# Patient Record
Sex: Female | Born: 1940 | Race: White | Hispanic: No | Marital: Married | Smoking: Never smoker
Health system: Southern US, Community
[De-identification: ages and names within clinical notes are randomized; demographics above are authoritative.]

## PROBLEM LIST (undated history)

## (undated) DIAGNOSIS — N189 Chronic kidney disease, unspecified: Secondary | ICD-10-CM

## (undated) DIAGNOSIS — Z8781 Personal history of (healed) traumatic fracture: Secondary | ICD-10-CM

## (undated) DIAGNOSIS — I509 Heart failure, unspecified: Secondary | ICD-10-CM

## (undated) DIAGNOSIS — I671 Cerebral aneurysm, nonruptured: Secondary | ICD-10-CM

## (undated) HISTORY — PX: TUBAL LIGATION: SHX77

---

## 2004-10-17 ENCOUNTER — Emergency Department: Payer: Self-pay | Admitting: Emergency Medicine

## 2004-10-17 ENCOUNTER — Other Ambulatory Visit: Payer: Self-pay

## 2012-09-27 DIAGNOSIS — S61409A Unspecified open wound of unspecified hand, initial encounter: Secondary | ICD-10-CM | POA: Diagnosis not present

## 2012-09-27 DIAGNOSIS — Z23 Encounter for immunization: Secondary | ICD-10-CM | POA: Diagnosis not present

## 2012-12-27 DIAGNOSIS — J069 Acute upper respiratory infection, unspecified: Secondary | ICD-10-CM | POA: Diagnosis not present

## 2012-12-27 DIAGNOSIS — Z23 Encounter for immunization: Secondary | ICD-10-CM | POA: Diagnosis not present

## 2012-12-27 DIAGNOSIS — B079 Viral wart, unspecified: Secondary | ICD-10-CM | POA: Diagnosis not present

## 2013-10-27 DIAGNOSIS — S335XXA Sprain of ligaments of lumbar spine, initial encounter: Secondary | ICD-10-CM | POA: Diagnosis not present

## 2013-10-27 DIAGNOSIS — Z23 Encounter for immunization: Secondary | ICD-10-CM | POA: Diagnosis not present

## 2013-10-27 DIAGNOSIS — L988 Other specified disorders of the skin and subcutaneous tissue: Secondary | ICD-10-CM | POA: Diagnosis not present

## 2013-11-01 DIAGNOSIS — M999 Biomechanical lesion, unspecified: Secondary | ICD-10-CM | POA: Diagnosis not present

## 2013-11-01 DIAGNOSIS — M5430 Sciatica, unspecified side: Secondary | ICD-10-CM | POA: Diagnosis not present

## 2013-11-02 DIAGNOSIS — M999 Biomechanical lesion, unspecified: Secondary | ICD-10-CM | POA: Diagnosis not present

## 2013-11-02 DIAGNOSIS — M5430 Sciatica, unspecified side: Secondary | ICD-10-CM | POA: Diagnosis not present

## 2013-11-03 DIAGNOSIS — M5430 Sciatica, unspecified side: Secondary | ICD-10-CM | POA: Diagnosis not present

## 2013-11-03 DIAGNOSIS — M999 Biomechanical lesion, unspecified: Secondary | ICD-10-CM | POA: Diagnosis not present

## 2013-11-04 DIAGNOSIS — M999 Biomechanical lesion, unspecified: Secondary | ICD-10-CM | POA: Diagnosis not present

## 2013-11-04 DIAGNOSIS — M5430 Sciatica, unspecified side: Secondary | ICD-10-CM | POA: Diagnosis not present

## 2013-11-07 DIAGNOSIS — M5430 Sciatica, unspecified side: Secondary | ICD-10-CM | POA: Diagnosis not present

## 2013-11-07 DIAGNOSIS — M999 Biomechanical lesion, unspecified: Secondary | ICD-10-CM | POA: Diagnosis not present

## 2013-11-09 DIAGNOSIS — M999 Biomechanical lesion, unspecified: Secondary | ICD-10-CM | POA: Diagnosis not present

## 2013-11-09 DIAGNOSIS — M5430 Sciatica, unspecified side: Secondary | ICD-10-CM | POA: Diagnosis not present

## 2013-11-14 DIAGNOSIS — M999 Biomechanical lesion, unspecified: Secondary | ICD-10-CM | POA: Diagnosis not present

## 2013-11-14 DIAGNOSIS — M5430 Sciatica, unspecified side: Secondary | ICD-10-CM | POA: Diagnosis not present

## 2013-11-21 DIAGNOSIS — M999 Biomechanical lesion, unspecified: Secondary | ICD-10-CM | POA: Diagnosis not present

## 2013-11-21 DIAGNOSIS — M5430 Sciatica, unspecified side: Secondary | ICD-10-CM | POA: Diagnosis not present

## 2013-12-09 DIAGNOSIS — M5430 Sciatica, unspecified side: Secondary | ICD-10-CM | POA: Diagnosis not present

## 2013-12-09 DIAGNOSIS — M999 Biomechanical lesion, unspecified: Secondary | ICD-10-CM | POA: Diagnosis not present

## 2015-09-24 ENCOUNTER — Encounter: Payer: Self-pay | Admitting: Emergency Medicine

## 2015-09-24 ENCOUNTER — Emergency Department: Payer: No Typology Code available for payment source

## 2015-09-24 ENCOUNTER — Emergency Department
Admission: EM | Admit: 2015-09-24 | Discharge: 2015-09-24 | Payer: No Typology Code available for payment source | Attending: Emergency Medicine | Admitting: Emergency Medicine

## 2015-09-24 DIAGNOSIS — S0990XA Unspecified injury of head, initial encounter: Secondary | ICD-10-CM | POA: Diagnosis not present

## 2015-09-24 DIAGNOSIS — S51812A Laceration without foreign body of left forearm, initial encounter: Secondary | ICD-10-CM | POA: Diagnosis not present

## 2015-09-24 DIAGNOSIS — S12100A Unspecified displaced fracture of second cervical vertebra, initial encounter for closed fracture: Secondary | ICD-10-CM | POA: Insufficient documentation

## 2015-09-24 DIAGNOSIS — S299XXA Unspecified injury of thorax, initial encounter: Secondary | ICD-10-CM | POA: Diagnosis not present

## 2015-09-24 DIAGNOSIS — Y92410 Unspecified street and highway as the place of occurrence of the external cause: Secondary | ICD-10-CM | POA: Diagnosis not present

## 2015-09-24 DIAGNOSIS — Y33XXXA Other specified events, undetermined intent, initial encounter: Secondary | ICD-10-CM | POA: Diagnosis not present

## 2015-09-24 DIAGNOSIS — Z7401 Bed confinement status: Secondary | ICD-10-CM | POA: Diagnosis not present

## 2015-09-24 DIAGNOSIS — Z881 Allergy status to other antibiotic agents status: Secondary | ICD-10-CM | POA: Diagnosis not present

## 2015-09-24 DIAGNOSIS — Y939 Activity, unspecified: Secondary | ICD-10-CM | POA: Insufficient documentation

## 2015-09-24 DIAGNOSIS — M79632 Pain in left forearm: Secondary | ICD-10-CM | POA: Diagnosis not present

## 2015-09-24 DIAGNOSIS — S62502A Fracture of unspecified phalanx of left thumb, initial encounter for closed fracture: Secondary | ICD-10-CM | POA: Diagnosis not present

## 2015-09-24 DIAGNOSIS — Z885 Allergy status to narcotic agent status: Secondary | ICD-10-CM | POA: Diagnosis not present

## 2015-09-24 DIAGNOSIS — M79631 Pain in right forearm: Secondary | ICD-10-CM | POA: Diagnosis not present

## 2015-09-24 DIAGNOSIS — T1490 Injury, unspecified: Secondary | ICD-10-CM | POA: Diagnosis not present

## 2015-09-24 DIAGNOSIS — S129XXA Fracture of neck, unspecified, initial encounter: Secondary | ICD-10-CM | POA: Diagnosis not present

## 2015-09-24 DIAGNOSIS — M79644 Pain in right finger(s): Secondary | ICD-10-CM | POA: Diagnosis present

## 2015-09-24 DIAGNOSIS — Y999 Unspecified external cause status: Secondary | ICD-10-CM | POA: Insufficient documentation

## 2015-09-24 DIAGNOSIS — S12200A Unspecified displaced fracture of third cervical vertebra, initial encounter for closed fracture: Secondary | ICD-10-CM | POA: Diagnosis not present

## 2015-09-24 DIAGNOSIS — S12120A Other displaced dens fracture, initial encounter for closed fracture: Secondary | ICD-10-CM

## 2015-09-24 DIAGNOSIS — S59912A Unspecified injury of left forearm, initial encounter: Secondary | ICD-10-CM | POA: Diagnosis not present

## 2015-09-24 DIAGNOSIS — S62619A Displaced fracture of proximal phalanx of unspecified finger, initial encounter for closed fracture: Secondary | ICD-10-CM

## 2015-09-24 DIAGNOSIS — S6992XA Unspecified injury of left wrist, hand and finger(s), initial encounter: Secondary | ICD-10-CM | POA: Diagnosis not present

## 2015-09-24 DIAGNOSIS — R0902 Hypoxemia: Secondary | ICD-10-CM | POA: Diagnosis not present

## 2015-09-24 DIAGNOSIS — M542 Cervicalgia: Secondary | ICD-10-CM | POA: Diagnosis not present

## 2015-09-24 DIAGNOSIS — Z9181 History of falling: Secondary | ICD-10-CM | POA: Diagnosis not present

## 2015-09-24 DIAGNOSIS — R51 Headache: Secondary | ICD-10-CM | POA: Diagnosis not present

## 2015-09-24 DIAGNOSIS — S62512A Displaced fracture of proximal phalanx of left thumb, initial encounter for closed fracture: Secondary | ICD-10-CM | POA: Diagnosis not present

## 2015-09-24 DIAGNOSIS — S59911A Unspecified injury of right forearm, initial encounter: Secondary | ICD-10-CM | POA: Diagnosis not present

## 2015-09-24 DIAGNOSIS — S12110A Anterior displaced Type II dens fracture, initial encounter for closed fracture: Secondary | ICD-10-CM | POA: Diagnosis not present

## 2015-09-24 DIAGNOSIS — Z880 Allergy status to penicillin: Secondary | ICD-10-CM | POA: Diagnosis not present

## 2015-09-24 DIAGNOSIS — R079 Chest pain, unspecified: Secondary | ICD-10-CM | POA: Diagnosis not present

## 2015-09-24 LAB — PROTIME-INR
INR: 1.02
PROTHROMBIN TIME: 13.6 s (ref 11.4–15.0)

## 2015-09-24 LAB — CBC
HCT: 39.7 % (ref 35.0–47.0)
HEMOGLOBIN: 14 g/dL (ref 12.0–16.0)
MCH: 33 pg (ref 26.0–34.0)
MCHC: 35.1 g/dL (ref 32.0–36.0)
MCV: 94.1 fL (ref 80.0–100.0)
Platelets: 182 10*3/uL (ref 150–440)
RBC: 4.23 MIL/uL (ref 3.80–5.20)
RDW: 13.7 % (ref 11.5–14.5)
WBC: 10.3 10*3/uL (ref 3.6–11.0)

## 2015-09-24 LAB — APTT: APTT: 28 s (ref 24–36)

## 2015-09-24 LAB — BASIC METABOLIC PANEL
ANION GAP: 8 (ref 5–15)
BUN: 11 mg/dL (ref 6–20)
CHLORIDE: 108 mmol/L (ref 101–111)
CO2: 23 mmol/L (ref 22–32)
Calcium: 8.8 mg/dL — ABNORMAL LOW (ref 8.9–10.3)
Creatinine, Ser: 0.84 mg/dL (ref 0.44–1.00)
Glucose, Bld: 115 mg/dL — ABNORMAL HIGH (ref 65–99)
POTASSIUM: 3.5 mmol/L (ref 3.5–5.1)
SODIUM: 139 mmol/L (ref 135–145)

## 2015-09-24 MED ORDER — TRIPLE ANTIBIOTIC 3.5-400-5000 EX OINT
TOPICAL_OINTMENT | Freq: Once | CUTANEOUS | Status: DC
  Filled 2015-09-24: qty 15

## 2015-09-24 MED ORDER — LIDOCAINE HCL (PF) 1 % IJ SOLN
5.0000 mL | Freq: Once | INTRAMUSCULAR | Status: AC
  Administered 2015-09-24: 5 mL

## 2015-09-24 MED ORDER — LACTATED RINGERS IV SOLN
INTRAVENOUS | Status: DC
  Administered 2015-09-24: 15:00:00 via INTRAVENOUS

## 2015-09-24 MED ORDER — BACITRACIN ZINC 500 UNIT/GM EX OINT
TOPICAL_OINTMENT | CUTANEOUS | Status: AC
  Filled 2015-09-24: qty 2.7

## 2015-09-24 MED ORDER — LIDOCAINE HCL (PF) 1 % IJ SOLN
INTRAMUSCULAR | Status: AC
  Administered 2015-09-24: 5 mL
  Filled 2015-09-24: qty 5

## 2015-09-24 MED ORDER — TETANUS-DIPHTH-ACELL PERTUSSIS 5-2.5-18.5 LF-MCG/0.5 IM SUSP
0.5000 mL | Freq: Once | INTRAMUSCULAR | Status: AC
  Administered 2015-09-24: 0.5 mL via INTRAMUSCULAR
  Filled 2015-09-24: qty 0.5

## 2015-09-24 MED ORDER — BACITRACIN ZINC 500 UNIT/GM EX OINT
TOPICAL_OINTMENT | Freq: Two times a day (BID) | CUTANEOUS | Status: DC
  Administered 2015-09-24: 13:00:00 via TOPICAL

## 2015-09-24 NOTE — ED Notes (Signed)
Pt placed on backboard.
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
## 2015-09-24 NOTE — ED Notes (Signed)
Placed pt on bed pan to urinate with the assistance of Estate agent.

## 2015-09-24 NOTE — ED Notes (Signed)
Pt placed in c-collar
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
## 2015-09-25 DIAGNOSIS — M47814 Spondylosis without myelopathy or radiculopathy, thoracic region: Secondary | ICD-10-CM | POA: Diagnosis not present

## 2015-09-25 DIAGNOSIS — S299XXA Unspecified injury of thorax, initial encounter: Secondary | ICD-10-CM | POA: Diagnosis not present

## 2015-09-28 ENCOUNTER — Telehealth: Payer: Self-pay | Admitting: Family Medicine

## 2015-09-28 DIAGNOSIS — S12120D Other displaced dens fracture, subsequent encounter for fracture with routine healing: Secondary | ICD-10-CM | POA: Diagnosis not present

## 2015-09-28 DIAGNOSIS — M6281 Muscle weakness (generalized): Secondary | ICD-10-CM | POA: Diagnosis not present

## 2015-09-28 DIAGNOSIS — S62502D Fracture of unspecified phalanx of left thumb, subsequent encounter for fracture with routine healing: Secondary | ICD-10-CM | POA: Diagnosis not present

## 2015-09-28 DIAGNOSIS — Z9181 History of falling: Secondary | ICD-10-CM | POA: Diagnosis not present
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
## 2015-09-28 NOTE — Telephone Encounter (Signed)
Please review below, how would you like to proceed.-aa

## 2015-10-01 DIAGNOSIS — Z9181 History of falling: Secondary | ICD-10-CM | POA: Diagnosis not present

## 2015-10-01 DIAGNOSIS — M6281 Muscle weakness (generalized): Secondary | ICD-10-CM | POA: Diagnosis not present

## 2015-10-01 DIAGNOSIS — S62502D Fracture of unspecified phalanx of left thumb, subsequent encounter for fracture with routine healing: Secondary | ICD-10-CM | POA: Diagnosis not present

## 2015-10-01 DIAGNOSIS — S12120D Other displaced dens fracture, subsequent encounter for fracture with routine healing: Secondary | ICD-10-CM | POA: Diagnosis not present

## 2015-10-03 ENCOUNTER — Telehealth: Payer: Self-pay | Admitting: Family Medicine

## 2015-10-03 NOTE — Telephone Encounter (Signed)
She will have to be seen by someone else. I can't not taking any more new patients.
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
## 2015-10-03 NOTE — Telephone Encounter (Signed)
[name] advised. sd

## 2015-10-04 ENCOUNTER — Inpatient Hospital Stay: Payer: Self-pay | Admitting: Family Medicine

## 2015-10-04 DIAGNOSIS — Z9181 History of falling: Secondary | ICD-10-CM | POA: Diagnosis not present

## 2015-10-04 DIAGNOSIS — S62502D Fracture of unspecified phalanx of left thumb, subsequent encounter for fracture with routine healing: Secondary | ICD-10-CM | POA: Diagnosis not present

## 2015-10-04 DIAGNOSIS — M6281 Muscle weakness (generalized): Secondary | ICD-10-CM | POA: Diagnosis not present

## 2015-10-04 DIAGNOSIS — S12120D Other displaced dens fracture, subsequent encounter for fracture with routine healing: Secondary | ICD-10-CM | POA: Diagnosis not present

## 2015-10-05 DIAGNOSIS — S62512A Displaced fracture of proximal phalanx of left thumb, initial encounter for closed fracture: Secondary | ICD-10-CM | POA: Diagnosis not present

## 2015-10-05 DIAGNOSIS — Y33XXXA Other specified events, undetermined intent, initial encounter: Secondary | ICD-10-CM | POA: Diagnosis not present

## 2015-10-05 DIAGNOSIS — M79645 Pain in left finger(s): Secondary | ICD-10-CM | POA: Diagnosis not present

## 2015-10-08 DIAGNOSIS — R0602 Shortness of breath: Secondary | ICD-10-CM | POA: Diagnosis not present

## 2015-10-08 DIAGNOSIS — Z01818 Encounter for other preprocedural examination: Secondary | ICD-10-CM | POA: Diagnosis not present

## 2015-10-08 DIAGNOSIS — S62512A Displaced fracture of proximal phalanx of left thumb, initial encounter for closed fracture: Secondary | ICD-10-CM | POA: Diagnosis not present

## 2015-10-08 DIAGNOSIS — S12120S Other displaced dens fracture, sequela: Secondary | ICD-10-CM | POA: Diagnosis not present

## 2015-10-08 DIAGNOSIS — R0902 Hypoxemia: Secondary | ICD-10-CM | POA: Diagnosis not present

## 2015-10-08 DIAGNOSIS — E669 Obesity, unspecified: Secondary | ICD-10-CM | POA: Diagnosis not present

## 2015-10-08 DIAGNOSIS — G4730 Sleep apnea, unspecified: Secondary | ICD-10-CM | POA: Diagnosis not present

## 2015-10-09 DIAGNOSIS — M6281 Muscle weakness (generalized): Secondary | ICD-10-CM | POA: Diagnosis not present

## 2015-10-09 DIAGNOSIS — Z9181 History of falling: Secondary | ICD-10-CM | POA: Diagnosis not present

## 2015-10-09 DIAGNOSIS — S12120D Other displaced dens fracture, subsequent encounter for fracture with routine healing: Secondary | ICD-10-CM | POA: Diagnosis not present

## 2015-10-09 DIAGNOSIS — S62502D Fracture of unspecified phalanx of left thumb, subsequent encounter for fracture with routine healing: Secondary | ICD-10-CM | POA: Diagnosis not present

## 2015-10-10 DIAGNOSIS — M542 Cervicalgia: Secondary | ICD-10-CM | POA: Diagnosis not present

## 2015-10-10 DIAGNOSIS — Y33XXXS Other specified events, undetermined intent, sequela: Secondary | ICD-10-CM | POA: Diagnosis not present

## 2015-10-10 DIAGNOSIS — S12120S Other displaced dens fracture, sequela: Secondary | ICD-10-CM | POA: Diagnosis not present

## 2015-10-10 DIAGNOSIS — S12121D Other nondisplaced dens fracture, subsequent encounter for fracture with routine healing: Secondary | ICD-10-CM | POA: Diagnosis not present

## 2015-10-10 DIAGNOSIS — S12120D Other displaced dens fracture, subsequent encounter for fracture with routine healing: Secondary | ICD-10-CM | POA: Diagnosis not present

## 2015-10-10 DIAGNOSIS — M47812 Spondylosis without myelopathy or radiculopathy, cervical region: Secondary | ICD-10-CM | POA: Diagnosis not present

## 2015-10-10 DIAGNOSIS — Z87891 Personal history of nicotine dependence: Secondary | ICD-10-CM | POA: Diagnosis not present

## 2015-10-11 DIAGNOSIS — S62502D Fracture of unspecified phalanx of left thumb, subsequent encounter for fracture with routine healing: Secondary | ICD-10-CM | POA: Diagnosis not present

## 2015-10-11 DIAGNOSIS — S12120D Other displaced dens fracture, subsequent encounter for fracture with routine healing: Secondary | ICD-10-CM | POA: Diagnosis not present

## 2015-10-11 DIAGNOSIS — Z9181 History of falling: Secondary | ICD-10-CM | POA: Diagnosis not present

## 2015-10-11 DIAGNOSIS — M6281 Muscle weakness (generalized): Secondary | ICD-10-CM | POA: Diagnosis not present

## 2015-10-12 ENCOUNTER — Encounter (INDEPENDENT_AMBULATORY_CARE_PROVIDER_SITE_OTHER): Payer: Medicare Other | Admitting: Family Medicine

## 2015-10-12 DIAGNOSIS — M6281 Muscle weakness (generalized): Secondary | ICD-10-CM | POA: Diagnosis not present

## 2015-10-12 DIAGNOSIS — Z9181 History of falling: Secondary | ICD-10-CM | POA: Diagnosis not present

## 2015-10-12 DIAGNOSIS — Z01818 Encounter for other preprocedural examination: Secondary | ICD-10-CM | POA: Diagnosis not present

## 2015-10-12 DIAGNOSIS — S62502D Fracture of unspecified phalanx of left thumb, subsequent encounter for fracture with routine healing: Secondary | ICD-10-CM

## 2015-10-12 DIAGNOSIS — R0602 Shortness of breath: Secondary | ICD-10-CM | POA: Diagnosis not present

## 2015-10-16 DIAGNOSIS — S62512A Displaced fracture of proximal phalanx of left thumb, initial encounter for closed fracture: Secondary | ICD-10-CM | POA: Diagnosis not present

## 2015-10-16 DIAGNOSIS — Z87891 Personal history of nicotine dependence: Secondary | ICD-10-CM | POA: Diagnosis not present

## 2015-10-16 DIAGNOSIS — Z7982 Long term (current) use of aspirin: Secondary | ICD-10-CM | POA: Diagnosis not present

## 2015-10-16 DIAGNOSIS — Z79899 Other long term (current) drug therapy: Secondary | ICD-10-CM | POA: Diagnosis not present

## 2015-10-29 DIAGNOSIS — Z4789 Encounter for other orthopedic aftercare: Secondary | ICD-10-CM | POA: Diagnosis not present

## 2015-10-29 DIAGNOSIS — M25642 Stiffness of left hand, not elsewhere classified: Secondary | ICD-10-CM | POA: Diagnosis not present

## 2015-10-29 DIAGNOSIS — Y33XXXD Other specified events, undetermined intent, subsequent encounter: Secondary | ICD-10-CM | POA: Diagnosis not present

## 2015-10-29 DIAGNOSIS — S62512D Displaced fracture of proximal phalanx of left thumb, subsequent encounter for fracture with routine healing: Secondary | ICD-10-CM | POA: Diagnosis not present

## 2015-10-31 DIAGNOSIS — Z87891 Personal history of nicotine dependence: Secondary | ICD-10-CM | POA: Diagnosis not present

## 2015-10-31 DIAGNOSIS — M542 Cervicalgia: Secondary | ICD-10-CM | POA: Diagnosis not present

## 2015-10-31 DIAGNOSIS — S12120D Other displaced dens fracture, subsequent encounter for fracture with routine healing: Secondary | ICD-10-CM | POA: Diagnosis not present

## 2015-10-31 DIAGNOSIS — S12120S Other displaced dens fracture, sequela: Secondary | ICD-10-CM | POA: Diagnosis not present

## 2015-10-31 DIAGNOSIS — Y33XXXD Other specified events, undetermined intent, subsequent encounter: Secondary | ICD-10-CM | POA: Diagnosis not present

## 2015-10-31 DIAGNOSIS — S12110D Anterior displaced Type II dens fracture, subsequent encounter for fracture with routine healing: Secondary | ICD-10-CM | POA: Diagnosis not present

## 2015-10-31 DIAGNOSIS — M4322 Fusion of spine, cervical region: Secondary | ICD-10-CM | POA: Diagnosis not present

## 2015-11-01 DIAGNOSIS — R0902 Hypoxemia: Secondary | ICD-10-CM | POA: Diagnosis not present

## 2015-11-01 DIAGNOSIS — R350 Frequency of micturition: Secondary | ICD-10-CM | POA: Diagnosis not present

## 2015-11-01 DIAGNOSIS — R21 Rash and other nonspecific skin eruption: Secondary | ICD-10-CM | POA: Diagnosis not present

## 2015-11-01 DIAGNOSIS — Z7689 Persons encountering health services in other specified circumstances: Secondary | ICD-10-CM | POA: Diagnosis not present

## 2015-11-02 DIAGNOSIS — N390 Urinary tract infection, site not specified: Secondary | ICD-10-CM | POA: Diagnosis not present

## 2015-11-06 DIAGNOSIS — M4322 Fusion of spine, cervical region: Secondary | ICD-10-CM | POA: Diagnosis not present

## 2015-11-06 DIAGNOSIS — Y33XXXA Other specified events, undetermined intent, initial encounter: Secondary | ICD-10-CM | POA: Diagnosis not present

## 2015-11-06 DIAGNOSIS — S199XXA Unspecified injury of neck, initial encounter: Secondary | ICD-10-CM | POA: Diagnosis not present

## 2015-11-14 DIAGNOSIS — M1990 Unspecified osteoarthritis, unspecified site: Secondary | ICD-10-CM | POA: Diagnosis not present

## 2015-11-14 DIAGNOSIS — Y33XXXD Other specified events, undetermined intent, subsequent encounter: Secondary | ICD-10-CM | POA: Diagnosis not present

## 2015-11-14 DIAGNOSIS — S62512D Displaced fracture of proximal phalanx of left thumb, subsequent encounter for fracture with routine healing: Secondary | ICD-10-CM | POA: Diagnosis not present

## 2015-11-30 DIAGNOSIS — Z4789 Encounter for other orthopedic aftercare: Secondary | ICD-10-CM | POA: Diagnosis not present

## 2015-11-30 DIAGNOSIS — Y33XXXD Other specified events, undetermined intent, subsequent encounter: Secondary | ICD-10-CM | POA: Diagnosis not present

## 2015-11-30 DIAGNOSIS — M25642 Stiffness of left hand, not elsewhere classified: Secondary | ICD-10-CM | POA: Diagnosis not present

## 2015-11-30 DIAGNOSIS — S62512D Displaced fracture of proximal phalanx of left thumb, subsequent encounter for fracture with routine healing: Secondary | ICD-10-CM | POA: Diagnosis not present

## 2015-11-30 DIAGNOSIS — M85842 Other specified disorders of bone density and structure, left hand: Secondary | ICD-10-CM | POA: Diagnosis not present

## 2015-12-07 DIAGNOSIS — Z7689 Persons encountering health services in other specified circumstances: Secondary | ICD-10-CM | POA: Diagnosis not present

## 2015-12-18 DIAGNOSIS — G4730 Sleep apnea, unspecified: Secondary | ICD-10-CM | POA: Diagnosis not present

## 2015-12-18 DIAGNOSIS — R21 Rash and other nonspecific skin eruption: Secondary | ICD-10-CM | POA: Diagnosis not present

## 2015-12-21 DIAGNOSIS — Z4789 Encounter for other orthopedic aftercare: Secondary | ICD-10-CM | POA: Diagnosis not present

## 2015-12-21 DIAGNOSIS — S62512D Displaced fracture of proximal phalanx of left thumb, subsequent encounter for fracture with routine healing: Secondary | ICD-10-CM | POA: Diagnosis not present

## 2015-12-21 DIAGNOSIS — M25642 Stiffness of left hand, not elsewhere classified: Secondary | ICD-10-CM | POA: Diagnosis not present

## 2015-12-26 DIAGNOSIS — S12120D Other displaced dens fracture, subsequent encounter for fracture with routine healing: Secondary | ICD-10-CM | POA: Diagnosis not present

## 2015-12-26 DIAGNOSIS — S12120S Other displaced dens fracture, sequela: Secondary | ICD-10-CM | POA: Diagnosis not present

## 2015-12-26 DIAGNOSIS — M542 Cervicalgia: Secondary | ICD-10-CM | POA: Diagnosis not present

## 2016-01-09 DIAGNOSIS — Y33XXXD Other specified events, undetermined intent, subsequent encounter: Secondary | ICD-10-CM | POA: Diagnosis not present

## 2016-01-09 DIAGNOSIS — M25649 Stiffness of unspecified hand, not elsewhere classified: Secondary | ICD-10-CM | POA: Diagnosis not present

## 2016-01-09 DIAGNOSIS — S62512D Displaced fracture of proximal phalanx of left thumb, subsequent encounter for fracture with routine healing: Secondary | ICD-10-CM | POA: Diagnosis not present

## 2016-03-16 ENCOUNTER — Emergency Department
Admission: EM | Admit: 2016-03-16 | Discharge: 2016-03-16 | Disposition: A | Discharge status: Home / Self Care | Payer: Medicare Other | Attending: Student in an Organized Health Care Education/Training Program | Admitting: Student in an Organized Health Care Education/Training Program

## 2016-03-16 ENCOUNTER — Encounter: Payer: Self-pay | Admitting: Emergency Medicine

## 2016-03-16 ENCOUNTER — Emergency Department: Payer: Medicare Other

## 2016-03-16 DIAGNOSIS — Z8679 Personal history of other diseases of the circulatory system: Secondary | ICD-10-CM | POA: Diagnosis not present

## 2016-03-16 DIAGNOSIS — I10 Essential (primary) hypertension: Secondary | ICD-10-CM | POA: Diagnosis not present

## 2016-03-16 DIAGNOSIS — R42 Dizziness and giddiness: Secondary | ICD-10-CM | POA: Diagnosis not present

## 2016-03-16 HISTORY — DX: Cerebral aneurysm, nonruptured: I67.1

## 2016-03-16 HISTORY — DX: Personal history of (healed) traumatic fracture: Z87.81

## 2016-03-16 LAB — CBC
HEMATOCRIT: 39.6 % (ref 35.0–47.0)
Hemoglobin: 13.7 g/dL (ref 12.0–16.0)
MCH: 31.9 pg (ref 26.0–34.0)
MCHC: 34.7 g/dL (ref 32.0–36.0)
MCV: 92.1 fL (ref 80.0–100.0)
Platelets: 224 10*3/uL (ref 150–440)
RBC: 4.3 MIL/uL (ref 3.80–5.20)
RDW: 13.7 % (ref 11.5–14.5)
WBC: 6.5 10*3/uL (ref 3.6–11.0)

## 2016-03-16 LAB — URINALYSIS, COMPLETE (UACMP) WITH MICROSCOPIC
Bilirubin Urine: NEGATIVE
Glucose, UA: NEGATIVE mg/dL
Hgb urine dipstick: NEGATIVE
Ketones, ur: NEGATIVE mg/dL
Leukocytes, UA: NEGATIVE
Nitrite: NEGATIVE
PH: 6 (ref 5.0–8.0)
Protein, ur: NEGATIVE mg/dL
SPECIFIC GRAVITY, URINE: 1.006 (ref 1.005–1.030)

## 2016-03-16 LAB — BASIC METABOLIC PANEL
Anion gap: 7 (ref 5–15)
BUN: 10 mg/dL (ref 6–20)
CHLORIDE: 108 mmol/L (ref 101–111)
CO2: 23 mmol/L (ref 22–32)
CREATININE: 0.93 mg/dL (ref 0.44–1.00)
Calcium: 8.6 mg/dL — ABNORMAL LOW (ref 8.9–10.3)
GFR calc Af Amer: >60 mL/min (ref >60)
GFR calc non Af Amer: 58 mL/min — ABNORMAL LOW (ref >60)
GLUCOSE: 162 mg/dL — AB (ref 65–99)
POTASSIUM: 3.3 mmol/L — AB (ref 3.5–5.1)
Sodium: 138 mmol/L (ref 135–145)

## 2016-03-16 LAB — TROPONIN I: Troponin I: <0.03 ng/mL (ref <0.03)

## 2016-03-16 MED ORDER — AMLODIPINE BESYLATE 5 MG PO TABS: 5.0000 mg | ORAL_TABLET | Freq: Every day | ORAL | 0 refills | Status: DC

## 2016-03-16 NOTE — ED Triage Notes (Addendum)
Pt has had elevated blood pressures today ranging 832-919 systolic. No history high blood pressure.  Over past week has had dizzy spells per daughter with elevated blood pressures.  Has had headaches on and off over last week as well.  Denies CP/SHOB.

## 2016-03-16 NOTE — ED Provider Notes (Signed)
Carol Kent Provider Note    First MD Initiated Contact with Patient 03/16/16 1924     (approximate)  I have reviewed the triage vital signs and the nursing notes.   HISTORY  Chief Complaint Hypertension    HPI ANALISE Carol Kent is a 75 y.o. female with a history of secured brain aneurysm presents with very vague dizzy symptoms for the past several days. States that she is also having intermittent headaches and sinus congestion. Has been taking Mucinex. States that they checked her blood pressure and it was elevated to the 200s. She denies any chest pain or shortness of breath. No orthopnea. Denies any numbness or tingling. At this time she denies any complaints. States that she has trouble describing the symptoms that she's having   Past Medical History:  Diagnosis Date  . Brain aneurysm   . History of cervical fracture    History reviewed. No pertinent family history. History reviewed. No pertinent surgical history. There are no active problems to display for this patient.     Prior to Admission medications   Not on File    Allergies Other and Penicillins    Social History Social History  Substance Use Topics  . Smoking status: Never Smoker  . Smokeless tobacco: Never Used  . Alcohol use No    Review of Systems Patient denies headaches, rhinorrhea, blurry vision, numbness, shortness of breath, chest pain, edema, cough, abdominal pain, nausea, vomiting, diarrhea, dysuria, fevers, rashes or hallucinations unless otherwise stated above in HPI. ____________________________________________   PHYSICAL EXAM:  VITAL SIGNS: Vitals:   03/16/16 1651  BP: (!) 172/60  Pulse: 93  Resp: 18  Temp: 98.1 F (36.7 C)    Constitutional: Alert and oriented. Well appearing and in no acute distress. Eyes: Conjunctivae are normal. PERRL. EOMI. Head: Atraumatic. Nose: No congestion/rhinnorhea. Mouth/Throat: Mucous membranes  are moist.  Oropharynx non-erythematous. Neck: No stridor. Painless ROM. No cervical spine tenderness to palpation Hematological/Lymphatic/Immunilogical: No cervical lymphadenopathy. Cardiovascular: Normal rate, regular rhythm. Grossly normal heart sounds.  Good peripheral circulation. Respiratory: Normal respiratory effort.  No retractions. Lungs CTAB. Gastrointestinal: Soft and nontender. No distention. No abdominal bruits. No CVA tenderness. Genitourinary:  Musculoskeletal: No lower extremity tenderness nor edema.  No joint effusions. Neurologic:  CN- intact.  No facial droop, Normal FNF.  Normal heel to shin.  Sensation intact bilaterally. Normal speech and language. No gross focal neurologic deficits are appreciated. No gait instability.  Skin:  Skin is warm, dry and intact. No rash noted. Psychiatric: Mood and affect are normal. Speech and behavior are normal.  ____________________________________________   LABS (all labs ordered are listed, but only abnormal results are displayed)  Results for orders placed or performed during the hospital encounter of 03/16/16 (from the past 24 hour(s))  Basic metabolic panel     Status: Abnormal   Collection Time: 03/16/16  4:54 PM  Result Value Ref Range   Sodium 138 135 - 145 mmol/L   Potassium 3.3 (L) 3.5 - 5.1 mmol/L   Chloride 108 101 - 111 mmol/L   CO2 23 22 - 32 mmol/L   Glucose, Bld 162 (H) 65 - 99 mg/dL   BUN 10 6 - 20 mg/dL   Creatinine, Ser 0.93 0.44 - 1.00 mg/dL   Calcium 8.6 (L) 8.9 - 10.3 mg/dL   GFR calc non Af Amer 58 (L) >60 mL/min   GFR calc Af Amer >60 >60 mL/min   Anion gap 7 5 - 15  CBC     Status: None   Collection Time: 03/16/16  4:54 PM  Result Value Ref Range   WBC 6.5 3.6 - 11.0 K/uL   RBC 4.30 3.80 - 5.20 MIL/uL   Hemoglobin 13.7 12.0 - 16.0 g/dL   HCT 39.6 35.0 - 47.0 %   MCV 92.1 80.0 - 100.0 fL   MCH 31.9 26.0 - 34.0 pg   MCHC 34.7 32.0 - 36.0 g/dL   RDW 13.7 11.5 - 14.5 %   Platelets 224 150 - 440  K/uL  Urinalysis, Complete w Microscopic     Status: Abnormal   Collection Time: 03/16/16  4:54 PM  Result Value Ref Range   Color, Urine STRAW (A) YELLOW   APPearance CLEAR (A) CLEAR   Specific Gravity, Urine 1.006 1.005 - 1.030   pH 6.0 5.0 - 8.0   Glucose, UA NEGATIVE NEGATIVE mg/dL   Hgb urine dipstick NEGATIVE NEGATIVE   Bilirubin Urine NEGATIVE NEGATIVE   Ketones, ur NEGATIVE NEGATIVE mg/dL   Protein, ur NEGATIVE NEGATIVE mg/dL   Nitrite NEGATIVE NEGATIVE   Leukocytes, UA NEGATIVE NEGATIVE   RBC / HPF 0-5 0 - 5 RBC/hpf   WBC, UA 0-5 0 - 5 WBC/hpf   Bacteria, UA RARE (A) NONE SEEN   Squamous Epithelial / LPF 0-5 (A) NONE SEEN   ____________________________________________  EKG My review and personal interpretation at Time: 17:09   Indication: dizziness  Rate: 90  Rhythm: sinus Axis: normal Other: normal intervals no acute ischemia ____________________________________________  RADIOLOGY  I personally reviewed all radiographic images ordered to evaluate for the above acute complaints and reviewed radiology reports and findings.  These findings were personally discussed with the patient.  Please see medical record for radiology report.  ____________________________________________   PROCEDURES  Procedure(s) performed: none Procedures    Critical Care performed: no ____________________________________________   INITIAL IMPRESSION / ASSESSMENT AND PLAN / ED COURSE  Pertinent labs & imaging results that were available during my care of the patient were reviewed by me and considered in my medical decision making (see chart for details).  DDX: Dehydration, UTI, dysrhythmia, CVA, pneumonia, sepsis  Carol Kent is a 75 y.o. who presents to the ED with above vague symptoms and concern for elevated blood pressure. She denies any acute chest pain or shortness of breath. She is currently asymptomatic.Carol Kent Blood work was ordered in triage to evaluate for the above  complaints shows no evidence of acute renal failure. She does not have any evidence of end organ damage to her kidneys or heart or lungs. Based on her history of previous aneurysm and complaints will order CT imaging to evaluate for any evidence of mass effect or subdural hematoma.  Clinical Course as of Mar 16 2353  Nancy Fetter Mar 16, 2016  2124 Patient remains asymptomatic at this time.  There is no evidence of acute end organ damage.  Patient able to follow up with PCP in AM.  Patient was able to tolerate PO and was able to ambulate with a steady gait.  Have discussed with the patient and available family all diagnostics and treatments performed thus far and all questions were answered to the best of my ability. The patient demonstrates understanding and agreement with plan.   [PR]    Clinical Course User Index [PR] Merlyn Lot, MD     ____________________________________________   FINAL CLINICAL IMPRESSION(S) / ED DIAGNOSES  Final diagnoses:  Hypertension, unspecified type      NEW MEDICATIONS STARTED DURING  THIS VISIT:  New Prescriptions   No medications on file     Note:  This document was prepared using Dragon voice recognition software and may include unintentional dictation errors.    Merlyn Lot, MD 03/16/16 [phone]

## 2016-03-17 DIAGNOSIS — I10 Essential (primary) hypertension: Secondary | ICD-10-CM | POA: Diagnosis not present

## 2016-03-17 DIAGNOSIS — R42 Dizziness and giddiness: Secondary | ICD-10-CM | POA: Diagnosis not present

## 2016-03-19 ENCOUNTER — Other Ambulatory Visit: Payer: Self-pay | Admitting: Adult Health

## 2016-03-19 DIAGNOSIS — M542 Cervicalgia: Secondary | ICD-10-CM

## 2016-03-19 DIAGNOSIS — R42 Dizziness and giddiness: Secondary | ICD-10-CM

## 2016-03-21 ENCOUNTER — Ambulatory Visit
Admission: RE | Admit: 2016-03-21 | Discharge: 2016-03-21 | Disposition: A | Discharge status: Home / Self Care | Payer: Medicare Other | Source: Ambulatory Visit | Attending: Adult Health | Admitting: Adult Health

## 2016-03-21 DIAGNOSIS — R42 Dizziness and giddiness: Secondary | ICD-10-CM | POA: Diagnosis not present

## 2016-03-21 DIAGNOSIS — I6523 Occlusion and stenosis of bilateral carotid arteries: Secondary | ICD-10-CM | POA: Diagnosis not present

## 2016-03-21 DIAGNOSIS — M542 Cervicalgia: Secondary | ICD-10-CM

## 2016-03-29 DIAGNOSIS — R9431 Abnormal electrocardiogram [ECG] [EKG]: Secondary | ICD-10-CM | POA: Diagnosis not present

## 2016-03-29 DIAGNOSIS — Z87891 Personal history of nicotine dependence: Secondary | ICD-10-CM | POA: Diagnosis not present

## 2016-03-29 DIAGNOSIS — G4730 Sleep apnea, unspecified: Secondary | ICD-10-CM | POA: Diagnosis not present

## 2016-03-29 DIAGNOSIS — H02401 Unspecified ptosis of right eyelid: Secondary | ICD-10-CM | POA: Diagnosis not present

## 2016-03-29 DIAGNOSIS — G4733 Obstructive sleep apnea (adult) (pediatric): Secondary | ICD-10-CM | POA: Diagnosis not present

## 2016-03-29 DIAGNOSIS — Z79899 Other long term (current) drug therapy: Secondary | ICD-10-CM | POA: Diagnosis not present

## 2016-03-29 DIAGNOSIS — I10 Essential (primary) hypertension: Secondary | ICD-10-CM | POA: Diagnosis not present

## 2016-03-29 DIAGNOSIS — H532 Diplopia: Secondary | ICD-10-CM | POA: Diagnosis not present

## 2016-03-29 DIAGNOSIS — Z880 Allergy status to penicillin: Secondary | ICD-10-CM | POA: Diagnosis not present

## 2016-03-29 DIAGNOSIS — Z881 Allergy status to other antibiotic agents status: Secondary | ICD-10-CM | POA: Diagnosis not present

## 2016-03-29 DIAGNOSIS — Z885 Allergy status to narcotic agent status: Secondary | ICD-10-CM | POA: Diagnosis not present

## 2016-03-29 DIAGNOSIS — R2990 Unspecified symptoms and signs involving the nervous system: Secondary | ICD-10-CM | POA: Diagnosis not present

## 2016-03-29 DIAGNOSIS — R42 Dizziness and giddiness: Secondary | ICD-10-CM | POA: Diagnosis not present

## 2016-03-29 DIAGNOSIS — Z8781 Personal history of (healed) traumatic fracture: Secondary | ICD-10-CM | POA: Diagnosis not present

## 2016-03-29 DIAGNOSIS — I69898 Other sequelae of other cerebrovascular disease: Secondary | ICD-10-CM | POA: Diagnosis not present

## 2016-03-29 DIAGNOSIS — R112 Nausea with vomiting, unspecified: Secondary | ICD-10-CM | POA: Diagnosis not present

## 2016-03-29 DIAGNOSIS — I6521 Occlusion and stenosis of right carotid artery: Secondary | ICD-10-CM | POA: Diagnosis not present

## 2016-03-29 DIAGNOSIS — Z7982 Long term (current) use of aspirin: Secondary | ICD-10-CM | POA: Diagnosis not present

## 2016-03-29 DIAGNOSIS — G9389 Other specified disorders of brain: Secondary | ICD-10-CM | POA: Diagnosis not present

## 2016-04-02 DIAGNOSIS — R42 Dizziness and giddiness: Secondary | ICD-10-CM | POA: Diagnosis not present

## 2016-04-02 DIAGNOSIS — I6521 Occlusion and stenosis of right carotid artery: Secondary | ICD-10-CM | POA: Diagnosis not present

## 2016-04-02 DIAGNOSIS — I779 Disorder of arteries and arterioles, unspecified: Secondary | ICD-10-CM | POA: Diagnosis not present

## 2016-04-15 DIAGNOSIS — R0681 Apnea, not elsewhere classified: Secondary | ICD-10-CM | POA: Diagnosis not present

## 2016-04-15 DIAGNOSIS — H9311 Tinnitus, right ear: Secondary | ICD-10-CM | POA: Diagnosis not present

## 2016-04-15 DIAGNOSIS — I10 Essential (primary) hypertension: Secondary | ICD-10-CM | POA: Diagnosis not present

## 2016-04-22 DIAGNOSIS — H9313 Tinnitus, bilateral: Secondary | ICD-10-CM | POA: Diagnosis not present

## 2016-04-22 DIAGNOSIS — H8109 Meniere's disease, unspecified ear: Secondary | ICD-10-CM | POA: Diagnosis not present

## 2016-04-22 DIAGNOSIS — H903 Sensorineural hearing loss, bilateral: Secondary | ICD-10-CM | POA: Diagnosis not present

## 2016-04-22 DIAGNOSIS — R42 Dizziness and giddiness: Secondary | ICD-10-CM | POA: Diagnosis not present

## 2016-04-22 DIAGNOSIS — H9319 Tinnitus, unspecified ear: Secondary | ICD-10-CM | POA: Diagnosis not present

## 2016-05-02 DIAGNOSIS — R42 Dizziness and giddiness: Secondary | ICD-10-CM | POA: Diagnosis not present

## 2016-05-05 DIAGNOSIS — H8109 Meniere's disease, unspecified ear: Secondary | ICD-10-CM | POA: Diagnosis not present

## 2016-06-25 DIAGNOSIS — M542 Cervicalgia: Secondary | ICD-10-CM | POA: Diagnosis not present

## 2016-06-25 DIAGNOSIS — S12110D Anterior displaced Type II dens fracture, subsequent encounter for fracture with routine healing: Secondary | ICD-10-CM | POA: Diagnosis not present

## 2016-06-25 DIAGNOSIS — Y33XXXD Other specified events, undetermined intent, subsequent encounter: Secondary | ICD-10-CM | POA: Diagnosis not present

## 2016-06-25 DIAGNOSIS — S62522D Displaced fracture of distal phalanx of left thumb, subsequent encounter for fracture with routine healing: Secondary | ICD-10-CM | POA: Diagnosis not present

## 2016-06-25 DIAGNOSIS — S62512D Displaced fracture of proximal phalanx of left thumb, subsequent encounter for fracture with routine healing: Secondary | ICD-10-CM | POA: Diagnosis not present

## 2016-07-16 DIAGNOSIS — I10 Essential (primary) hypertension: Secondary | ICD-10-CM | POA: Diagnosis not present

## 2016-07-16 DIAGNOSIS — R0681 Apnea, not elsewhere classified: Secondary | ICD-10-CM | POA: Diagnosis not present

## 2016-08-22 DIAGNOSIS — T148XXA Other injury of unspecified body region, initial encounter: Secondary | ICD-10-CM | POA: Diagnosis not present

## 2016-08-22 DIAGNOSIS — Z9181 History of falling: Secondary | ICD-10-CM | POA: Diagnosis not present

## 2016-09-29 DIAGNOSIS — I779 Disorder of arteries and arterioles, unspecified: Secondary | ICD-10-CM | POA: Diagnosis not present

## 2016-09-29 DIAGNOSIS — I6521 Occlusion and stenosis of right carotid artery: Secondary | ICD-10-CM | POA: Diagnosis not present

## 2016-11-06 DIAGNOSIS — H8109 Meniere's disease, unspecified ear: Secondary | ICD-10-CM | POA: Diagnosis not present

## 2016-11-06 DIAGNOSIS — H903 Sensorineural hearing loss, bilateral: Secondary | ICD-10-CM | POA: Diagnosis not present
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
## 2019-11-12 ENCOUNTER — Encounter: Payer: Self-pay | Admitting: Emergency Medicine

## 2019-11-12 ENCOUNTER — Other Ambulatory Visit: Payer: Self-pay

## 2019-11-12 DIAGNOSIS — Z7982 Long term (current) use of aspirin: Secondary | ICD-10-CM

## 2019-11-12 DIAGNOSIS — I129 Hypertensive chronic kidney disease with stage 1 through stage 4 chronic kidney disease, or unspecified chronic kidney disease: Secondary | ICD-10-CM | POA: Diagnosis present

## 2019-11-12 DIAGNOSIS — Z888 Allergy status to other drugs, medicaments and biological substances status: Secondary | ICD-10-CM

## 2019-11-12 DIAGNOSIS — Z8781 Personal history of (healed) traumatic fracture: Secondary | ICD-10-CM

## 2019-11-12 DIAGNOSIS — K5520 Angiodysplasia of colon without hemorrhage: Secondary | ICD-10-CM | POA: Diagnosis present

## 2019-11-12 DIAGNOSIS — Z880 Allergy status to penicillin: Secondary | ICD-10-CM

## 2019-11-12 DIAGNOSIS — R7401 Elevation of levels of liver transaminase levels: Secondary | ICD-10-CM | POA: Diagnosis present

## 2019-11-12 DIAGNOSIS — Z882 Allergy status to sulfonamides status: Secondary | ICD-10-CM

## 2019-11-12 DIAGNOSIS — M069 Rheumatoid arthritis, unspecified: Secondary | ICD-10-CM | POA: Diagnosis present

## 2019-11-12 DIAGNOSIS — Z20822 Contact with and (suspected) exposure to covid-19: Secondary | ICD-10-CM | POA: Diagnosis present

## 2019-11-12 DIAGNOSIS — R197 Diarrhea, unspecified: Secondary | ICD-10-CM | POA: Diagnosis present

## 2019-11-12 DIAGNOSIS — I671 Cerebral aneurysm, nonruptured: Secondary | ICD-10-CM | POA: Diagnosis present

## 2019-11-12 DIAGNOSIS — Z79899 Other long term (current) drug therapy: Secondary | ICD-10-CM

## 2019-11-12 DIAGNOSIS — Z881 Allergy status to other antibiotic agents status: Secondary | ICD-10-CM

## 2019-11-12 DIAGNOSIS — E876 Hypokalemia: Secondary | ICD-10-CM | POA: Diagnosis present

## 2019-11-12 DIAGNOSIS — Z885 Allergy status to narcotic agent status: Secondary | ICD-10-CM

## 2019-11-12 DIAGNOSIS — D519 Vitamin B12 deficiency anemia, unspecified: Secondary | ICD-10-CM | POA: Diagnosis present

## 2019-11-12 DIAGNOSIS — E860 Dehydration: Secondary | ICD-10-CM | POA: Diagnosis present

## 2019-11-12 DIAGNOSIS — D61818 Other pancytopenia: Secondary | ICD-10-CM | POA: Diagnosis present

## 2019-11-12 DIAGNOSIS — N179 Acute kidney failure, unspecified: Principal | ICD-10-CM | POA: Diagnosis present

## 2019-11-12 DIAGNOSIS — N184 Chronic kidney disease, stage 4 (severe): Secondary | ICD-10-CM | POA: Diagnosis present

## 2019-11-12 LAB — COMPREHENSIVE METABOLIC PANEL
ALT: 19 U/L (ref 0–44)
AST: 30 U/L (ref 15–41)
Albumin: 3.7 g/dL (ref 3.5–5.0)
Alkaline Phosphatase: 128 U/L — ABNORMAL HIGH (ref 38–126)
Anion gap: 12 (ref 5–15)
BUN: 17 mg/dL (ref 8–23)
CO2: 24 mmol/L (ref 22–32)
Calcium: 8.1 mg/dL — ABNORMAL LOW (ref 8.9–10.3)
Chloride: 97 mmol/L — ABNORMAL LOW (ref 98–111)
Creatinine, Ser: 2.35 mg/dL — ABNORMAL HIGH (ref 0.44–1.00)
GFR calc Af Amer: 22 mL/min — ABNORMAL LOW (ref >60)
GFR calc non Af Amer: 19 mL/min — ABNORMAL LOW (ref >60)
Glucose, Bld: 165 mg/dL — ABNORMAL HIGH (ref 70–99)
Potassium: 2.8 mmol/L — ABNORMAL LOW (ref 3.5–5.1)
Sodium: 133 mmol/L — ABNORMAL LOW (ref 135–145)
Total Bilirubin: 1.5 mg/dL — ABNORMAL HIGH (ref 0.3–1.2)
Total Protein: 6.7 g/dL (ref 6.5–8.1)

## 2019-11-12 LAB — CBC
HCT: 32.6 % — ABNORMAL LOW (ref 36.0–46.0)
Hemoglobin: 11.7 g/dL — ABNORMAL LOW (ref 12.0–15.0)
MCH: 31.2 pg (ref 26.0–34.0)
MCHC: 35.9 g/dL (ref 30.0–36.0)
MCV: 86.9 fL (ref 80.0–100.0)
Platelets: 197 10*3/uL (ref 150–400)
RBC: 3.75 MIL/uL — ABNORMAL LOW (ref 3.87–5.11)
RDW: 13.1 % (ref 11.5–15.5)
WBC: 8.8 10*3/uL (ref 4.0–10.5)
nRBC: 0 % (ref 0.0–0.2)

## 2019-11-12 LAB — MAGNESIUM: Magnesium: 1.8 mg/dL (ref 1.7–2.4)

## 2019-11-12 LAB — LIPASE, BLOOD: Lipase: 34 U/L (ref 11–51)

## 2019-11-12 MED ORDER — POTASSIUM CHLORIDE CRYS ER 20 MEQ PO TBCR
40.0000 meq | EXTENDED_RELEASE_TABLET | Freq: Once | ORAL | Status: AC
  Administered 2019-11-13: 40 meq via ORAL
  Filled 2019-11-12: qty 2

## 2019-11-12 MED ORDER — POTASSIUM CHLORIDE 10 MEQ/100ML IV SOLN
10.0000 meq | Freq: Once | INTRAVENOUS | Status: AC
  Administered 2019-11-13: 10 meq via INTRAVENOUS
  Filled 2019-11-12: qty 100

## 2019-11-12 MED ORDER — LACTATED RINGERS IV BOLUS
1000.0000 mL | Freq: Once | INTRAVENOUS | Status: AC
  Administered 2019-11-13: 1000 mL via INTRAVENOUS

## 2019-11-12 NOTE — ED Triage Notes (Signed)
Pt arrived via POV with reports of diarrhea for at least 4 weeks, pt has been seen by PCP within the last 2 weeks, states labs were drawn and that her kidney function was "off"  Pt reports some weakness as well. Pt states she gets a pain in her stomach and she can't make it to the bathroom.  Pt goes to Dr. Kary Kos for primary care.

## 2019-11-13 ENCOUNTER — Inpatient Hospital Stay
Admission: EM | Admit: 2019-11-13 | Discharge: 2019-11-17 | DRG: 683 | Disposition: A | Discharge status: Home Health | Payer: Medicare Other | Attending: Internal Medicine | Admitting: Internal Medicine

## 2019-11-13 ENCOUNTER — Encounter: Payer: Self-pay | Admitting: Internal Medicine

## 2019-11-13 ENCOUNTER — Observation Stay: Payer: Medicare Other

## 2019-11-13 DIAGNOSIS — D61818 Other pancytopenia: Secondary | ICD-10-CM

## 2019-11-13 DIAGNOSIS — Z8781 Personal history of (healed) traumatic fracture: Secondary | ICD-10-CM | POA: Diagnosis not present

## 2019-11-13 DIAGNOSIS — R531 Weakness: Secondary | ICD-10-CM

## 2019-11-13 DIAGNOSIS — E876 Hypokalemia: Secondary | ICD-10-CM | POA: Diagnosis present

## 2019-11-13 DIAGNOSIS — D519 Vitamin B12 deficiency anemia, unspecified: Secondary | ICD-10-CM

## 2019-11-13 DIAGNOSIS — E860 Dehydration: Secondary | ICD-10-CM | POA: Diagnosis present

## 2019-11-13 DIAGNOSIS — I129 Hypertensive chronic kidney disease with stage 1 through stage 4 chronic kidney disease, or unspecified chronic kidney disease: Secondary | ICD-10-CM | POA: Diagnosis present

## 2019-11-13 DIAGNOSIS — M069 Rheumatoid arthritis, unspecified: Secondary | ICD-10-CM

## 2019-11-13 DIAGNOSIS — I671 Cerebral aneurysm, nonruptured: Secondary | ICD-10-CM | POA: Diagnosis present

## 2019-11-13 DIAGNOSIS — R1032 Left lower quadrant pain: Secondary | ICD-10-CM

## 2019-11-13 DIAGNOSIS — N179 Acute kidney failure, unspecified: Secondary | ICD-10-CM

## 2019-11-13 DIAGNOSIS — N184 Chronic kidney disease, stage 4 (severe): Secondary | ICD-10-CM | POA: Diagnosis present

## 2019-11-13 DIAGNOSIS — K5520 Angiodysplasia of colon without hemorrhage: Secondary | ICD-10-CM | POA: Diagnosis present

## 2019-11-13 DIAGNOSIS — Z79899 Other long term (current) drug therapy: Secondary | ICD-10-CM | POA: Diagnosis not present

## 2019-11-13 DIAGNOSIS — N189 Chronic kidney disease, unspecified: Secondary | ICD-10-CM | POA: Diagnosis not present

## 2019-11-13 DIAGNOSIS — Z880 Allergy status to penicillin: Secondary | ICD-10-CM | POA: Diagnosis not present

## 2019-11-13 DIAGNOSIS — Z885 Allergy status to narcotic agent status: Secondary | ICD-10-CM | POA: Diagnosis not present

## 2019-11-13 DIAGNOSIS — Z888 Allergy status to other drugs, medicaments and biological substances status: Secondary | ICD-10-CM | POA: Diagnosis not present

## 2019-11-13 DIAGNOSIS — K521 Toxic gastroenteritis and colitis: Secondary | ICD-10-CM | POA: Diagnosis not present

## 2019-11-13 DIAGNOSIS — Z882 Allergy status to sulfonamides status: Secondary | ICD-10-CM | POA: Diagnosis not present

## 2019-11-13 DIAGNOSIS — Z881 Allergy status to other antibiotic agents status: Secondary | ICD-10-CM | POA: Diagnosis not present

## 2019-11-13 DIAGNOSIS — R7401 Elevation of levels of liver transaminase levels: Secondary | ICD-10-CM | POA: Diagnosis present

## 2019-11-13 DIAGNOSIS — R197 Diarrhea, unspecified: Secondary | ICD-10-CM | POA: Diagnosis present

## 2019-11-13 DIAGNOSIS — Z7982 Long term (current) use of aspirin: Secondary | ICD-10-CM | POA: Diagnosis not present

## 2019-11-13 DIAGNOSIS — Z20822 Contact with and (suspected) exposure to covid-19: Secondary | ICD-10-CM | POA: Diagnosis present

## 2019-11-13 LAB — GLUCOSE, CAPILLARY
Glucose-Capillary: 92 mg/dL (ref 70–99)
Glucose-Capillary: 102 mg/dL — ABNORMAL HIGH (ref 70–99)
Glucose-Capillary: 114 mg/dL — ABNORMAL HIGH (ref 70–99)

## 2019-11-13 LAB — C DIFFICILE QUICK SCREEN W PCR REFLEX
C Diff antigen: NEGATIVE
C Diff interpretation: NOT DETECTED
C Diff toxin: NEGATIVE

## 2019-11-13 LAB — HEMOGLOBIN A1C
Hgb A1c MFr Bld: 5 % (ref 4.8–5.6)
Mean Plasma Glucose: 96.8 mg/dL

## 2019-11-13 LAB — CBC
HCT: 30.5 % — ABNORMAL LOW (ref 36.0–46.0)
Hemoglobin: 10.6 g/dL — ABNORMAL LOW (ref 12.0–15.0)
MCH: 30.5 pg (ref 26.0–34.0)
MCHC: 34.8 g/dL (ref 30.0–36.0)
MCV: 87.9 fL (ref 80.0–100.0)
Platelets: 144 10*3/uL — ABNORMAL LOW (ref 150–400)
RBC: 3.47 MIL/uL — ABNORMAL LOW (ref 3.87–5.11)
RDW: 13.2 % (ref 11.5–15.5)
WBC: 4.5 10*3/uL (ref 4.0–10.5)
nRBC: 0 % (ref 0.0–0.2)

## 2019-11-13 LAB — GASTROINTESTINAL PANEL BY PCR, STOOL (REPLACES STOOL CULTURE)

## 2019-11-13 LAB — BASIC METABOLIC PANEL
Anion gap: 12 (ref 5–15)
BUN: 16 mg/dL (ref 8–23)
CO2: 23 mmol/L (ref 22–32)
Calcium: 8 mg/dL — ABNORMAL LOW (ref 8.9–10.3)
Chloride: 101 mmol/L (ref 98–111)
Creatinine, Ser: 1.98 mg/dL — ABNORMAL HIGH (ref 0.44–1.00)
GFR calc Af Amer: 27 mL/min — ABNORMAL LOW (ref >60)
GFR calc non Af Amer: 23 mL/min — ABNORMAL LOW (ref >60)
Glucose, Bld: 99 mg/dL (ref 70–99)
Potassium: 2.7 mmol/L — CL (ref 3.5–5.1)
Sodium: 136 mmol/L (ref 135–145)

## 2019-11-13 LAB — SARS CORONAVIRUS 2 BY RT PCR (HOSPITAL ORDER, PERFORMED IN ~~LOC~~ HOSPITAL LAB): SARS Coronavirus 2: NEGATIVE

## 2019-11-13 MED ORDER — POTASSIUM CHLORIDE CRYS ER 20 MEQ PO TBCR
40.0000 meq | EXTENDED_RELEASE_TABLET | Freq: Once | ORAL | Status: DC
  Filled 2019-11-13: qty 2

## 2019-11-13 MED ORDER — ACETAMINOPHEN 650 MG RE SUPP: 650.0000 mg | Freq: Four times a day (QID) | RECTAL | Status: DC | PRN

## 2019-11-13 MED ORDER — ONDANSETRON HCL 4 MG/2ML IJ SOLN: 4.0000 mg | Freq: Four times a day (QID) | INTRAMUSCULAR | Status: DC | PRN

## 2019-11-13 MED ORDER — ONDANSETRON HCL 4 MG PO TABS: 4.0000 mg | ORAL_TABLET | Freq: Four times a day (QID) | ORAL | Status: DC | PRN

## 2019-11-13 MED ORDER — POLYETHYLENE GLYCOL 3350 17 GM/SCOOP PO POWD
1.0000 | Freq: Once | ORAL | Status: AC
  Administered 2019-11-13: 255 g via ORAL
  Filled 2019-11-13: qty 255

## 2019-11-13 MED ORDER — ENOXAPARIN SODIUM 30 MG/0.3ML ~~LOC~~ SOLN
30.0000 mg | SUBCUTANEOUS | Status: DC
Start: 2019-11-13 — End: 2019-11-13

## 2019-11-13 MED ORDER — SODIUM CHLORIDE 0.9 % IV SOLN: Freq: Once | INTRAVENOUS | Status: AC

## 2019-11-13 MED ORDER — INSULIN ASPART 100 UNIT/ML ~~LOC~~ SOLN: 0.0000 [IU] | Freq: Three times a day (TID) | SUBCUTANEOUS | Status: DC

## 2019-11-13 MED ORDER — ACETAMINOPHEN 325 MG PO TABS: 650.0000 mg | ORAL_TABLET | Freq: Four times a day (QID) | ORAL | Status: DC | PRN

## 2019-11-13 MED ORDER — HEPARIN SODIUM (PORCINE) 5000 UNIT/ML IJ SOLN
5000.0000 [IU] | Freq: Three times a day (TID) | INTRAMUSCULAR | Status: DC
  Administered 2019-11-13 – 2019-11-16 (×9): 5000 [IU] via SUBCUTANEOUS
  Filled 2019-11-13 (×9): qty 1

## 2019-11-13 MED ORDER — POTASSIUM CHLORIDE 10 MEQ/100ML IV SOLN
10.0000 meq | INTRAVENOUS | Status: AC
  Administered 2019-11-13: 10 meq via INTRAVENOUS
  Filled 2019-11-13: qty 100

## 2019-11-13 MED ORDER — POTASSIUM CHLORIDE 2 MEQ/ML IV SOLN
INTRAVENOUS | Status: AC
  Filled 2019-11-13 (×3): qty 1000

## 2019-11-13 MED ORDER — POTASSIUM CHLORIDE CRYS ER 20 MEQ PO TBCR
40.0000 meq | EXTENDED_RELEASE_TABLET | Freq: Three times a day (TID) | ORAL | Status: AC
  Administered 2019-11-13 – 2019-11-14 (×4): 40 meq via ORAL
  Filled 2019-11-13 (×3): qty 2

## 2019-11-13 MED ORDER — INSULIN ASPART 100 UNIT/ML ~~LOC~~ SOLN
0.0000 [IU] | Freq: Every day | SUBCUTANEOUS | Status: DC
  Administered 2019-11-13: 0 [IU] via SUBCUTANEOUS
  Filled 2019-11-13: qty 1

## 2019-11-13 NOTE — ED Notes (Signed)
Potassium infusion turned down to 75meq per hour due to discomfort.

## 2019-11-13 NOTE — Progress Notes (Signed)
HPI: Carol Kent is a 79 y.o. female with medical history significant for recently diagnosed rheumatoid arthritis, HTN who presents with weakness and abnormal kidney function from doctor's office where she presented on 11/07/2019 with a 4-week history of ongoing diarrhea.  Patient has several bowel movements preceded by colicky abdominal pain ongoing for several weeks though her stool has formed up in the past few days.  She denies fever or chills.  Had associated left lower quadrant crampy pain but this has also improved.  Denies cough or shortness of breath.    ED Course: On arrival vitals were within normal limits.  Blood work significant for creatinine of 2.35, up from baseline of 0.93, with potassium of 2.5.  Elevated bilirubin of 1.5, normal WBC of 8.8 with hemoglobin of 11.7.  CT abdomen and pelvis indicated for left lower quadrant crampy pain pending at time of decision to admit.  Covid PCR pending.  Hospitalist consulted for admission.  11/13/19: Seen and examined.  Persistent diarrhea for the last 4 weeks.  C-diff PCR negative.  CT scan abdomen unrevealing.  GI consulted to possibly rule out microscopic colitis.  Please refer to H&P dictated by partner Dr. Damita Dunnings on 11/13/2019 for further details of the assessment and plan.

## 2019-11-13 NOTE — ED Notes (Signed)
hospitalist in to see pt.

## 2019-11-13 NOTE — ED Notes (Signed)
Report to noel, rn.  

## 2019-11-13 NOTE — ED Notes (Signed)
Report to ariel, rn.  

## 2019-11-13 NOTE — ED Notes (Signed)
Lab notified of need for venipuncture assist with 0500 labs.

## 2019-11-13 NOTE — ED Notes (Signed)
Pt getting up intermittently to use bedside commode with go lytely prep with family assist. Family declines to leave at this time.

## 2019-11-13 NOTE — ED Notes (Signed)
Pt assisted back up to commode for bowel movement.

## 2019-11-13 NOTE — Progress Notes (Signed)
CRITICAL VALUE ALERT  Critical Value: k+ 2.7  Date & Time Notied:  11/13/2019  Provider Notified: ??? Not sure who that is    Orders Received/Actions taken:

## 2019-11-13 NOTE — ED Notes (Signed)
Pt sleeping. 

## 2019-11-13 NOTE — H&P (Signed)
History and Physical    Carol Kent:[id number] DOB: 1941/01/18 DOA: 11/13/2019  PCP: [place] Pink, MD   Patient coming from: Home  I have personally briefly reviewed patient's old medical records in [facility]  Chief Complaint: Diarrhea, weakness, sent from doctor's office for abnormal labs  HPI: Carol Kent is a 79 y.o. female with medical history significant for recently diagnosed rheumatoid arthritis, HTN who presents with weakness and abnormal kidney function from doctor's office where she presented on 11/07/2019 with a 4-week history of ongoing diarrhea.  Patient has several bowel movements preceded by colicky abdominal pain ongoing for several weeks though her stool has formed up in the past few days.  She denies fever or chills.  Had associated left lower quadrant crampy pain but this has also improved.  Denies cough or shortness of breath.  ED Course: On arrival vitals were within normal limits.  Blood work significant for creatinine of 2.35, up from baseline of 0.93, with potassium of 2.5.  Elevated bilirubin of 1.5, normal WBC of 8.8 with hemoglobin of 11.7.  CT abdomen and pelvis indicated for left lower quadrant crampy pain pending at time of decision to admit.  Covid PCR pending.  Hospitalist consulted for admission.  Review of Systems: As per HPI otherwise all other systems on review of systems negative.    Past Medical History:  Diagnosis Date  . Brain aneurysm   . History of cervical fracture     History reviewed. No pertinent surgical history.   reports that she has never smoked. She has never used smokeless tobacco. She reports that she does not drink alcohol. No history on file for drug use.  Allergies  Allergen Reactions  . Codeine Nausea Only    Other reaction(s): Nausea  . Erythromycin Nausea Only  . Fentanyl     Other reaction(s): Hallucination  . Morphine And Related     Other reaction(s): Other (See Comments)  . Propoxyphene Nausea Only     Other reaction(s): Nausea  . Sulfa Antibiotics     Other reaction(s): Nausea  . Other Rash    Pain med - can not remember name  . Penicillins Rash    Other reaction(s): Hives    History reviewed. No pertinent family history.    Prior to Admission medications   Medication Sig Start Date End Date Taking? Authorizing Provider  amLODipine (NORVASC) 5 MG tablet Take 1 tablet (5 mg total) by mouth daily. 03/16/16 03/16/17  Merlyn Lot, MD    Physical Exam: Vitals:   11/12/19 1545 11/12/19 1546 11/12/19 1800 11/12/19 2216  BP: (!) 128/51  109/63 (!) 118/52  Pulse: 96  92 91  Resp: 18  20 18   Temp: 98.5 F (36.9 C)     TempSrc: Oral     SpO2: 97%  96% 97%  Weight:  54.4 kg    Height:  5\' 2"  (1.575 m)       Vitals:   11/12/19 1545 11/12/19 1546 11/12/19 1800 11/12/19 2216  BP: (!) 128/51  109/63 (!) 118/52  Pulse: 96  92 91  Resp: 18  20 18   Temp: 98.5 F (36.9 C)     TempSrc: Oral     SpO2: 97%  96% 97%  Weight:  54.4 kg    Height:  5\' 2"  (1.575 m)        Constitutional: Alert and oriented x 3 . Not in any apparent distress HEENT:      Head: Normocephalic and  atraumatic.         Eyes: PERLA, EOMI, Conjunctivae are normal. Sclera is non-icteric.       Mouth/Throat: Mucous membranes are moist.       Neck: Supple with no signs of meningismus. Cardiovascular: Regular rate and rhythm. No murmurs, gallops, or rubs. 2+ symmetrical distal pulses are present . No JVD. No LE edema Respiratory: Respiratory effort normal .Lungs sounds clear bilaterally. No wheezes, crackles, or rhonchi.  Gastrointestinal: Soft, non tender, and non distended with positive bowel sounds. No rebound or guarding. Genitourinary: No CVA tenderness. Musculoskeletal: Nontender with normal range of motion in all extremities. No cyanosis, or erythema of extremities. Neurologic: Normal speech and language. Face is symmetric. Moving all extremities. No gross focal neurologic deficits . Skin: Skin  is warm, dry.  No rash or ulcers Psychiatric: Mood and affect are normal Speech and behavior are normal   Labs on Admission: I have personally reviewed following labs and imaging studies  CBC: Recent Labs  Lab 11/12/19 1557  WBC 8.8  HGB 11.7*  HCT 32.6*  MCV 86.9  PLT 283   Basic Metabolic Panel: Recent Labs  Lab 11/12/19 1557  NA 133*  K 2.8*  CL 97*  CO2 24  GLUCOSE 165*  BUN 17  CREATININE 2.35*  CALCIUM 8.1*  MG 1.8   GFR: Estimated Creatinine Clearance: 14.8 mL/min (A) (by C-G formula based on SCr of 2.35 mg/dL (H)). Liver Function Tests: Recent Labs  Lab 11/12/19 1557  AST 30  ALT 19  ALKPHOS 128*  BILITOT 1.5*  PROT 6.7  ALBUMIN 3.7   Recent Labs  Lab 11/12/19 1557  LIPASE 34   No results for input(s): AMMONIA in the last 168 hours. Coagulation Profile: No results for input(s): INR, PROTIME in the last 168 hours. Cardiac Enzymes: No results for input(s): CKTOTAL, CKMB, CKMBINDEX, TROPONINI in the last 168 hours. BNP (last 3 results) No results for input(s): PROBNP in the last 8760 hours. HbA1C: No results for input(s): HGBA1C in the last 72 hours. CBG: No results for input(s): GLUCAP in the last 168 hours. Lipid Profile: No results for input(s): CHOL, HDL, LDLCALC, TRIG, CHOLHDL, LDLDIRECT in the last 72 hours. Thyroid Function Tests: No results for input(s): TSH, T4TOTAL, FREET4, T3FREE, THYROIDAB in the last 72 hours. Anemia Panel: No results for input(s): VITAMINB12, FOLATE, FERRITIN, TIBC, IRON, RETICCTPCT in the last 72 hours. Urine analysis:    Component Value Date/Time   COLORURINE STRAW (A) 03/16/2016 1654   APPEARANCEUR CLEAR (A) 03/16/2016 1654   LABSPEC 1.006 03/16/2016 1654   PHURINE 6.0 03/16/2016 1654   GLUCOSEU NEGATIVE 03/16/2016 1654   HGBUR NEGATIVE 03/16/2016 1654   BILIRUBINUR NEGATIVE 03/16/2016 1654   KETONESUR NEGATIVE 03/16/2016 1654   PROTEINUR NEGATIVE 03/16/2016 1654   NITRITE NEGATIVE 03/16/2016 1654    LEUKOCYTESUR NEGATIVE 03/16/2016 1654    Radiological Exams on Admission: No results found.  EKG: Independently reviewed. Interpretation : Sinus tachycardia with nonspecific ST-T wave changes  Assessment/Plan 79 year old female with history of rheumatoid arthritis diagnosed in April on leflunomide as well as history of hypertension presenting with a several week history of diarrhea associated with left lower quadrant pain with abnormal kidney function in outpatient setting.  Sent in by PCP for admission  Diarrhea, subacute, with LLQ pain AKI (acute kidney injury) ([place])   Hypokalemia -Diarrhea for over 4 weeks, infectious versus noninfectious with hypovolemia resulting in AKI, creatinine 2.34 above baseline of 0.93 and hypokalemia due to GI losses -GI panel  and C. difficile with enteric precautions -IV hydration -Monitor renal function -Follow-up CT abdomen -Supportive care    Rheumatoid arthritis ([place]) -Hold immunosuppressants for now.    DVT prophylaxis: Lovenox  Code Status: full code  Family Communication:  none  Disposition Plan: Back to previous home environment Consults called: none  Status: Observation    Athena Masse MD [facility]     11/13/2019, 1:22 AM

## 2019-11-13 NOTE — ED Provider Notes (Signed)
[facility] Emergency Department Provider Note  ____________________________________________   First MD Initiated Contact with Patient 11/13/19 0019     (approximate)  I have reviewed the triage vital signs and the nursing notes.   HISTORY  Chief Complaint Diarrhea and Weakness    HPI Carol Kent is a 79 y.o. female with medical history as listed below who presents  with her adult daughter for evaluation of dehydration and abnormal labs.  She has had diarrhea for the last 4 weeks or so, although she reports it has gotten significantly better over the last week and she is now having some formed stools that are far less frequent.  She is also recently had left lower quadrant abdominal pain that was worse last week but has improved a lot this week although it still occasionally hurts her and is associated with bowel urgency.  She has had no fever or chills, sore throat, chest pain, shortness of breath, cough, nausea, vomiting, nor dysuria.  Nothing in particular made the symptoms better or worse but she admits that she has not been eating or drinking well at all for the last few weeks.  She went to her primary care doctor last week and they did some lab work and were concerned about her renal function.  They apparently sent her message through my chart and encouraged her to come to the clinic to get some fluids but there was a miscommunication and she did not receive the message.  They contacted her again today and encouraged her to come to the emergency department.  Overall her symptoms were severe up until last week but they have gradually improved and are now only moderate in severity, sometimes mild.        Past Medical History:  Diagnosis Date  . Brain aneurysm   . History of cervical fracture      History reviewed. No pertinent surgical history.  Prior to Admission medications   Medication Sig Start Date End Date Taking? Authorizing Provider    amLODipine (NORVASC) 5 MG tablet Take 1 tablet (5 mg total) by mouth daily. 03/16/16 03/16/17  Merlyn Lot, MD    Allergies Codeine, Erythromycin, Fentanyl, Morphine and related, Propoxyphene, Sulfa antibiotics, Other, and Penicillins  No family history on file.  Social History Social History   Tobacco Use  . Smoking status: Never Smoker  . Smokeless tobacco: Never Used  Substance Use Topics  . Alcohol use: No  . Drug use: Not on file    Review of Systems Constitutional: No fever/chills.  Generalized weakness. Eyes: No visual changes. ENT: No sore throat. Cardiovascular: Denies chest pain. Respiratory: Denies shortness of breath. Gastrointestinal: Diarrhea for 4 weeks, now improving.  No nausea nor vomiting.  Decreased oral intake.  Left lower quadrant abdominal pain last week, now improved. Genitourinary: Negative for dysuria. Musculoskeletal: Negative for neck pain.  Negative for back pain. Integumentary: Negative for rash. Neurological: Negative for headaches, focal weakness or numbness.   ____________________________________________   PHYSICAL EXAM:  VITAL SIGNS: ED Triage Vitals  Enc Vitals Group     BP 11/12/19 1545 (!) 128/51     Pulse Rate 11/12/19 1545 96     Resp 11/12/19 1545 18     Temp 11/12/19 1545 98.5 F (36.9 C)     Temp Source 11/12/19 1545 Oral     SpO2 11/12/19 1545 97 %     Weight 11/12/19 1546 54.4 kg (120 lb)     Height 11/12/19 1546 1.575  m (5\' 2" )     Head Circumference --      Peak Flow --      Pain Score 11/12/19 1545 0     Pain Loc --      Pain Edu? --      Excl. in [place]? --     Constitutional: Alert and oriented.  Eyes: Conjunctivae are normal.  Head: Atraumatic. Nose: No congestion/rhinnorhea. Mouth/Throat: Patient is wearing a mask. Neck: No stridor.  No meningeal signs.   Cardiovascular: Normal rate, regular rhythm. Good peripheral circulation. Grossly normal heart sounds. Respiratory: Normal respiratory effort.  No  retractions. Gastrointestinal: Soft and nontender. No distention.  Musculoskeletal: No lower extremity tenderness nor edema. No gross deformities of extremities. Neurologic:  Normal speech and language. No gross focal neurologic deficits are appreciated.  Skin:  Skin is warm, dry and intact. Psychiatric: Mood and affect are normal. Speech and behavior are normal.  ____________________________________________   LABS (all labs ordered are listed, but only abnormal results are displayed)  Labs Reviewed  COMPREHENSIVE METABOLIC PANEL - Abnormal; Notable for the following components:      Result Value   Sodium 133 (*)    Potassium 2.8 (*)    Chloride 97 (*)    Glucose, Bld 165 (*)    Creatinine, Ser 2.35 (*)    Calcium 8.1 (*)    Alkaline Phosphatase 128 (*)    Total Bilirubin 1.5 (*)    GFR calc non Af Amer 19 (*)    GFR calc Af Amer 22 (*)    All other components within normal limits  CBC - Abnormal; Notable for the following components:   RBC 3.75 (*)    Hemoglobin 11.7 (*)    HCT 32.6 (*)    All other components within normal limits  C DIFFICILE QUICK SCREEN W PCR REFLEX  GASTROINTESTINAL PANEL BY PCR, STOOL (REPLACES STOOL CULTURE)  SARS CORONAVIRUS 2 BY RT PCR (HOSPITAL ORDER, [place] [facility])  LIPASE, BLOOD  MAGNESIUM  URINALYSIS, COMPLETE (UACMP) WITH MICROSCOPIC  CBC  CREATININE, SERUM  BASIC METABOLIC PANEL  CBC   ____________________________________________  EKG  ED ECG REPORT I, Hinda Kehr, the attending physician, personally viewed and interpreted this ECG.  Date: 11/12/2019 EKG Time: 16: 06 Rate: 93 Rhythm: normal sinus rhythm QRS Axis: normal Intervals: normal ST/T Wave abnormalities: Non-specific ST segment / T-wave changes, but no clear evidence of acute ischemia. Narrative Interpretation: no definitive evidence of acute ischemia; does not meet STEMI  criteria.   ____________________________________________  RADIOLOGY I, Hinda Kehr, personally viewed and evaluated these images (plain radiographs) as part of my medical decision making, as well as reviewing the written report by the radiologist.  ED MD interpretation: CT scan of the abdomen and pelvis with oral contrast only demonstrates a diarrheal illness, but no evidence of acute intra-abdominal infection requiring antibiotics.  Official radiology report(s): CT ABDOMEN PELVIS WO CONTRAST  Result Date: 11/13/2019 CLINICAL DATA:  Diarrhea abdominal pain EXAM: CT ABDOMEN AND PELVIS WITHOUT CONTRAST TECHNIQUE: Multidetector CT imaging of the abdomen and pelvis was performed following the standard protocol without IV contrast. COMPARISON:  None. FINDINGS: Lower chest: Lung bases demonstrate mild subpleural fibrosis. No acute consolidation or effusion. Coronary calcifications. Small hiatal hernia. Hepatobiliary: No focal liver abnormality is seen. Status post cholecystectomy. No biliary dilatation. Pancreas: Unremarkable. No pancreatic ductal dilatation or surrounding inflammatory changes. Spleen: Normal in size without focal abnormality. Adrenals/Urinary Tract: Adrenal glands are normal. Suspected parapelvic cysts. Kidneys no  definitive hydronephrosis. No ureteral stone. The bladder is normal. Cortical thinning within the kidneys consistent with atrophy. Stomach/Bowel: The stomach is nonenlarged. No dilated small bowel. Negative appendix. Fluid within the colon consistent with diarrheal illness. Diverticular disease of the sigmoid colon. No acute wall thickening. Vascular/Lymphatic: Moderate aortic atherosclerosis without aneurysm. No suspicious adenopathy. Reproductive: Uterus and bilateral adnexa are unremarkable. Other: Negative for free air or free fluid. Small fat containing umbilical hernia. Musculoskeletal: No acute or significant osseous findings. IMPRESSION: 1. Fluid within the colon  consistent with diarrheal illness. No acute wall thickening. 2. Sigmoid colon diverticular disease without acute inflammatory change. 3. Cortical thinning within the kidneys consistent with atrophy. Suspected parapelvic cysts Aortic Atherosclerosis (ICD10-I70.0). Electronically Signed   By: Donavan Foil M.D.   On: 11/13/2019 02:59    ____________________________________________   PROCEDURES   Procedure(s) performed (including Critical Care):  Procedures   ____________________________________________   INITIAL IMPRESSION / MDM / ASSESSMENT AND PLAN / ED COURSE  As part of my medical decision making, I reviewed the following data within the [place] History obtained from family, Nursing notes reviewed and incorporated, Labs reviewed , EKG interpreted , Old EKG reviewed, Discussed with admitting physician  and Notes from prior ED visits   Differential diagnosis includes, but is not limited to, acute renal failure/acute kidney injury, dehydration, electrolyte or metabolic abnormality, infectious diarrhea, chronic colitis, diverticulitis or other acute intra-abdominal infection.  Patient's labs are notable for substantially elevated creatinine particularly given her age and lack of muscle mass.  This is indicative of acute renal failure given her baseline creatinine of around 0.8 and her current creatinine of 2.35.  No leukocytosis.  No tenderness to palpation of her abdomen but given her report of diarrhea as well as the left lower quadrant abdominal pain, I am obtaining a CT scan of the abdomen and pelvis with oral contrast only to make sure there is no evidence of acute diverticulitis or other acute or subacute infection that would require antibiotics.  I have ordered stool studies including C. difficile and GI pathogen panel but given that her diarrhea is improving she may not be able to produce a sample appropriate for study.  She has received 1 L of lactated Ringer's and I  ordered a rate of 125 mL/h normal saline.  She is also received 40 mEq of potassium by mouth and 10 mEq by IV.  Magnesium is normal, hemoglobin is normal.  The patient is unvaccinated for COVID-19 and I have explained she will be swab but I have a low suspicion for COVID-19 infection at this time and she has no respiratory symptoms regardless.  Her daughter is at bedside and I discussed all this with the patient and her daughter and they understand the need for admission.       Clinical Course as of Nov 13 303  Sun Nov 13, 2019  0050 [facility] hospitalist team for admission   [CF]  0050 Discussed case by phone with Dr. Damita Dunnings with the hospitalist service.  She will admit.   [CF]  0304 Evidence of a diarrheal illness but no evidence of acute or emergent intra-abdominal infection requiring antibiotics.  CT ABDOMEN PELVIS WO CONTRAST [CF]    Clinical Course User Index [CF] Hinda Kehr, MD     ____________________________________________  FINAL CLINICAL IMPRESSION(S) / ED DIAGNOSES  Final diagnoses:  Acute renal failure, unspecified acute renal failure type ([place])  Diarrhea of presumed infectious origin  Hypokalemia  Generalized weakness  Dehydration  MEDICATIONS GIVEN DURING THIS VISIT:  Medications  potassium chloride 10 mEq in 100 mL IVPB ( Intravenous Rate/Dose Change 11/13/19 0045)  0.9 %  sodium chloride infusion (has no administration in time range)  enoxaparin (LOVENOX) injection 30 mg (has no administration in time range)  acetaminophen (TYLENOL) tablet 650 mg (has no administration in time range)    Or  acetaminophen (TYLENOL) suppository 650 mg (has no administration in time range)  ondansetron (ZOFRAN) tablet 4 mg (has no administration in time range)    Or  ondansetron (ZOFRAN) injection 4 mg (has no administration in time range)  potassium chloride 10 mEq in 100 mL IVPB (has no administration in time range)  potassium chloride SA (KLOR-CON) CR tablet 40 mEq  (has no administration in time range)  lactated ringers bolus 1,000 mL (1,000 mLs Intravenous New Bag/Given 11/13/19 0030)  potassium chloride SA (KLOR-CON) CR tablet 40 mEq (40 mEq Oral Given 11/13/19 0030)     ED Discharge Orders    None      *Please note:  KYLEE UMANA was evaluated in Emergency Department on 11/13/2019 for the symptoms described in the history of present illness. She was evaluated in the context of the global COVID-19 pandemic, which necessitated consideration that the patient might be at risk for infection with the SARS-CoV-2 virus that causes COVID-19. Institutional protocols and algorithms that pertain to the evaluation of patients at risk for COVID-19 are in a state of rapid change based on information released by regulatory bodies including the CDC and federal and state organizations. These policies and algorithms were followed during the patient's care in the ED.  Some ED evaluations and interventions may be delayed as a result of limited staffing during and after the pandemic.*  Note:  This document was prepared using Dragon voice recognition software and may include unintentional dictation errors.   Hinda Kehr, MD 11/13/19 [phone]

## 2019-11-13 NOTE — Progress Notes (Signed)
PT Cancellation Note  Patient Details Name: Carol Kent MRN: [id number] DOB: [birth year]   Cancelled Treatment:    Reason Eval/Treat Not Completed: Medical issues which prohibited therapy. Potassium is now 2.7 which is below the recommended range for activity. Will hold PT evaluation at this time and follow up as appropriate.   Minna Merritts, PT, MPT  Percell Locus 11/13/2019, 12:46 PM

## 2019-11-13 NOTE — ED Notes (Signed)
Pt up to commode to have bowel movement.  

## 2019-11-14 ENCOUNTER — Inpatient Hospital Stay: Payer: Medicare Other | Admitting: Anesthesiology

## 2019-11-14 ENCOUNTER — Encounter: Payer: Self-pay | Admitting: Internal Medicine

## 2019-11-14 ENCOUNTER — Encounter
Admission: EM | Disposition: A | Discharge status: Home Health | Payer: Self-pay | Source: Ambulatory Visit | Attending: Internal Medicine

## 2019-11-14 DIAGNOSIS — R197 Diarrhea, unspecified: Secondary | ICD-10-CM

## 2019-11-14 HISTORY — PX: COLONOSCOPY WITH PROPOFOL: SHX5780

## 2019-11-14 LAB — BASIC METABOLIC PANEL
Anion gap: 10 (ref 5–15)
BUN: 14 mg/dL (ref 8–23)
CO2: 21 mmol/L — ABNORMAL LOW (ref 22–32)
Calcium: 8.4 mg/dL — ABNORMAL LOW (ref 8.9–10.3)
Chloride: 108 mmol/L (ref 98–111)
Creatinine, Ser: 1.83 mg/dL — ABNORMAL HIGH (ref 0.44–1.00)
GFR calc Af Amer: 29 mL/min — ABNORMAL LOW (ref >60)
GFR calc non Af Amer: 25 mL/min — ABNORMAL LOW (ref >60)
Glucose, Bld: 97 mg/dL (ref 70–99)
Potassium: 4 mmol/L (ref 3.5–5.1)
Sodium: 139 mmol/L (ref 135–145)

## 2019-11-14 LAB — CBC
HCT: 31.7 % — ABNORMAL LOW (ref 36.0–46.0)
Hemoglobin: 10.7 g/dL — ABNORMAL LOW (ref 12.0–15.0)
MCH: 30.8 pg (ref 26.0–34.0)
MCHC: 33.8 g/dL (ref 30.0–36.0)
MCV: 91.4 fL (ref 80.0–100.0)
Platelets: 154 10*3/uL (ref 150–400)
RBC: 3.47 MIL/uL — ABNORMAL LOW (ref 3.87–5.11)
RDW: 13.2 % (ref 11.5–15.5)
WBC: 4.2 10*3/uL (ref 4.0–10.5)
nRBC: 0 % (ref 0.0–0.2)

## 2019-11-14 LAB — GLUCOSE, CAPILLARY
Glucose-Capillary: 100 mg/dL — ABNORMAL HIGH (ref 70–99)
Glucose-Capillary: 104 mg/dL — ABNORMAL HIGH (ref 70–99)
Glucose-Capillary: 92 mg/dL (ref 70–99)

## 2019-11-14 LAB — MAGNESIUM: Magnesium: 1.8 mg/dL (ref 1.7–2.4)

## 2019-11-14 SURGERY — COLONOSCOPY WITH PROPOFOL
Anesthesia: General

## 2019-11-14 MED ORDER — SODIUM CHLORIDE 0.9 % IV SOLN
INTRAVENOUS | Status: DC | PRN
Start: 2019-11-14 — End: 2019-11-14

## 2019-11-14 MED ORDER — PROPOFOL 10 MG/ML IV BOLUS
INTRAVENOUS | Status: DC | PRN
  Administered 2019-11-14: 50 mg via INTRAVENOUS

## 2019-11-14 MED ORDER — PROPOFOL 500 MG/50ML IV EMUL
INTRAVENOUS | Status: DC | PRN
  Administered 2019-11-14: 100 ug/kg/min via INTRAVENOUS

## 2019-11-14 NOTE — Transfer of Care (Signed)
Immediate Anesthesia Transfer of Care Note  Patient: Carol Kent  Procedure(s) Performed: COLONOSCOPY WITH PROPOFOL (N/A )  Patient Location: PACU and Endoscopy Unit  Anesthesia Type:General  Level of Consciousness: sedated  Airway & Oxygen Therapy: Patient Spontanous Breathing  Post-op Assessment: Report given to RN  Post vital signs: stable  Last Vitals:  Vitals Value Taken Time  BP    Temp    Pulse    Resp    SpO2      Last Pain:  Vitals:   11/14/19 1549  TempSrc:   PainSc: 0-No pain         Complications: No complications documented.

## 2019-11-14 NOTE — Anesthesia Preprocedure Evaluation (Deleted)
Anesthesia Evaluation  Patient identified by MRN, date of birth, ID band Patient awake    Reviewed: Allergy & Precautions, NPO status , Patient's Chart, lab work & pertinent test results  Airway Mallampati: III       Dental   Pulmonary neg pulmonary ROS,    Pulmonary exam normal        Cardiovascular hypertension, Normal cardiovascular exam     Neuro/Psych  Neuromuscular disease negative psych ROS   GI/Hepatic Neg liver ROS,   Endo/Other  negative endocrine ROS  Renal/GU Renal disease     Musculoskeletal  (+) Arthritis ,   Abdominal Normal abdominal exam  (+)   Peds negative pediatric ROS (+)  Hematology  (+) anemia ,   Anesthesia Other Findings Past Medical History: No date: Brain aneurysm No date: History of cervical fracture  Reproductive/Obstetrics                             Anesthesia Physical Anesthesia Plan  ASA: III  Anesthesia Plan: General   Post-op Pain Management:    Induction: Intravenous  PONV Risk Score and Plan: Propofol infusion  Airway Management Planned: Nasal Cannula  Additional Equipment:   Intra-op Plan:   Post-operative Plan:   Informed Consent: I have reviewed the patients History and Physical, chart, labs and discussed the procedure including the risks, benefits and alternatives for the proposed anesthesia with the patient or authorized representative who has indicated his/her understanding and acceptance.     Dental advisory given  Plan Discussed with: CRNA and Surgeon  Anesthesia Plan Comments:         Anesthesia Quick Evaluation

## 2019-11-14 NOTE — Op Note (Signed)
[facility] Patient Name: Carol Kent Procedure Date: 11/14/2019 4:00 PM MRN: [id number] Account #: [id number] Date of Birth: 1941-02-19 Admit Type: Inpatient Age: 79 Room: [facility] ENDO ROOM 4 Gender: Female Note Status: Finalized Procedure:             Colonoscopy Indications:           Diarrhea Providers:             Jonathon Bellows MD, MD Referring MD:          No Local Md, MD (Referring MD) Medicines:             Monitored Anesthesia Care Complications:         No immediate complications. Procedure:             Pre-Anesthesia Assessment:                        - Prior to the procedure, a History and Physical was                         performed, and patient medications, allergies and                         sensitivities were reviewed. The patient's tolerance                         of previous anesthesia was reviewed.                        - The risks and benefits of the procedure and the                         sedation options and risks were discussed with the                         patient. All questions were answered and informed                         consent was obtained.                        - ASA Grade Assessment: III - A patient with severe                         systemic disease.                        After obtaining informed consent, the colonoscope was                         passed under direct vision. Throughout the procedure,                         the patient's blood pressure, pulse, and oxygen                         saturations were monitored continuously. The                         Colonoscope was introduced through the anus and  advanced to the the cecum, identified by the                         appendiceal orifice. The colonoscopy was performed                         with ease. The patient tolerated the procedure well.                         The quality of the bowel preparation was  excellent. Findings:      The perianal and digital rectal examinations were normal.      Three medium-sized localized angioectasias without bleeding were found       in the cecum.      The exam was otherwise without abnormality.      The entire examined colon appeared normal on direct and retroflexion       views.      Normal mucosa was found in the entire colon. Biopsies were taken with a       cold forceps for histology. Impression:            - Three non-bleeding colonic angioectasias.                        - The examination was otherwise normal.                        - The entire examined colon is normal on direct and                         retroflexion views. Recommendation:        - Return patient to hospital ward for ongoing care.                        - Advance diet as tolerated today.                        - 1. Consider holding Leflunomide as it is associated                         with diarrhea .                        2. F/u pathology results Procedure Code(s):     --- Professional ---                        [phone], Colonoscopy, flexible; with biopsy, single or                         multiple Diagnosis Code(s):     --- Professional ---                        K55.20, Angiodysplasia of colon without hemorrhage                        R19.7, Diarrhea, unspecified CPT copyright 2019 American Medical Association. All rights reserved. The codes documented in this report are preliminary and upon coder review may  be revised to meet current compliance requirements. Jonathon Bellows, MD Jonathon Bellows  MD, MD 11/14/2019 4:27:12 PM This report has been signed electronically. Number of Addenda: 0 Note Initiated On: 11/14/2019 4:00 PM Scope Withdrawal Time: 0 hours 12 minutes 10 seconds  Total Procedure Duration: 0 hours 16 minutes 56 seconds  Estimated Blood Loss:  Estimated blood loss: none.      [facility]

## 2019-11-14 NOTE — H&P (Signed)
Carol Bellows, MD [address], [place], [place], [place], [address] [id number], [place], [place], [place], [address] Phone: [phone]  Fax: [phone]  Primary Care Physician:  [place] Pink, MD   Pre-Procedure History & Physical: HPI:  MICOLE Kent is a 79 y.o. female is here for an colonoscopy.   Past Medical History:  Diagnosis Date  . Brain aneurysm   . History of cervical fracture     History reviewed. No pertinent surgical history.  Prior to Admission medications   Medication Sig Start Date End Date Taking? Authorizing Provider  amLODipine (NORVASC) 5 MG tablet Take 1 tablet (5 mg total) by mouth daily. 03/16/16 11/13/19 Yes Merlyn Lot, MD  aspirin EC 81 MG tablet Take 81 mg by mouth daily. Swallow whole.   Yes [provider]  Lactobacillus (PROBIOTIC ACIDOPHILUS) TABS Take 1 tablet by mouth daily.   Yes [provider]  leflunomide (ARAVA) 20 MG tablet Take 20 mg by mouth daily. 10/20/19  Yes [provider]  triamterene-hydrochlorothiazide (MAXZIDE-25) 37.5-25 MG tablet Take 1 tablet by mouth daily. 09/25/19  Yes [provider]  diclofenac Sodium (VOLTAREN) 1 % GEL Apply 2 g topically 2 (two) times daily. Patient not taking: Reported on 11/13/2019 09/14/19   [provider]    Allergies as of 11/12/2019 - Review Complete 11/12/2019  Allergen Reaction Noted  . Codeine Nausea Only 09/24/2015  . Erythromycin Nausea Only 09/24/2015  . Fentanyl  03/30/2016  . Morphine and related  07/25/2019  . Propoxyphene Nausea Only 11/12/2019  . Sulfa antibiotics  11/12/2019  . Other Rash 09/24/2015  . Penicillins Rash 09/24/2015    History reviewed. No pertinent family history.  Social History   Socioeconomic History  . Marital status: Married    Spouse name: Not on file  . Number of children: Not on file  . Years of education: Not on file  . Highest education level: Not on file  Occupational History  .  Not on file  Tobacco Use  . Smoking status: Never Smoker  . Smokeless tobacco: Never Used  Substance and Sexual Activity  . Alcohol use: No  . Drug use: Not on file  . Sexual activity: Not on file  Other Topics Concern  . Not on file  Social History Narrative  . Not on file   Social Determinants of Health   Financial Resource Strain:   . Difficulty of Paying Living Expenses:   Food Insecurity:   . Worried About Charity fundraiser in the Last Year:   . Arboriculturist in the Last Year:   Transportation Needs:   . Film/video editor (Medical):   [name] Lack of Transportation (Non-Medical):   Physical Activity:   . Days of Exercise per Week:   . Minutes of Exercise per Session:   Stress:   . Feeling of Stress :   Social Connections:   . Frequency of Communication with Friends and Family:   . Frequency of Social Gatherings with Friends and Family:   . Attends Religious Services:   . Active Member of Clubs or Organizations:   . Attends Archivist Meetings:   [name] Marital Status:   Intimate Partner Violence:   . Fear of Current or Ex-Partner:   . Emotionally Abused:   [name] Physically Abused:   . Sexually Abused:     Review of Systems: See HPI, otherwise negative ROS  Physical Exam: BP (!) 136/55   Pulse  81   Temp 98 F (36.7 C) (Oral)   Resp 18   Ht 5\' 2"  (1.575 m)   Wt 54.4 kg   SpO2 98%   BMI 21.95 kg/m  General:   Alert,  pleasant and cooperative in NAD Head:  Normocephalic and atraumatic. Neck:  Supple; no masses or thyromegaly. Lungs:  Clear throughout to auscultation, normal respiratory effort.    Heart:  +S1, +S2, Regular rate and rhythm, No edema. Abdomen:  Soft, nontender and nondistended. Normal bowel sounds, without guarding, and without rebound.   Neurologic:  Alert and  oriented x4;  grossly normal neurologically.  Impression/Plan: Carol Kent is here for an colonoscopy to be performed for  Diarrhea .Risks, benefits, limitations, and  alternatives regarding  colonoscopy have been reviewed with the patient.  Questions have been answered.  All parties agreeable.   Carol Bellows, MD  11/14/2019, 4:00 PM

## 2019-11-14 NOTE — Anesthesia Preprocedure Evaluation (Signed)
Anesthesia Evaluation  Patient identified by MRN, date of birth, ID band Patient awake    Reviewed: Allergy & Precautions, H&P , NPO status , Patient's Chart, lab work & pertinent test results, reviewed documented beta blocker date and time   History of Anesthesia Complications Negative for: history of anesthetic complications  Airway Mallampati: II  TM Distance: >3 FB Neck ROM: full    Dental  (+) Dental Advidsory Given, Upper Dentures, Lower Dentures   Pulmonary neg shortness of breath, neg COPD, neg recent URI, former smoker,    Pulmonary exam normal breath sounds clear to auscultation       Cardiovascular Exercise Tolerance: Good hypertension, (-) angina(-) Past MI and (-) Cardiac Stents Normal cardiovascular exam(-) dysrhythmias (-) Valvular Problems/Murmurs Rhythm:regular Rate:Normal     Neuro/Psych neg Seizures Brain aneurysm s/p repair negative psych ROS   GI/Hepatic negative GI ROS, Neg liver ROS,   Endo/Other  negative endocrine ROS  Renal/GU Renal disease (AKI)  negative genitourinary   Musculoskeletal   Abdominal   Peds  Hematology negative hematology ROS (+)   Anesthesia Other Findings Past Medical History: No date: Brain aneurysm No date: History of cervical fracture   Reproductive/Obstetrics negative OB ROS                             Anesthesia Physical Anesthesia Plan  ASA: III  Anesthesia Plan: General   Post-op Pain Management:    Induction: Intravenous  PONV Risk Score and Plan: 3 and Propofol infusion and TIVA  Airway Management Planned: Natural Airway and Nasal Cannula  Additional Equipment:   Intra-op Plan:   Post-operative Plan:   Informed Consent: I have reviewed the patients History and Physical, chart, labs and discussed the procedure including the risks, benefits and alternatives for the proposed anesthesia with the patient or authorized  representative who has indicated his/her understanding and acceptance.     Dental Advisory Given  Plan Discussed with: Anesthesiologist, CRNA and Surgeon  Anesthesia Plan Comments:         Anesthesia Quick Evaluation

## 2019-11-14 NOTE — ED Notes (Signed)
Pt to toilet att, daughter assisted

## 2019-11-14 NOTE — ED Notes (Signed)
[facility] EMR clean up att

## 2019-11-14 NOTE — ED Notes (Addendum)
Pt bedside commode cleaned  Daughter at bedside, room temp adjusted

## 2019-11-14 NOTE — Consult Note (Signed)
Carol Kent , MD [address] [place], [place], [place], [address] [address], [place], [place], [place], [address] Phone: [phone]  Fax: [phone]  Consultation  Referring Provider:     Dr Damita Dunnings Primary Care Physician:  Maryland Pink, MD Primary Gastroenterologist:  None          Reason for Consultation:     Diarrhea   Date of Admission:  11/13/2019 Date of Consultation:  11/14/2019         HPI:   Carol Kent is a 79 y.o. female admitted yesterday for ongoing diarrhea for > 4 weeks duration . Stool studies for C diff and GI PCR were negative, Found to have AKI Patient is on Leflunomide which she states she started not too long ago , priobably weeks to a few months back. Denies any NSAID use. Been having watery diarrhea multiple times a day . No prior colonoscopy. No blood in her stool. No family history ogf colon cancer, denies any abdominal pain.    Past Medical History:  Diagnosis Date  . Brain aneurysm   . History of cervical fracture     History reviewed. No pertinent surgical history.  Prior to Admission medications   Medication Sig Start Date End Date Taking? Authorizing Provider  amLODipine (NORVASC) 5 MG tablet Take 1 tablet (5 mg total) by mouth daily. 03/16/16 11/13/19 Yes Merlyn Lot, MD  aspirin EC 81 MG tablet Take 81 mg by mouth daily. Swallow whole.   Yes [provider]  Lactobacillus (PROBIOTIC ACIDOPHILUS) TABS Take 1 tablet by mouth daily.   Yes [provider]  leflunomide (ARAVA) 20 MG tablet Take 20 mg by mouth daily. 10/20/19  Yes [provider]  triamterene-hydrochlorothiazide (MAXZIDE-25) 37.5-25 MG tablet Take 1 tablet by mouth daily. 09/25/19  Yes [provider]  diclofenac Sodium (VOLTAREN) 1 % GEL Apply 2 g topically 2 (two) times daily. Patient not taking: Reported on 11/13/2019 09/14/19   [provider]    History reviewed. No pertinent family history.   Social History    Tobacco Use  . Smoking status: Never Smoker  . Smokeless tobacco: Never Used  Substance Use Topics  . Alcohol use: No  . Drug use: Not on file    Allergies as of 11/12/2019 - Review Complete 11/12/2019  Allergen Reaction Noted  . Codeine Nausea Only 09/24/2015  . Erythromycin Nausea Only 09/24/2015  . Fentanyl  03/30/2016  . Morphine and related  07/25/2019  . Propoxyphene Nausea Only 11/12/2019  . Sulfa antibiotics  11/12/2019  . Other Rash 09/24/2015  . Penicillins Rash 09/24/2015    Review of Systems:    All systems reviewed and negative except where noted in HPI.   Physical Exam:  Vital signs in last 24 hours: Temp:  [98 F (36.7 C)-98.1 F (36.7 C)] 98 F (36.7 C) (08/15 2330) Pulse Rate:  [80-85] 81 (08/16 1449) Resp:  [16-22] 18 (08/16 1449) BP: (117-136)/(55-67) 136/55 (08/16 1449) SpO2:  [94 %-98 %] 98 % (08/16 1449)   General:   Pleasant, cooperative in NAD Head:  Normocephalic and atraumatic. Eyes:   No icterus.   Conjunctiva pink. PERRLA. Ears:  Normal auditory acuity. Neck:  Supple; no masses or thyroidomegaly Lungs: Respirations even and unlabored. Lungs clear to auscultation bilaterally.   No wheezes, crackles, or rhonchi.  Heart:  Regular rate and rhythm;  Without murmur, clicks, rubs or gallops Abdomen:  Soft, nondistended, nontender. Normal bowel sounds. No appreciable masses or hepatomegaly.  No rebound or guarding.  Neurologic:  Alert and oriented x3;  grossly normal neurologically. Skin:  Intact without significant lesions or rashes. Cervical Nodes:  No significant cervical adenopathy. Psych:  Alert and cooperative. Normal affect.  LAB RESULTS: Recent Labs    11/12/19 1557 11/13/19 0906 11/14/19 1107  WBC 8.8 4.5 4.2  HGB 11.7* 10.6* 10.7*  HCT 32.6* 30.5* 31.7*  PLT 197 144* 154   BMET Recent Labs    11/12/19 1557 11/13/19 0906 11/14/19 1107  NA 133* 136 139  K 2.8* 2.7* 4.0  CL 97* 101 108  CO2 24 23 21*  GLUCOSE 165* 99  97  BUN 17 16 14   CREATININE 2.35* 1.98* 1.83*  CALCIUM 8.1* 8.0* 8.4*   LFT Recent Labs    11/12/19 1557  PROT 6.7  ALBUMIN 3.7  AST 30  ALT 19  ALKPHOS 128*  BILITOT 1.5*   PT/INR No results for input(s): LABPROT, INR in the last 72 hours.  STUDIES: CT ABDOMEN PELVIS WO CONTRAST  Result Date: 11/13/2019 CLINICAL DATA:  Diarrhea abdominal pain EXAM: CT ABDOMEN AND PELVIS WITHOUT CONTRAST TECHNIQUE: Multidetector CT imaging of the abdomen and pelvis was performed following the standard protocol without IV contrast. COMPARISON:  None. FINDINGS: Lower chest: Lung bases demonstrate mild subpleural fibrosis. No acute consolidation or effusion. Coronary calcifications. Small hiatal hernia. Hepatobiliary: No focal liver abnormality is seen. Status post cholecystectomy. No biliary dilatation. Pancreas: Unremarkable. No pancreatic ductal dilatation or surrounding inflammatory changes. Spleen: Normal in size without focal abnormality. Adrenals/Urinary Tract: Adrenal glands are normal. Suspected parapelvic cysts. Kidneys no definitive hydronephrosis. No ureteral stone. The bladder is normal. Cortical thinning within the kidneys consistent with atrophy. Stomach/Bowel: The stomach is nonenlarged. No dilated small bowel. Negative appendix. Fluid within the colon consistent with diarrheal illness. Diverticular disease of the sigmoid colon. No acute wall thickening. Vascular/Lymphatic: Moderate aortic atherosclerosis without aneurysm. No suspicious adenopathy. Reproductive: Uterus and bilateral adnexa are unremarkable. Other: Negative for free air or free fluid. Small fat containing umbilical hernia. Musculoskeletal: No acute or significant osseous findings. IMPRESSION: 1. Fluid within the colon consistent with diarrheal illness. No acute wall thickening. 2. Sigmoid colon diverticular disease without acute inflammatory change. 3. Cortical thinning within the kidneys consistent with atrophy. Suspected  parapelvic cysts Aortic Atherosclerosis (ICD10-I70.0). Electronically Signed   By: Donavan Foil M.D.   On: 11/13/2019 02:59      Impression / Plan:   Carol Kent is a 79 y.o. y/o female with a history of diarrhea for 4-6 weeks- negative stool tests for infection. Recently commenced on Leflunomide     Plan   1. Colonoscopy and if negative consider a trial of holding Leflunomide as it is strongly associated with diarrhea.   I have discussed alternative options, risks & benefits,  which include, but are not limited to, bleeding, infection, perforation,respiratory complication & drug reaction.  The patient agrees with this plan & written consent will be obtained.     Thank you for involving me in the care of this patient.      LOS: 1 day   Carol Bellows, MD  11/14/2019, 3:37 PM

## 2019-11-14 NOTE — Progress Notes (Signed)
PROGRESS NOTE  Carol Kent MVH:[id number] DOB: [birth year] DOA: 11/13/2019 PCP: Maryland Pink, MD  HPI/Recap of past 24 hours: Carol C Wilkersonis a 79 y.o.femalewith medical history significant forrecently diagnosed rheumatoid arthritis, HTN who presents with weakness and abnormal kidney function from doctor's office where she presented on 11/07/2019 with a 4-week history of ongoing diarrhea. Patient has several bowel movements preceded by colicky abdominal pain ongoing for several weeks though her stool has formed up in the past few days. She denies fever or chills. Had associated left lower quadrant crampy pain but this has also improved. Denies cough or shortness of breath.   ED Course:On arrival vitals were within normal limits. Blood work significant for creatinine of 2.35, up from baseline of 0.93, with potassium of 2.5. Elevated bilirubin of 1.5, normal WBC of 8.8 with hemoglobin of 11.7. CT abdomen and pelvis indicated for left lower quadrant crampy pain pending at time of decision to admit. Covid PCR pending. Hospitalist consulted for admission.  11/14/19: Seen and examined.  Persistent diarrhea for the last 4 weeks.  C-diff PCR negative.  CT scan abdomen unrevealing.  GI consulted to possibly rule out microscopic colitis.  Colonoscopy planned 8/16.   Assessment/Plan: Principal Problem:   AKI (acute kidney injury) ([facility]) Active Problems:   Hypokalemia   Diarrhea   LLQ pain   Rheumatoid arthritis ([facility]CC)  Chronic diarrhea, rule out microscopic colitis C. difficile PCR negative, GI panel negative GI has been consulted  Post colonoscopy on 11/14/19 by Dr. Vicente Kent Will follow results of biopsy Rest of management per GI  AKI on CKD IV likely prerenal secondary to dehydration from chronic diarrhea Baseline creatinine appears to be 1.8 with GFR of 25 Presented with creatinine of 2.35 with GFR of 19 Currently creatinine is trending down 1.8 Continue to avoid  nephrotoxins and dehydration Continue gentle IV fluid hydration Encourage oral intake Repeat BMP in the morning  Hypokalemia/hypomagnesemia, likely secondary to GI losses Replete as needed  Chronic normocytic anemia Baseline hemoglobin appears to be 11.7 No overt bleeding Hemoglobin 10.7   Code Status: Full code  Family Communication: Daughter at her bedside     Consultants:  GI  Procedures:  Colonoscopy on 09/14/2019 by Dr. Vicente Kent  Antimicrobials:     DVT prophylaxis: SCDs, subcu heparin 3 times daily  Status is: Inpatient    Dispo:  Patient From: Home  Planned Disposition: Home with Health Care Svc  Expected discharge date: 11/15/19  Medically stable for discharge: No, ongoing management for diarrhea         Objective: Vitals:   11/14/19 1620 11/14/19 1640 11/14/19 1650 11/14/19 1700  BP: (!) 90/46 (!) 94/42 (!) 97/45 (!) 99/44  Pulse: 78 68 78 80  Resp: 18 (!) 24 20 (!) 22  Temp:      TempSrc:      SpO2: 100% 100% 99% 100%  Weight:      Height:        Intake/Output Summary (Last 24 hours) at 11/14/2019 1707 Last data filed at 11/13/2019 1920 Gross per 24 hour  Intake 1000 ml  Output --  Net 1000 ml   Filed Weights   11/12/19 1546  Weight: 54.4 kg    Exam:   General: 79 y.o. year-old female well developed well nourished in no acute distress.  Alert and oriented x3.  Cardiovascular: Regular rate and rhythm with no rubs or gallops.  No thyromegaly or JVD noted.    Respiratory: Clear to auscultation with no wheezes  or rales. Good inspiratory effort.  Abdomen: Soft nontender nondistended with normal bowel sounds x4 quadrants.  Musculoskeletal: No lower extremity edema. 2/4 pulses in all 4 extremities.  Psychiatry: Mood is appropriate for condition and setting   Data Reviewed: CBC: Recent Labs  Lab 11/12/19 1557 11/13/19 0906 11/14/19 1107  WBC 8.8 4.5 4.2  HGB 11.7* 10.6* 10.7*  HCT 32.6* 30.5* 31.7*  MCV 86.9 87.9 91.4   PLT 197 144* 269   Basic Metabolic Panel: Recent Labs  Lab 11/12/19 1557 11/13/19 0906 11/14/19 1107  NA 133* 136 139  K 2.8* 2.7* 4.0  CL 97* 101 108  CO2 24 23 21*  GLUCOSE 165* 99 97  BUN 17 16 14   CREATININE 2.35* 1.98* 1.83*  CALCIUM 8.1* 8.0* 8.4*  MG 1.8  --  1.8   GFR: Estimated Creatinine Clearance: 19.1 mL/min (A) (by C-G formula based on SCr of 1.83 mg/dL (H)). Liver Function Tests: Recent Labs  Lab 11/12/19 1557  AST 30  ALT 19  ALKPHOS 128*  BILITOT 1.5*  PROT 6.7  ALBUMIN 3.7   Recent Labs  Lab 11/12/19 1557  LIPASE 34   No results for input(s): AMMONIA in the last 168 hours. Coagulation Profile: No results for input(s): INR, PROTIME in the last 168 hours. Cardiac Enzymes: No results for input(s): CKTOTAL, CKMB, CKMBINDEX, TROPONINI in the last 168 hours. BNP (last 3 results) No results for input(s): PROBNP in the last 8760 hours. HbA1C: Recent Labs    11/13/19 0906  HGBA1C 5.0   CBG: Recent Labs  Lab 11/13/19 0929 11/13/19 1134 11/13/19 1802 11/14/19 0001  GLUCAP 92 102* 114* 104*   Lipid Profile: No results for input(s): CHOL, HDL, LDLCALC, TRIG, CHOLHDL, LDLDIRECT in the last 72 hours. Thyroid Function Tests: No results for input(s): TSH, T4TOTAL, FREET4, T3FREE, THYROIDAB in the last 72 hours. Anemia Panel: No results for input(s): VITAMINB12, FOLATE, FERRITIN, TIBC, IRON, RETICCTPCT in the last 72 hours. Urine analysis:    Component Value Date/Time   COLORURINE STRAW (A) 03/16/2016 1654   APPEARANCEUR CLEAR (A) 03/16/2016 1654   LABSPEC 1.006 03/16/2016 1654   PHURINE 6.0 03/16/2016 1654   GLUCOSEU NEGATIVE 03/16/2016 1654   HGBUR NEGATIVE 03/16/2016 1654   BILIRUBINUR NEGATIVE 03/16/2016 1654   KETONESUR NEGATIVE 03/16/2016 1654   PROTEINUR NEGATIVE 03/16/2016 1654   NITRITE NEGATIVE 03/16/2016 1654   LEUKOCYTESUR NEGATIVE 03/16/2016 1654   Sepsis Labs: @LABRCNTIP (procalcitonin:4,lacticidven:4)  ) Recent Results  (from the past 240 hour(s))  C Difficile Quick Screen w PCR reflex     Status: None   Collection Time: 11/13/19  2:13 AM   Specimen: STOOL  Result Value Ref Range Status   C Diff antigen NEGATIVE NEGATIVE Final   C Diff toxin NEGATIVE NEGATIVE Final   C Diff interpretation No C. difficile detected.  Final    Comment: Performed at [facility], [address] [place], [place] [address]  Gastrointestinal Panel by PCR , Stool     Status: None   Collection Time: 11/13/19  2:13 AM   Specimen: STOOL  Result Value Ref Range Status   Campylobacter species NOT DETECTED NOT DETECTED Final   Plesimonas shigelloides NOT DETECTED NOT DETECTED Final   Salmonella species NOT DETECTED NOT DETECTED Final   Yersinia enterocolitica NOT DETECTED NOT DETECTED Final   Vibrio species NOT DETECTED NOT DETECTED Final   Vibrio cholerae NOT DETECTED NOT DETECTED Final   Enteroaggregative E coli (EAEC) NOT DETECTED NOT DETECTED Final  Enteropathogenic E coli (EPEC) NOT DETECTED NOT DETECTED Final   Enterotoxigenic E coli (ETEC) NOT DETECTED NOT DETECTED Final   Shiga like toxin producing E coli (STEC) NOT DETECTED NOT DETECTED Final   Shigella/Enteroinvasive E coli (EIEC) NOT DETECTED NOT DETECTED Final   Cryptosporidium NOT DETECTED NOT DETECTED Final   Cyclospora cayetanensis NOT DETECTED NOT DETECTED Final   Entamoeba histolytica NOT DETECTED NOT DETECTED Final   Giardia lamblia NOT DETECTED NOT DETECTED Final   Adenovirus F40/41 NOT DETECTED NOT DETECTED Final   Astrovirus NOT DETECTED NOT DETECTED Final   Norovirus GI/GII NOT DETECTED NOT DETECTED Final   Rotavirus A NOT DETECTED NOT DETECTED Final   Sapovirus (I, II, IV, and V) NOT DETECTED NOT DETECTED Final    Comment: Performed at [facility], [address] [place], [place] [address]  SARS Coronavirus 2 by RT PCR (hospital order, performed in [place] hospital lab) Nasopharyngeal Nasopharyngeal Swab     Status: None    Collection Time: 11/13/19  2:13 AM   Specimen: Nasopharyngeal Swab  Result Value Ref Range Status   SARS Coronavirus 2 NEGATIVE NEGATIVE Final    Comment: (NOTE) SARS-CoV-2 target nucleic acids are NOT DETECTED.  The SARS-CoV-2 RNA is generally detectable in upper and lower respiratory specimens during the acute phase of infection. The lowest concentration of SARS-CoV-2 viral copies this assay can detect is 250 copies / mL. A negative result does not preclude SARS-CoV-2 infection and should not be used as the sole basis for treatment or other patient management decisions.  A negative result may occur with improper specimen collection / handling, submission of specimen other than nasopharyngeal swab, presence of viral mutation(s) within the areas targeted by this assay, and inadequate number of viral copies (<250 copies / mL). A negative result must be combined with clinical observations, patient history, and epidemiological information.  Fact Sheet for Patients:   StrictlyIdeas.no  Fact Sheet for Healthcare Providers: BankingDealers.co.za  This test is not yet approved or  cleared by the [place] FDA and has been authorized for detection and/or diagnosis of SARS-CoV-2 by FDA under an Emergency Use Authorization (EUA).  This EUA will remain in effect (meaning this test can be used) for the duration of the COVID-19 declaration under Section 564(b)(1) of the Act, 21 U.S.C. section 360bbb-3(b)(1), unless the authorization is terminated or revoked sooner.  Performed at [facility], [address], [place],  [address]       Studies: No results found.  Scheduled Meds:  [MAR Hold] heparin injection (subcutaneous)  5,000 Units Subcutaneous Q8H   [MAR Hold] insulin aspart  0-5 Units Subcutaneous QHS   [MAR Hold] insulin aspart  0-9 Units Subcutaneous TID WC   [MAR Hold] potassium chloride  40 mEq Oral TID     Continuous Infusions:  lactated ringers with kcl 50 mL/hr at 11/13/19 1037     LOS: 1 day     Kayleen Memos, MD [facility] Pager [phone]  If 7PM-7AM, please contact night-coverage [email or web address] Password [email or web address] 11/14/2019, 5:07 PM

## 2019-11-14 NOTE — Progress Notes (Signed)
PT Cancellation Note  Patient Details Name: Carol Kent MRN: [id number] DOB: [birth year]   Cancelled Treatment:    Reason Eval/Treat Not Completed: Patient at procedure or test/unavailable. Patient is having colonoscopy procedure. PT to follow up as appropriate.    Minna Merritts, PT, MPT  Percell Locus 11/14/2019, 1:54 PM

## 2019-11-14 NOTE — OR Nursing (Signed)
NO. 22 IV BY PMCVEY RN. @1645 . Report to nurse on floor.

## 2019-11-14 NOTE — Anesthesia Postprocedure Evaluation (Signed)
Anesthesia Post Note  Patient: Carol Kent  Procedure(s) Performed: COLONOSCOPY WITH PROPOFOL (N/A )  Patient location during evaluation: Endoscopy Anesthesia Type: General Level of consciousness: awake and alert Pain management: pain level controlled Vital Signs Assessment: post-procedure vital signs reviewed and stable Respiratory status: spontaneous breathing, nonlabored ventilation, respiratory function stable and patient connected to nasal cannula oxygen Cardiovascular status: blood pressure returned to baseline and stable Postop Assessment: no apparent nausea or vomiting Anesthetic complications: no   No complications documented.   Last Vitals:  Vitals:   11/14/19 2010 11/14/19 2014  BP: (!) 120/36 (!) 117/53  Pulse: 81 80  Resp: 16   Temp: (!) 36.4 C   SpO2: 99%     Last Pain:  Vitals:   11/14/19 2010  TempSrc: Oral  PainSc:                  Martha Clan

## 2019-11-15 ENCOUNTER — Encounter: Payer: Self-pay | Admitting: Gastroenterology

## 2019-11-15 LAB — BASIC METABOLIC PANEL
Anion gap: 6 (ref 5–15)
BUN: 11 mg/dL (ref 8–23)
CO2: 20 mmol/L — ABNORMAL LOW (ref 22–32)
Calcium: 8 mg/dL — ABNORMAL LOW (ref 8.9–10.3)
Chloride: 111 mmol/L (ref 98–111)
Creatinine, Ser: 1.82 mg/dL — ABNORMAL HIGH (ref 0.44–1.00)
GFR calc Af Amer: 30 mL/min — ABNORMAL LOW (ref >60)
GFR calc non Af Amer: 26 mL/min — ABNORMAL LOW (ref >60)
Glucose, Bld: 91 mg/dL (ref 70–99)
Potassium: 4 mmol/L (ref 3.5–5.1)
Sodium: 137 mmol/L (ref 135–145)

## 2019-11-15 LAB — IRON AND TIBC
Iron: 89 ug/dL (ref 28–170)
Saturation Ratios: 49 % — ABNORMAL HIGH (ref 10.4–31.8)
TIBC: 181 ug/dL — ABNORMAL LOW (ref 250–450)
UIBC: 92 ug/dL

## 2019-11-15 LAB — FOLATE: Folate: 7.5 ng/mL (ref >5.9)

## 2019-11-15 LAB — CBC
HCT: 28.6 % — ABNORMAL LOW (ref 36.0–46.0)
Hemoglobin: 9.6 g/dL — ABNORMAL LOW (ref 12.0–15.0)
MCH: 31.1 pg (ref 26.0–34.0)
MCHC: 33.6 g/dL (ref 30.0–36.0)
MCV: 92.6 fL (ref 80.0–100.0)
Platelets: 146 10*3/uL — ABNORMAL LOW (ref 150–400)
RBC: 3.09 MIL/uL — ABNORMAL LOW (ref 3.87–5.11)
RDW: 13.3 % (ref 11.5–15.5)
WBC: 4.2 10*3/uL (ref 4.0–10.5)
nRBC: 0 % (ref 0.0–0.2)

## 2019-11-15 LAB — GLUCOSE, CAPILLARY
Glucose-Capillary: 88 mg/dL (ref 70–99)
Glucose-Capillary: 102 mg/dL — ABNORMAL HIGH (ref 70–99)
Glucose-Capillary: 93 mg/dL (ref 70–99)
Glucose-Capillary: 115 mg/dL — ABNORMAL HIGH (ref 70–99)

## 2019-11-15 LAB — FERRITIN: Ferritin: 296 ng/mL (ref 11–307)

## 2019-11-15 LAB — VITAMIN B12: Vitamin B-12: 122 pg/mL — ABNORMAL LOW (ref 180–914)

## 2019-11-15 MED ORDER — KCL IN DEXTROSE-NACL 20-5-0.45 MEQ/L-%-% IV SOLN
INTRAVENOUS | Status: DC
  Filled 2019-11-15 (×2): qty 1000

## 2019-11-15 NOTE — Evaluation (Signed)
Physical Therapy Evaluation Patient Details Name: Carol Kent MRN: [id number] DOB: December 27, 1940 Today's Date: 11/15/2019   History of Present Illness  Pt is an 79 y.o. female presenting to hospital 8/14 with diarrhea x4 weeks, weakness, dehydration, LLQ abdominal pain, and abnormal labs (kidney function).  Pt admitted with AKI and hypokalemia.  PMH includes h/o brain aneurysm, h/o cervical fx, RA, htn.  Clinical Impression  Prior to hospital admission, pt was independent with functional mobility (held onto furniture in home as needed for balance); lives alone in 1 level home with 9 STE B railings.  Currently pt is independent with transfers and CGA ambulating 150 feet in room no AD (pt occasionally holding onto furniture for balance as needed; mild unsteadiness with turns in room initially but improved with practice).  Pt was in bathroom upon PT arrival and pt's daughter reports she has been helping pt to/from bathroom d/t pt needing to toilet frequently (d/t diarrhea).  Pt would benefit from skilled PT to address noted impairments and functional limitations (see below for any additional details).  Upon hospital discharge, pt would benefit from [place].    Follow Up Recommendations Home health PT    Equipment Recommendations  None recommended by PT    Recommendations for Other Services       Precautions / Restrictions Precautions Precautions: Fall Restrictions Weight Bearing Restrictions: No      Mobility  Bed Mobility Overal bed mobility: Modified Independent             General bed mobility comments: Semi-supine to/from sitting edge of bed with HOB elevated  Transfers Overall transfer level: Independent Equipment used: None             General transfer comment: fairly strong stand noted from bed  Ambulation/Gait Ambulation/Gait assistance: Min guard Gait Distance (Feet): 150 Feet (in room) Assistive device: None Gait Pattern/deviations: Step-through pattern Gait  velocity: decreased   General Gait Details: pt occasionally holding onto furniture in room for balance; mild unsteadiness with turns in room initially but improved with repetition (CGA provided for safety)  Stairs            Wheelchair Mobility    Modified Rankin (Stroke Patients Only)       Balance Overall balance assessment: Needs assistance Sitting-balance support: No upper extremity supported;Feet supported Sitting balance-Leahy Scale: Normal Sitting balance - Comments: steady sitting reaching outside BOS   Standing balance support: No upper extremity supported Standing balance-Leahy Scale: Good Standing balance comment: steady standing reaching within BOS                             Pertinent Vitals/Pain Pain Assessment: No/denies pain  Vitals (HR and O2 on room air) stable and WFL throughout treatment session.    Home Living Family/patient expects to be discharged to:: Private residence Living Arrangements: Alone Available Help at Discharge: Family (daughter works for EMS and lives about 1 mile away) Type of Home: House Home Access: Stairs to enter Entrance Stairs-Rails: Right;Left;Can reach both Technical brewer of Steps: [place]: One level [place]: Bedside commode      Prior Function Level of Independence: Independent         Comments: Per pt description, pt appears to hold onto furniture in home as needed for balance during ambulation.  No recent falls but pt does report h/o losing balance but able to catch herself.     Hand Dominance  Extremity/Trunk Assessment   Upper Extremity Assessment Upper Extremity Assessment: Generalized weakness    Lower Extremity Assessment Lower Extremity Assessment: Generalized weakness    Cervical / Trunk Assessment Cervical / Trunk Assessment: Normal  Communication   Communication: No difficulties  Cognition Arousal/Alertness: Awake/alert Behavior During Therapy: WFL  for tasks assessed/performed Overall Cognitive Status: Within Functional Limits for tasks assessed                                        General Comments   Nursing cleared pt for participation in physical therapy.  Pt agreeable to PT session in hospital room.  Pt's daughter present during session.    Exercises     Assessment/Plan    PT Assessment Patient needs continued PT services  PT Problem List Decreased strength;Decreased balance;Decreased mobility       PT Treatment Interventions DME instruction;Gait training;Stair training;Functional mobility training;Therapeutic activities;Therapeutic exercise;Balance training;Patient/family education    PT Goals (Current goals can be found in the Care Plan section)  Acute Rehab PT Goals Patient Stated Goal: to go home; to improve diarrhea PT Goal Formulation: With patient Time For Goal Achievement: 11/29/19 Potential to Achieve Goals: Good    Frequency Min 2X/week   Barriers to discharge        Co-evaluation               AM-PAC PT "6 Clicks" Mobility  Outcome Measure Help needed turning from your back to your side while in a flat bed without using bedrails?: None Help needed moving from lying on your back to sitting on the side of a flat bed without using bedrails?: None Help needed moving to and from a bed to a chair (including a wheelchair)?: A Little Help needed standing up from a chair using your arms (e.g., wheelchair or bedside chair)?: A Little Help needed to walk in hospital room?: A Little Help needed climbing 3-5 steps with a railing? : A Little 6 Click Score: 20    End of Session Equipment Utilized During Treatment: Gait belt Activity Tolerance: Patient tolerated treatment well Patient left: in bed;with call bell/phone within reach;with family/visitor present Nurse Communication: Mobility status;Precautions PT Visit Diagnosis: Unsteadiness on feet (R26.81);Muscle weakness (generalized)  (M62.81)    Time: 1655-3748 PT Time Calculation (min) (ACUTE ONLY): 29 min   Charges:   PT Evaluation $PT Eval Low Complexity: 1 Low         Vinton Layson, PT 11/15/19, 11:16 AM

## 2019-11-15 NOTE — Progress Notes (Addendum)
PROGRESS NOTE  MARYAN SIVAK ZDG:[id number] DOB: 1940-07-19 DOA: 11/13/2019 PCP: Maryland Pink, MD  HPI/Recap of past 24 hours: RJJ:OACZ C Wilkersonis a 79 y.o.femalewith medical history significant forrecently diagnosed rheumatoid arthritis, HTN who presents with weakness and abnormal kidney function from doctor's office where she presented on 11/07/2019 with a 4-week history of ongoing diarrhea. Patient has several bowel movements preceded by colicky abdominal pain ongoing for several weeks though her stool has formed up in the past few days. She denies fever or chills. Had associated left lower quadrant crampy pain.  ED Course:Labs significant for creatinine of 2.35, with potassium of 2.5. Elevated bilirubin of 1.5, normal WBC of 8.8 with hemoglobin of 11.7. Hospitalist consulted for admission.  Persistent diarrhea.  C-diff PCR negative.  CT scan abdomen unrevealing.  GI consulted, colonoscopy done on 11/14/19 showed  angioectasias with no bleeding.  Biopsy taken, awaiting results.    11/15/19:  Seen and examined with her daughter at her bedside.  Reports persistent diarrhea and poor appetite.  Restarted IV fluids and encouraged oral intake as tolerated.  Assessment/Plan: Principal Problem:   AKI (acute kidney injury) ([place]) Active Problems:   Hypokalemia   Diarrhea   LLQ pain   Rheumatoid arthritis (HCC)  Chronic diarrhea, non infective, rule out medication induced, leflunomide versus others  C. difficile PCR negative, GI panel negative Post colonoscopy on 11/14/19 by Dr. Vicente Males, GI. Non bleeding angioectasias.  Biopsy taken, awaiting results.  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 finding 11/14/19 Management per GI  AKI on CKD IV likely prerenal secondary to dehydration from chronic diarrhea Baseline creatinine appears to be 1.8 with GFR of 25 Presented with creatinine of 2.35 with GFR of  19 Currently creatinine is trending down 1.8 Continue to avoid nephrotoxins and dehydration Continue gentle IV fluid hydration Encourage oral intake Repeat BMP in the morning  RA Continue to hold off leflunomide, could induce diarrhea  Resolved post repletion: Hypokalemia/hypomagnesemia, likely secondary to GI losses Replete as needed  Chronic normocytic anemia Baseline hemoglobin appears to be 11.7 No overt bleeding Hemoglobin downtrending, 9.6 from 10.7 Possibly dilutional No overt bleeding Continue to monitor H&H, GI following.  Physical debility PT recommended home health PT TOC consulted to assist with home health services.   Code Status: Full code  Family Communication: Daughter at her bedside     Consultants:  GI  Procedures:  Colonoscopy on 09/14/2019 by Dr. Vicente Males  Antimicrobials:     DVT prophylaxis: SCDs, subcu heparin 3 times daily  Status is: Inpatient    Dispo:  Patient From: Home  Planned Disposition: Home  Expected discharge date: 11/16/19  Medically stable for discharge: No, ongoing management for diarrhea         Objective: Vitals:   11/14/19 2014 11/15/19 0434 11/15/19 0745 11/15/19 1205  BP: (!) 117/53 (!) 95/47 (!) 105/57 (!) 122/46  Pulse: 80 83 78 79  Resp:  16 18 16   Temp:  97.9 F (36.6 C) 98 F (36.7 C) 97.8 F (36.6 C)  TempSrc:  Oral Oral Oral  SpO2:  97% 97% 100%  Weight:      Height:        Intake/Output Summary (Last 24 hours) at 11/15/2019 1442 Last data filed at 11/15/2019 1000 Gross per 24 hour  Intake 240 ml  Output --  Net 240 ml   Filed Weights   11/12/19 1546  Weight: 54.4 kg  Exam:  . General: 79 y.o. year-old female pleasant no acute distress.  Alert and oriented x3.   . Cardiovascular: Regular rate and rhythm no rubs or gallops. [name] Respiratory: Clear to auscultation no wheezes or rales.   . Abdomen: Soft nontender normal bowel sounds present. . Musculoskeletal: No lower extremity  edema bilaterally.   [name] Psychiatry: Mood is appropriate for condition and setting.   Data Reviewed: CBC: Recent Labs  Lab 11/12/19 1557 11/13/19 0906 11/14/19 1107 11/15/19 0537  WBC 8.8 4.5 4.2 4.2  HGB 11.7* 10.6* 10.7* 9.6*  HCT 32.6* 30.5* 31.7* 28.6*  MCV 86.9 87.9 91.4 92.6  PLT 197 144* 154 950*   Basic Metabolic Panel: Recent Labs  Lab 11/12/19 1557 11/13/19 0906 11/14/19 1107 11/15/19 0537  NA 133* 136 139 137  K 2.8* 2.7* 4.0 4.0  CL 97* 101 108 111  CO2 24 23 21* 20*  GLUCOSE 165* 99 97 91  BUN 17 16 14 11   CREATININE 2.35* 1.98* 1.83* 1.82*  CALCIUM 8.1* 8.0* 8.4* 8.0*  MG 1.8  --  1.8  --    GFR: Estimated Creatinine Clearance: 19.2 mL/min (A) (by C-G formula based on SCr of 1.82 mg/dL (H)). Liver Function Tests: Recent Labs  Lab 11/12/19 1557  AST 30  ALT 19  ALKPHOS 128*  BILITOT 1.5*  PROT 6.7  ALBUMIN 3.7   Recent Labs  Lab 11/12/19 1557  LIPASE 34   No results for input(s): AMMONIA in the last 168 hours. Coagulation Profile: No results for input(s): INR, PROTIME in the last 168 hours. Cardiac Enzymes: No results for input(s): CKTOTAL, CKMB, CKMBINDEX, TROPONINI in the last 168 hours. BNP (last 3 results) No results for input(s): PROBNP in the last 8760 hours. HbA1C: Recent Labs    11/13/19 0906  HGBA1C 5.0   CBG: Recent Labs  Lab 11/14/19 0001 11/14/19 1806 11/14/19 2203 11/15/19 0744 11/15/19 1205  GLUCAP 104* 100* 92 88 102*   Lipid Profile: No results for input(s): CHOL, HDL, LDLCALC, TRIG, CHOLHDL, LDLDIRECT in the last 72 hours. Thyroid Function Tests: No results for input(s): TSH, T4TOTAL, FREET4, T3FREE, THYROIDAB in the last 72 hours. Anemia Panel: Recent Labs    11/15/19 0537  VITAMINB12 122*  FOLATE 7.5  FERRITIN 296  TIBC 181*  IRON 89   Urine analysis:    Component Value Date/Time   COLORURINE STRAW (A) 03/16/2016 1654   APPEARANCEUR CLEAR (A) 03/16/2016 1654   LABSPEC 1.006 03/16/2016 1654    PHURINE 6.0 03/16/2016 1654   GLUCOSEU NEGATIVE 03/16/2016 1654   HGBUR NEGATIVE 03/16/2016 1654   BILIRUBINUR NEGATIVE 03/16/2016 1654   KETONESUR NEGATIVE 03/16/2016 1654   PROTEINUR NEGATIVE 03/16/2016 1654   NITRITE NEGATIVE 03/16/2016 1654   LEUKOCYTESUR NEGATIVE 03/16/2016 1654   Sepsis Labs: @LABRCNTIP (procalcitonin:4,lacticidven:4)  ) Recent Results (from the past 240 hour(s))  C Difficile Quick Screen w PCR reflex     Status: None   Collection Time: 11/13/19  2:13 AM   Specimen: STOOL  Result Value Ref Range Status   C Diff antigen NEGATIVE NEGATIVE Final   C Diff toxin NEGATIVE NEGATIVE Final   C Diff interpretation No C. difficile detected.  Final    Comment: Performed at [facility], [address] [place], [place] [address]  Gastrointestinal Panel by PCR , Stool     Status: None   Collection Time: 11/13/19  2:13 AM   Specimen: STOOL  Result Value Ref Range Status   Campylobacter species NOT DETECTED  NOT DETECTED Final   Plesimonas shigelloides NOT DETECTED NOT DETECTED Final   Salmonella species NOT DETECTED NOT DETECTED Final   Yersinia enterocolitica NOT DETECTED NOT DETECTED Final   Vibrio species NOT DETECTED NOT DETECTED Final   Vibrio cholerae NOT DETECTED NOT DETECTED Final   Enteroaggregative E coli (EAEC) NOT DETECTED NOT DETECTED Final   Enteropathogenic E coli (EPEC) NOT DETECTED NOT DETECTED Final   Enterotoxigenic E coli (ETEC) NOT DETECTED NOT DETECTED Final   Shiga like toxin producing E coli (STEC) NOT DETECTED NOT DETECTED Final   Shigella/Enteroinvasive E coli (EIEC) NOT DETECTED NOT DETECTED Final   Cryptosporidium NOT DETECTED NOT DETECTED Final   Cyclospora cayetanensis NOT DETECTED NOT DETECTED Final   Entamoeba histolytica NOT DETECTED NOT DETECTED Final   Giardia lamblia NOT DETECTED NOT DETECTED Final   Adenovirus F40/41 NOT DETECTED NOT DETECTED Final   Astrovirus NOT DETECTED NOT DETECTED Final   Norovirus GI/GII NOT  DETECTED NOT DETECTED Final   Rotavirus A NOT DETECTED NOT DETECTED Final   Sapovirus (I, II, IV, and V) NOT DETECTED NOT DETECTED Final    Comment: Performed at [facility], [address] [place], [place] [address]  SARS Coronavirus 2 by RT PCR (hospital order, performed in [place] hospital lab) Nasopharyngeal Nasopharyngeal Swab     Status: None   Collection Time: 11/13/19  2:13 AM   Specimen: Nasopharyngeal Swab  Result Value Ref Range Status   SARS Coronavirus 2 NEGATIVE NEGATIVE Final    Comment: (NOTE) SARS-CoV-2 target nucleic acids are NOT DETECTED.  The SARS-CoV-2 RNA is generally detectable in upper and lower respiratory specimens during the acute phase of infection. The lowest concentration of SARS-CoV-2 viral copies this assay can detect is 250 copies / mL. A negative result does not preclude SARS-CoV-2 infection and should not be used as the sole basis for treatment or other patient management decisions.  A negative result may occur with improper specimen collection / handling, submission of specimen other than nasopharyngeal swab, presence of viral mutation(s) within the areas targeted by this assay, and inadequate number of viral copies (<250 copies / mL). A negative result must be combined with clinical observations, patient history, and epidemiological information.  Fact Sheet for Patients:   StrictlyIdeas.no  Fact Sheet for Healthcare Providers: BankingDealers.co.za  This test is not yet approved or  cleared by the [place] FDA and has been authorized for detection and/or diagnosis of SARS-CoV-2 by FDA under an Emergency Use Authorization (EUA).  This EUA will remain in effect (meaning this test can be used) for the duration of the COVID-19 declaration under Section 564(b)(1) of the Act, 21 U.S.C. section 360bbb-3(b)(1), unless the authorization is terminated or revoked sooner.  Performed at  [facility], [address] [place], [place] [address]       Studies: No results found.  Scheduled Meds: . heparin injection (subcutaneous)  5,000 Units Subcutaneous Q8H  . insulin aspart  0-5 Units Subcutaneous QHS  . insulin aspart  0-9 Units Subcutaneous TID WC    Continuous Infusions: . dextrose 5 % and 0.45 % NaCl with KCl 20 mEq/L       LOS: 2 days     Kayleen Memos, MD [facility] Pager [phone]  If 7PM-7AM, please contact night-coverage [email or web address] Password [facility] 11/15/2019, 2:42 PM

## 2019-11-16 DIAGNOSIS — D519 Vitamin B12 deficiency anemia, unspecified: Secondary | ICD-10-CM

## 2019-11-16 DIAGNOSIS — M069 Rheumatoid arthritis, unspecified: Secondary | ICD-10-CM

## 2019-11-16 DIAGNOSIS — E876 Hypokalemia: Secondary | ICD-10-CM

## 2019-11-16 DIAGNOSIS — D61818 Other pancytopenia: Secondary | ICD-10-CM

## 2019-11-16 DIAGNOSIS — N189 Chronic kidney disease, unspecified: Secondary | ICD-10-CM

## 2019-11-16 LAB — CBC
HCT: 26.4 % — ABNORMAL LOW (ref 36.0–46.0)
Hemoglobin: 8.7 g/dL — ABNORMAL LOW (ref 12.0–15.0)
MCH: 30.7 pg (ref 26.0–34.0)
MCHC: 33 g/dL (ref 30.0–36.0)
MCV: 93.3 fL (ref 80.0–100.0)
Platelets: 137 10*3/uL — ABNORMAL LOW (ref 150–400)
RBC: 2.83 MIL/uL — ABNORMAL LOW (ref 3.87–5.11)
RDW: 13.3 % (ref 11.5–15.5)
WBC: 3.7 10*3/uL — ABNORMAL LOW (ref 4.0–10.5)
nRBC: 0 % (ref 0.0–0.2)

## 2019-11-16 LAB — BASIC METABOLIC PANEL
Anion gap: 7 (ref 5–15)
BUN: 10 mg/dL (ref 8–23)
CO2: 20 mmol/L — ABNORMAL LOW (ref 22–32)
Calcium: 7.7 mg/dL — ABNORMAL LOW (ref 8.9–10.3)
Chloride: 113 mmol/L — ABNORMAL HIGH (ref 98–111)
Creatinine, Ser: 1.75 mg/dL — ABNORMAL HIGH (ref 0.44–1.00)
GFR calc Af Amer: 31 mL/min — ABNORMAL LOW (ref >60)
GFR calc non Af Amer: 27 mL/min — ABNORMAL LOW (ref >60)
Glucose, Bld: 90 mg/dL (ref 70–99)
Potassium: 3.3 mmol/L — ABNORMAL LOW (ref 3.5–5.1)
Sodium: 140 mmol/L (ref 135–145)

## 2019-11-16 LAB — GLUCOSE, CAPILLARY
Glucose-Capillary: 94 mg/dL (ref 70–99)
Glucose-Capillary: 109 mg/dL — ABNORMAL HIGH (ref 70–99)
Glucose-Capillary: 91 mg/dL (ref 70–99)
Glucose-Capillary: 98 mg/dL (ref 70–99)

## 2019-11-16 LAB — SURGICAL PATHOLOGY

## 2019-11-16 MED ORDER — POTASSIUM CHLORIDE CRYS ER 20 MEQ PO TBCR
40.0000 meq | EXTENDED_RELEASE_TABLET | Freq: Once | ORAL | Status: AC
  Administered 2019-11-16: 40 meq via ORAL
  Filled 2019-11-16: qty 2

## 2019-11-16 MED ORDER — CYANOCOBALAMIN 1000 MCG/ML IJ SOLN
1000.0000 ug | Freq: Every day | INTRAMUSCULAR | Status: DC
  Administered 2019-11-16 – 2019-11-17 (×2): 1000 ug via INTRAMUSCULAR
  Filled 2019-11-16 (×2): qty 1

## 2019-11-16 MED ORDER — LOPERAMIDE HCL 2 MG PO CAPS
4.0000 mg | ORAL_CAPSULE | Freq: Four times a day (QID) | ORAL | Status: DC | PRN
  Administered 2019-11-16: 11:00:00 4 mg via ORAL
  Filled 2019-11-16: qty 2

## 2019-11-16 NOTE — Care Management Important Message (Signed)
Important Message  Patient Details  Name: Carol Kent MRN: [id number] Date of Birth: Nov 25, 1940   Medicare Important Message Given:  Yes  Initial Medicare IM given by Patient Access Associate on 11/15/2019 at 9:25am.   Dannette Barbara 11/16/2019, 8:13 AM

## 2019-11-16 NOTE — Plan of Care (Signed)

## 2019-11-16 NOTE — Progress Notes (Signed)
Patient ID: AHLEY Kent, female   DOB: March 24, 1941, 79 y.o.   MRN: [id number] [facility] PROGRESS NOTE  [name] TMH:[id number] DOB: 1940-12-13 DOA: 11/13/2019 PCP: Maryland Pink, MD  HPI/Subjective: Patient had 10 episodes of diarrhea yesterday.  Patient had 3 episodes of diarrhea this morning before I saw her and another 3 more after that.  Otherwise feels okay.  Eating better.  No abdominal pain.  Objective: Vitals:   11/16/19 0743 11/16/19 1241  BP: (!) 105/49 (!) 121/57  Pulse: 73 84  Resp: 16 17  Temp: 98.1 F (36.7 C)   SpO2: 98% 97%   No intake or output data in the 24 hours ending 11/16/19 1636 Filed Weights   11/12/19 1546  Weight: 54.4 kg    ROS: Review of Systems  Respiratory: Negative for shortness of breath.   Cardiovascular: Negative for chest pain.  Gastrointestinal: Positive for diarrhea. Negative for abdominal pain, nausea and vomiting.   Exam: Physical Exam HENT:     Nose: No mucosal edema.     Mouth/Throat:     Pharynx: No oropharyngeal exudate.  Eyes:     General: Lids are normal.     Conjunctiva/sclera: Conjunctivae normal.     Pupils: Pupils are equal, round, and reactive to light.  Cardiovascular:     Rate and Rhythm: Normal rate and regular rhythm.     Heart sounds: Normal heart sounds, S1 normal and S2 normal.  Pulmonary:     Breath sounds: No decreased breath sounds, wheezing, rhonchi or rales.  Abdominal:     Palpations: Abdomen is soft.     Tenderness: There is no abdominal tenderness.  Musculoskeletal:     Right lower leg: No swelling.     Left lower leg: No swelling.  Skin:    General: Skin is warm.     Findings: No rash.  Neurological:     Mental Status: She is alert and oriented to person, place, and time.       Data Reviewed: Basic Metabolic Panel: Recent Labs  Lab 11/12/19 1557 11/13/19 0906 11/14/19 1107 11/15/19 0537 11/16/19 0431  NA 133* 136 139 137 140  K 2.8* 2.7* 4.0 4.0 3.3*  CL 97* 101  108 111 113*  CO2 24 23 21* 20* 20*  GLUCOSE 165* 99 97 91 90  BUN 17 16 14 11 10   CREATININE 2.35* 1.98* 1.83* 1.82* 1.75*  CALCIUM 8.1* 8.0* 8.4* 8.0* 7.7*  MG 1.8  --  1.8  --   --    Liver Function Tests: Recent Labs  Lab 11/12/19 1557  AST 30  ALT 19  ALKPHOS 128*  BILITOT 1.5*  PROT 6.7  ALBUMIN 3.7   Recent Labs  Lab 11/12/19 1557  LIPASE 34   CBC: Recent Labs  Lab 11/12/19 1557 11/13/19 0906 11/14/19 1107 11/15/19 0537 11/16/19 0431  WBC 8.8 4.5 4.2 4.2 3.7*  HGB 11.7* 10.6* 10.7* 9.6* 8.7*  HCT 32.6* 30.5* 31.7* 28.6* 26.4*  MCV 86.9 87.9 91.4 92.6 93.3  PLT 197 144* 154 146* 137*   CBG: Recent Labs  Lab 11/15/19 1205 11/15/19 1656 11/15/19 2042 11/16/19 0742 11/16/19 1201  GLUCAP 102* 93 115* 94 109*    Recent Results (from the past 240 hour(s))  C Difficile Quick Screen w PCR reflex     Status: None   Collection Time: 11/13/19  2:13 AM   Specimen: STOOL  Result Value Ref Range Status   C Diff antigen NEGATIVE NEGATIVE Final  C Diff toxin NEGATIVE NEGATIVE Final   C Diff interpretation No C. difficile detected.  Final    Comment: Performed at [facility], [address] [place], [place] [address]  Gastrointestinal Panel by PCR , Stool     Status: None   Collection Time: 11/13/19  2:13 AM   Specimen: STOOL  Result Value Ref Range Status   Campylobacter species NOT DETECTED NOT DETECTED Final   Plesimonas shigelloides NOT DETECTED NOT DETECTED Final   Salmonella species NOT DETECTED NOT DETECTED Final   Yersinia enterocolitica NOT DETECTED NOT DETECTED Final   Vibrio species NOT DETECTED NOT DETECTED Final   Vibrio cholerae NOT DETECTED NOT DETECTED Final   Enteroaggregative E coli (EAEC) NOT DETECTED NOT DETECTED Final   Enteropathogenic E coli (EPEC) NOT DETECTED NOT DETECTED Final   Enterotoxigenic E coli (ETEC) NOT DETECTED NOT DETECTED Final   Shiga like toxin producing E coli (STEC) NOT DETECTED NOT DETECTED Final    Shigella/Enteroinvasive E coli (EIEC) NOT DETECTED NOT DETECTED Final   Cryptosporidium NOT DETECTED NOT DETECTED Final   Cyclospora cayetanensis NOT DETECTED NOT DETECTED Final   Entamoeba histolytica NOT DETECTED NOT DETECTED Final   Giardia lamblia NOT DETECTED NOT DETECTED Final   Adenovirus F40/41 NOT DETECTED NOT DETECTED Final   Astrovirus NOT DETECTED NOT DETECTED Final   Norovirus GI/GII NOT DETECTED NOT DETECTED Final   Rotavirus A NOT DETECTED NOT DETECTED Final   Sapovirus (I, II, IV, and V) NOT DETECTED NOT DETECTED Final    Comment: Performed at [facility], [address] [place], [place] [address]  SARS Coronavirus 2 by RT PCR (hospital order, performed in [place] hospital lab) Nasopharyngeal Nasopharyngeal Swab     Status: None   Collection Time: 11/13/19  2:13 AM   Specimen: Nasopharyngeal Swab  Result Value Ref Range Status   SARS Coronavirus 2 NEGATIVE NEGATIVE Final    Comment: (NOTE) SARS-CoV-2 target nucleic acids are NOT DETECTED.  The SARS-CoV-2 RNA is generally detectable in upper and lower respiratory specimens during the acute phase of infection. The lowest concentration of SARS-CoV-2 viral copies this assay can detect is 250 copies / mL. A negative result does not preclude SARS-CoV-2 infection and should not be used as the sole basis for treatment or other patient management decisions.  A negative result may occur with improper specimen collection / handling, submission of specimen other than nasopharyngeal swab, presence of viral mutation(s) within the areas targeted by this assay, and inadequate number of viral copies (<250 copies / mL). A negative result must be combined with clinical observations, patient history, and epidemiological information.  Fact Sheet for Patients:   StrictlyIdeas.no  Fact Sheet for Healthcare Providers: BankingDealers.co.za  This test is not yet approved or   cleared by the [place] FDA and has been authorized for detection and/or diagnosis of SARS-CoV-2 by FDA under an Emergency Use Authorization (EUA).  This EUA will remain in effect (meaning this test can be used) for the duration of the COVID-19 declaration under Section 564(b)(1) of the Act, 21 U.S.C. section 360bbb-3(b)(1), unless the authorization is terminated or revoked sooner.  Performed at [facility], [address] [place], [place] [address]      Scheduled Meds: . heparin injection (subcutaneous)  5,000 Units Subcutaneous Q8H  . insulin aspart  0-5 Units Subcutaneous QHS  . insulin aspart  0-9 Units Subcutaneous TID WC   Continuous Infusions:  Assessment/Plan:  1. Acute now chronic diarrhea.  Colonoscopy pathology still pending.  C. difficile and GI panel negative.  I started Imodium. Holding leflunomide.  Wondering if this could be microscopic colitis. 2. Acute kidney injury on chronic kidney disease stage IV.  Stop IV fluids. 3. Hypokalemia replace potassium orally 4. Vitamin B12 deficiency.  Will give IM B12 while here.  And then switch over to oral upon discharge home. 5. Hemoglobin A1c low.  Patient not a diabetic.  Discontinue sliding scale insulin. 6. Hypertension history.  Blood pressure on the lower side.  Continue to monitor closely. 7. Pancytopenia.  Wondering if this is secondary to B12 deficiency.  Continue to monitor counts. 8. History of rheumatoid arthritis holding leflunomide.    Code Status:     Code Status Orders  (From admission, onward)         Start     Ordered   11/13/19 0119  Full code  Continuous        11/13/19 0121        Code Status History    This patient has a current code status but no historical code status.   [facility] Care Planning Activity     Family Communication: Spoke with daughter at the bedside and on the phone this afternoon Disposition Plan: Status is: Inpatient  Dispo:  Patient From:  Home  Planned Disposition: Home  Expected discharge date: 11/17/19  Medically being followed closely for diarrhea.  Too many episodes of diarrhea even after colonoscopy.  Started Imodium today.  Consultants:  Gastroenterology  Procedures:  Colonoscopy  Time spent: 28 minutes  Dora Simeone [employer]

## 2019-11-16 NOTE — TOC Initial Note (Signed)
Transition of Care [facility]) - Initial/Assessment Note    Patient Details  Name: Carol Kent MRN: [id number] Date of Birth: 08/06/1940  Transition of Care [facility]) CM/SW Contact:    Shelbie Hutching, RN Phone Number: 11/16/2019, 11:27 AM  Clinical Narrative:                 Patient admitted with acute kidney injury related to dehydration from chronic diarrhea.  Patient is from home where she lives alone with her dog.  Patient reports that she is very independent at home, drives and requires no assistive devices. Patient is current with PCP [facility] [place] [facility].  Patient agrees to home health services as recommended by PT.  [name] with [facility] has accepted referral for home health services PT and OT.  [name] will provide transportation home at discharge.     Expected Discharge Plan: [place] Barriers to Discharge: Continued Medical Work up   Patient Goals and CMS Choice Patient states their goals for this hospitalization and ongoing recovery are:: Patient wants to get back home CMS Medicare.gov Compare Post Acute Care list provided to:: Patient Choice offered to / list presented to : Patient  Expected Discharge Plan and Services Expected Discharge Plan: [place]   Discharge Planning Services: CM Consult Post Acute Care Choice: [place] arrangements for the past 2 months: [place] Arranged: PT, OT [place] Agency: [place] ([place]) Date [place]: 11/16/19 Time [place]: [id number] Representative spoke with at [place]: Floydene Flock  Prior Living Arrangements/Services Living arrangements for the past 2 months: [place] with:: Self Patient language and need for interpreter reviewed:: Yes Do you feel safe going back to the place where you live?: Yes      Need for Family Participation in Patient Care: Yes (Comment)  (AKI) Care giver support system in place?: Yes (comment) (daughters, grandson)   Criminal Activity/Legal Involvement Pertinent to Current Situation/Hospitalization: No - Comment as needed  Activities of Daily Living Home Assistive Devices/Equipment: Dentures (specify type) ADL Screening (condition at time of admission) Patient's cognitive ability adequate to safely complete daily activities?: Yes Is the patient deaf or have difficulty hearing?: No Does the patient have difficulty seeing, even when wearing glasses/contacts?: No Does the patient have difficulty concentrating, remembering, or making decisions?: No Patient able to express need for assistance with ADLs?: Yes Does the patient have difficulty dressing or bathing?: No Independently performs ADLs?: Yes (appropriate for developmental age) Does the patient have difficulty walking or climbing stairs?: No Weakness of Legs: None Weakness of Arms/Hands: None  Permission Sought/Granted Permission sought to share information with : Case Manager, Family Supports Permission granted to share information with : Yes, Verbal Permission Granted  Share Information with NAME: Maudie Mercury  Permission granted to share info w AGENCY: [place] granted to share info w Relationship: daughter     Emotional Assessment Appearance:: Appears stated age Attitude/Demeanor/Rapport: Engaged Affect (typically observed): Accepting Orientation: : Oriented to Self, Oriented to Place, Oriented to  Time, Oriented to Situation Alcohol / Substance Use: Not Applicable Psych Involvement: No (comment)  Admission diagnosis:  Diarrhea of presumed infectious origin [R19.7] Dehydration [E86.0] Hypokalemia [E87.6] Generalized weakness [R53.1] AKI (acute kidney injury) ([place]) [N17.9] Acute renal  failure, unspecified acute renal failure type [facility]) [N17.9] Patient Active Problem List   Diagnosis Date Noted  . AKI (acute kidney injury) ([place]) 11/13/2019   . Hypokalemia 11/13/2019  . Diarrhea 11/13/2019  . LLQ pain 11/13/2019  . Rheumatoid arthritis ([place]) 11/13/2019   PCP:  Maryland Pink, MD Pharmacy:   [id number] [place], [place] - [address] [place] [address] Phone: [phone] Fax: [phone]     Social Determinants of Health (SDOH) Interventions    Readmission Risk Interventions No flowsheet data found.

## 2019-11-17 DIAGNOSIS — K521 Toxic gastroenteritis and colitis: Secondary | ICD-10-CM

## 2019-11-17 LAB — BASIC METABOLIC PANEL
Anion gap: 7 (ref 5–15)
BUN: 10 mg/dL (ref 8–23)
CO2: 20 mmol/L — ABNORMAL LOW (ref 22–32)
Calcium: 7.8 mg/dL — ABNORMAL LOW (ref 8.9–10.3)
Chloride: 113 mmol/L — ABNORMAL HIGH (ref 98–111)
Creatinine, Ser: 1.82 mg/dL — ABNORMAL HIGH (ref 0.44–1.00)
GFR calc Af Amer: 30 mL/min — ABNORMAL LOW (ref >60)
GFR calc non Af Amer: 26 mL/min — ABNORMAL LOW (ref >60)
Glucose, Bld: 91 mg/dL (ref 70–99)
Potassium: 3.8 mmol/L (ref 3.5–5.1)
Sodium: 140 mmol/L (ref 135–145)

## 2019-11-17 LAB — CBC
HCT: 26.1 % — ABNORMAL LOW (ref 36.0–46.0)
Hemoglobin: 8.7 g/dL — ABNORMAL LOW (ref 12.0–15.0)
MCH: 31.6 pg (ref 26.0–34.0)
MCHC: 33.3 g/dL (ref 30.0–36.0)
MCV: 94.9 fL (ref 80.0–100.0)
Platelets: 126 10*3/uL — ABNORMAL LOW (ref 150–400)
RBC: 2.75 MIL/uL — ABNORMAL LOW (ref 3.87–5.11)
RDW: 13.4 % (ref 11.5–15.5)
WBC: 3.6 10*3/uL — ABNORMAL LOW (ref 4.0–10.5)
nRBC: 0 % (ref 0.0–0.2)

## 2019-11-17 LAB — GLUCOSE, CAPILLARY: Glucose-Capillary: 98 mg/dL (ref 70–99)

## 2019-11-17 MED ORDER — VITAMIN B-12 1000 MCG PO TABS: 1000.0000 ug | ORAL_TABLET | Freq: Every day | ORAL | Status: DC

## 2019-11-17 MED ORDER — LOPERAMIDE HCL 2 MG PO CAPS: 4.0000 mg | ORAL_CAPSULE | Freq: Three times a day (TID) | ORAL | 0 refills | Status: DC | PRN

## 2019-11-17 MED ORDER — CHOLESTYRAMINE 4 G PO PACK: 4.0000 g | PACK | Freq: Two times a day (BID) | ORAL | 0 refills | Status: DC

## 2019-11-17 MED ORDER — CYANOCOBALAMIN 1000 MCG PO TABS: 1000.0000 ug | ORAL_TABLET | Freq: Every day | ORAL | 0 refills | Status: DC

## 2019-11-17 MED ORDER — CHOLESTYRAMINE 4 G PO PACK
4.0000 g | PACK | Freq: Two times a day (BID) | ORAL | Status: DC
  Administered 2019-11-17: 4 g via ORAL
  Filled 2019-11-17 (×2): qty 1

## 2019-11-17 NOTE — Discharge Summary (Signed)
[place] [place] NAME: Carol Kent    MR#:  [id number]  DATE OF BIRTH:  Sep 24, 1940  DATE OF ADMISSION:  11/13/2019 ADMITTING PHYSICIAN: Kayleen Memos, DO  DATE OF DISCHARGE: 11/17/2019 12:15 PM  PRIMARY CARE PHYSICIAN: Maryland Pink, MD    ADMISSION DIAGNOSIS:  Diarrhea of presumed infectious origin [R19.7] Dehydration [E86.0] Hypokalemia [E87.6] Generalized weakness [R53.1] AKI (acute kidney injury) ([place]) [N17.9] Acute renal failure, unspecified acute renal failure type ([place]) [N17.9]  DISCHARGE DIAGNOSIS:  Principal Problem:   Acute kidney injury superimposed on CKD ([place]) Active Problems:   Hypokalemia   Diarrhea   LLQ pain   Rheumatoid arthritis ([place])   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 Brain aneurysm   . History of cervical fracture     HOSPITAL COURSE:   1.  Acute on chronic diarrhea.  Stool for C. difficile was negative.  Comprehensive stool panel negative.  Colonoscopy was negative for microscopic colitis.  I started Imodium.  Holding leflunomide which can also cause diarrhea.  I also started cholestyramine. 2.  Acute kidney injury on chronic kidney disease stage IV.  Creatinine 1.82 upon discharge.  Was as high as 2.35 upon presentation. 3.  Hypokalemia.  This was replaced during the hospital course and now in the normal range. 4.  Vitamin B12 deficiency.  I gave IM B12 2 injections while here and switched over to oral upon discharge home.  Can consider IM B12 injections monthly with PCP. 5.  Essential hypertension.  Blood pressure on the lower side I stopped her antihypertensive medications. 6.  Pancytopenia.  Likely secondary to B12 deficiency check counts as outpatient if still low can refer to hematology as outpatient. 7.  Rheumatoid arthritis.  Holding leflunomide.  Follow-up with rheumatology as  outpatient.  DISCHARGE CONDITIONS:   Satisfactory  CONSULTS OBTAINED:  Gastroenterology  DRUG ALLERGIES:   Allergies  Allergen Reactions  . Codeine Nausea Only    Other reaction(s): Nausea  . Erythromycin Nausea Only  . Fentanyl     Other reaction(s): Hallucination  . Morphine And Related     Other reaction(s): Other (See Comments)  . Propoxyphene Nausea Only    Other reaction(s): Nausea  . Sulfa Antibiotics     Other reaction(s): Nausea  . Other Rash    Pain med - can not remember name  . Penicillins Rash    Other reaction(s): Hives    DISCHARGE MEDICATIONS:   Allergies as of 11/17/2019      Reactions   Codeine Nausea Only   Other reaction(s): Nausea   Erythromycin Nausea Only   Fentanyl    Other reaction(s): Hallucination   Morphine And Related    Other reaction(s): Other (See Comments)   Propoxyphene Nausea Only   Other reaction(s): Nausea   Sulfa Antibiotics    Other reaction(s): Nausea   Other Rash   Pain med - can not remember name   Penicillins Rash   Other reaction(s): Hives      Medication List    STOP taking these medications   amLODipine 5 MG tablet Commonly known as: NORVASC   aspirin EC 81 MG tablet   diclofenac Sodium 1 % Gel Commonly known as: VOLTAREN   leflunomide 20 MG tablet Commonly known as: ARAVA   Probiotic Acidophilus Tabs   triamterene-hydrochlorothiazide 37.5-25 MG tablet Commonly known as: [employer]  TAKE these medications   cholestyramine 4 g packet Commonly known as: QUESTRAN Take 1 packet (4 g total) by mouth 2 (two) times daily.   cyanocobalamin 1000 MCG tablet Take 1 tablet (1,000 mcg total) by mouth daily. Start taking on: November 18, 2019   loperamide 2 MG capsule Commonly known as: IMODIUM Take 2 capsules (4 mg total) by mouth every 8 (eight) hours as needed for diarrhea or loose stools.        DISCHARGE INSTRUCTIONS:   Follow-up PCP 5 days Follow-up with Dr. Vicente Males in a few weeks  If you  experience worsening of your admission symptoms, develop shortness of breath, life threatening emergency, suicidal or homicidal thoughts you must seek medical attention immediately by calling 911 or calling your MD immediately  if symptoms less severe.  You Must read complete instructions/literature along with all the possible adverse reactions/side effects for all the Medicines you take and that have been prescribed to you. Take any new Medicines after you have completely understood and accept all the possible adverse reactions/side effects.   Please note  You were cared for by a hospitalist during your hospital stay. If you have any questions about your discharge medications or the care you received while you were in the hospital after you are discharged, you can call the unit and asked to speak with the hospitalist on call if the hospitalist that took care of you is not available. Once you are discharged, your primary care physician will handle any further medical issues. Please note that NO REFILLS for any discharge medications will be authorized once you are discharged, as it is imperative that you return to your primary care physician (or establish a relationship with a primary care physician if you do not have one) for your aftercare needs so that they can reassess your need for medications and monitor your lab values.    Today   CHIEF COMPLAINT:   Chief Complaint  Patient presents with  . Diarrhea  . Weakness    HISTORY OF PRESENT ILLNESS:  Carol Kent  is a 79 y.o. female came in with diarrhea and weakness   VITAL SIGNS:  Blood pressure 106/66, pulse 79, temperature 98.2 F (36.8 C), temperature source Oral, resp. rate 18, height 5\' 2"  (1.575 m), weight 54.4 kg, SpO2 97 %.  I/O:    Intake/Output Summary (Last 24 hours) at 11/17/2019 1741 Last data filed at 11/17/2019 1003 Gross per 24 hour  Intake 360 ml  Output --  Net 360 ml    PHYSICAL EXAMINATION:  GENERAL:  79  y.o.-year-old patient lying in the bed with no acute distress.  EYES: Pupils equal, round, reactive to light and accommodation. No scleral icterus. HEENT: Head atraumatic, normocephalic. Oropharynx and nasopharynx clear.  LUNGS: Normal breath sounds bilaterally, no wheezing, rales,rhonchi or crepitation. No use of accessory muscles of respiration.  CARDIOVASCULAR: S1, S2 normal. No murmurs, rubs, or gallops.  ABDOMEN: Soft, non-tender, non-distended. Bowel sounds present. EXTREMITIES: No pedal edema.  NEUROLOGIC: Cranial nerves II through XII are intact. Muscle strength 5/5 in all extremities. Sensation intact. Gait not checked.  PSYCHIATRIC: The patient is alert and oriented x 3.  SKIN: No obvious rash, lesion, or ulcer.   DATA REVIEW:   CBC Recent Labs  Lab 11/17/19 0417  WBC 3.6*  HGB 8.7*  HCT 26.1*  PLT 126*    Chemistries  Recent Labs  Lab 11/12/19 1557 11/13/19 0906 11/14/19 1107 11/15/19 0537 11/17/19 0417  NA 133*   < >  139   < > 140  K 2.8*   < > 4.0   < > 3.8  CL 97*   < > 108   < > 113*  CO2 24   < > 21*   < > 20*  GLUCOSE 165*   < > 97   < > 91  BUN 17   < > 14   < > 10  CREATININE 2.35*   < > 1.83*   < > 1.82*  CALCIUM 8.1*   < > 8.4*   < > 7.8*  MG 1.8  --  1.8  --   --   AST 30  --   --   --   --   ALT 19  --   --   --   --   ALKPHOS 128*  --   --   --   --   BILITOT 1.5*  --   --   --   --    < > = values in this interval not displayed.    Microbiology Results  Results for orders placed or performed during the hospital encounter of 11/13/19  C Difficile Quick Screen w PCR reflex     Status: None   Collection Time: 11/13/19  2:13 AM   Specimen: STOOL  Result Value Ref Range Status   C Diff antigen NEGATIVE NEGATIVE Final   C Diff toxin NEGATIVE NEGATIVE Final   C Diff interpretation No C. difficile detected.  Final    Comment: Performed at [facility], [name] [place], [place] [address]  Gastrointestinal Panel by PCR , Stool      Status: None   Collection Time: 11/13/19  2:13 AM   Specimen: STOOL  Result Value Ref Range Status   Campylobacter species NOT DETECTED NOT DETECTED Final   Plesimonas shigelloides NOT DETECTED NOT DETECTED Final   Salmonella species NOT DETECTED NOT DETECTED Final   Yersinia enterocolitica NOT DETECTED NOT DETECTED Final   Vibrio species NOT DETECTED NOT DETECTED Final   Vibrio cholerae NOT DETECTED NOT DETECTED Final   Enteroaggregative E coli (EAEC) NOT DETECTED NOT DETECTED Final   Enteropathogenic E coli (EPEC) NOT DETECTED NOT DETECTED Final   Enterotoxigenic E coli (ETEC) NOT DETECTED NOT DETECTED Final   Shiga like toxin producing E coli (STEC) NOT DETECTED NOT DETECTED Final   Shigella/Enteroinvasive E coli (EIEC) NOT DETECTED NOT DETECTED Final   Cryptosporidium NOT DETECTED NOT DETECTED Final   Cyclospora cayetanensis NOT DETECTED NOT DETECTED Final   Entamoeba histolytica NOT DETECTED NOT DETECTED Final   Giardia lamblia NOT DETECTED NOT DETECTED Final   Adenovirus F40/41 NOT DETECTED NOT DETECTED Final   Astrovirus NOT DETECTED NOT DETECTED Final   Norovirus GI/GII NOT DETECTED NOT DETECTED Final   Rotavirus A NOT DETECTED NOT DETECTED Final   Sapovirus (I, II, IV, and V) NOT DETECTED NOT DETECTED Final    Comment: Performed at [facility], [address] [place], [place] [address]  SARS Coronavirus 2 by RT PCR (hospital order, performed in [place] hospital lab) Nasopharyngeal Nasopharyngeal Swab     Status: None   Collection Time: 11/13/19  2:13 AM   Specimen: Nasopharyngeal Swab  Result Value Ref Range Status   SARS Coronavirus 2 NEGATIVE NEGATIVE Final    Comment: (NOTE) SARS-CoV-2 target nucleic acids are NOT DETECTED.  The SARS-CoV-2 RNA is generally detectable in upper and lower respiratory specimens during the acute phase of infection. The  lowest concentration of SARS-CoV-2 viral copies this assay can detect is 250 copies / mL. A negative  result does not preclude SARS-CoV-2 infection and should not be used as the sole basis for treatment or other patient management decisions.  A negative result may occur with improper specimen collection / handling, submission of specimen other than nasopharyngeal swab, presence of viral mutation(s) within the areas targeted by this assay, and inadequate number of viral copies (<250 copies / mL). A negative result must be combined with clinical observations, patient history, and epidemiological information.  Fact Sheet for Patients:   StrictlyIdeas.no  Fact Sheet for Healthcare Providers: BankingDealers.co.za  This test is not yet approved or  cleared by the [place] FDA and has been authorized for detection and/or diagnosis of SARS-CoV-2 by FDA under an Emergency Use Authorization (EUA).  This EUA will remain in effect (meaning this test can be used) for the duration of the COVID-19 declaration under Section 564(b)(1) of the Act, 21 U.S.C. section 360bbb-3(b)(1), unless the authorization is terminated or revoked sooner.  Performed at [facility], [address] [place], [place] [address]      Management plans discussed with the patient, family and they are in agreement.  CODE STATUS:  Code Status History    Date Active Date Inactive Code Status Order ID Comments User Context   11/13/2019 0121 11/17/2019 1727 Full Code [id number]  Athena Masse, MD ED   [facility] Care Planning Activity    Questions for Most Recent Historical Code Status (Order [id number])       TOTAL TIME TAKING CARE OF THIS PATIENT: 35 minutes.    Loletha Grayer M.D on 11/17/2019 at 5:41 PM  Between 7am to 6pm - Pager - [phone]  After 6pm go to [email or web address] - password [email or web address]  [facility]  CC: Primary care physician; Maryland Pink, MD

## 2019-11-17 NOTE — Plan of Care (Signed)

## 2019-11-17 NOTE — TOC Transition Note (Signed)
Transition of Care [facility]) - CM/SW Discharge Note   Patient Details  Name: Carol Kent MRN: [id number] Date of Birth: 12-18-1940  [facility] CM/SW Contact:  Shelbie Hutching, RN Phone Number: 11/17/2019, 11:20 AM   Clinical Narrative:     Patient medically cleared for discharge home with home health services.  Floydene Flock with [facility] is aware of discharge today and home health orders are in.  [name] will be picking her up today.   Final next level of care: [place] Barriers to Discharge: Barriers Resolved   Patient Goals and CMS Choice Patient states their goals for this hospitalization and ongoing recovery are:: Patient wants to get back home CMS Medicare.gov Compare Post Acute Care list provided to:: Patient Choice offered to / list presented to : Patient  Discharge Placement                       Discharge Plan and Services   Discharge Planning Services: CM Consult Post Acute Care Choice: Home Health                    HH Arranged: PT, OT [facility] Agency: [place] ([facility]) Date [facility] Agency Contacted: 11/17/19 Time HH Agency Contacted: 1120 Representative spoke with at [place]: [place] ([facility]) Interventions     Readmission Risk Interventions No flowsheet data found.

## 2019-11-17 NOTE — Progress Notes (Signed)
Carol Kent , MD [address] [place], [place], [place], [address] [address], [place], [place], [place], [address] Phone: [phone]  Fax: [phone]   Carol Kent is being followed for diarrhea    Subjective:   Feels better, decreased freq of bowel movements and now consistency "soft "    Objective: Vital signs in last 24 hours: Vitals:   11/16/19 1946 11/17/19 0028 11/17/19 0439 11/17/19 0848  BP: (!) 98/47 (!) 103/50 (!) 106/49 106/66  Pulse: 77 83 81 79  Resp: 17 17 17 18   Temp: 98.2 F (36.8 C) 98.3 F (36.8 C)  98.2 F (36.8 C)  TempSrc: Oral Oral  Oral  SpO2: 98% 95% 94% 97%  Weight:      Height:       Weight change:   Intake/Output Summary (Last 24 hours) at 11/17/2019 1012 Last data filed at 11/17/2019 1003 Gross per 24 hour  Intake 360 ml  Output --  Net 360 ml     Exam:  Abdomen: soft, nontender, normal bowel sounds   Lab Results: @LABTEST2 @ Micro Results: Recent Results (from the past 240 hour(s))  C Difficile Quick Screen w PCR reflex     Status: None   Collection Time: 11/13/19  2:13 AM   Specimen: STOOL  Result Value Ref Range Status   C Diff antigen NEGATIVE NEGATIVE Final   C Diff toxin NEGATIVE NEGATIVE Final   C Diff interpretation No C. difficile detected.  Final    Comment: Performed at [facility], [address] [place], [place] [address]  Gastrointestinal Panel by PCR , Stool     Status: None   Collection Time: 11/13/19  2:13 AM   Specimen: STOOL  Result Value Ref Range Status   Campylobacter species NOT DETECTED NOT DETECTED Final   Plesimonas shigelloides NOT DETECTED NOT DETECTED Final   Salmonella species NOT DETECTED NOT DETECTED Final   Yersinia enterocolitica NOT DETECTED NOT DETECTED Final   Vibrio species NOT DETECTED NOT DETECTED Final   Vibrio cholerae NOT DETECTED NOT DETECTED Final   Enteroaggregative E coli (EAEC) NOT DETECTED NOT DETECTED Final   Enteropathogenic E coli (EPEC) NOT  DETECTED NOT DETECTED Final   Enterotoxigenic E coli (ETEC) NOT DETECTED NOT DETECTED Final   Shiga like toxin producing E coli (STEC) NOT DETECTED NOT DETECTED Final   Shigella/Enteroinvasive E coli (EIEC) NOT DETECTED NOT DETECTED Final   Cryptosporidium NOT DETECTED NOT DETECTED Final   Cyclospora cayetanensis NOT DETECTED NOT DETECTED Final   Entamoeba histolytica NOT DETECTED NOT DETECTED Final   Giardia lamblia NOT DETECTED NOT DETECTED Final   Adenovirus F40/41 NOT DETECTED NOT DETECTED Final   Astrovirus NOT DETECTED NOT DETECTED Final   Norovirus GI/GII NOT DETECTED NOT DETECTED Final   Rotavirus A NOT DETECTED NOT DETECTED Final   Sapovirus (I, II, IV, and V) NOT DETECTED NOT DETECTED Final    Comment: Performed at [facility], [address] [place], [place] [address]  SARS Coronavirus 2 by RT PCR (hospital order, performed in [place] hospital lab) Nasopharyngeal Nasopharyngeal Swab     Status: None   Collection Time: 11/13/19  2:13 AM   Specimen: Nasopharyngeal Swab  Result Value Ref Range Status   SARS Coronavirus 2 NEGATIVE NEGATIVE Final    Comment: (NOTE) SARS-CoV-2 target nucleic acids are NOT DETECTED.  The SARS-CoV-2 RNA is generally detectable in upper and lower respiratory specimens during the acute phase of infection. The lowest concentration  of SARS-CoV-2 viral copies this assay can detect is 250 copies / mL. A negative result does not preclude SARS-CoV-2 infection and should not be used as the sole basis for treatment or other patient management decisions.  A negative result may occur with improper specimen collection / handling, submission of specimen other than nasopharyngeal swab, presence of viral mutation(s) within the areas targeted by this assay, and inadequate number of viral copies (<250 copies / mL). A negative result must be combined with clinical observations, patient history, and epidemiological information.  Fact Sheet for  Patients:   StrictlyIdeas.no  Fact Sheet for Healthcare Providers: BankingDealers.co.za  This test is not yet approved or  cleared by the [place] FDA and has been authorized for detection and/or diagnosis of SARS-CoV-2 by FDA under an Emergency Use Authorization (EUA).  This EUA will remain in effect (meaning this test can be used) for the duration of the COVID-19 declaration under Section 564(b)(1) of the Act, 21 U.S.C. section 360bbb-3(b)(1), unless the authorization is terminated or revoked sooner.  Performed at [facility], [address], [place], [place] [address]    Studies/Results: No results found. Medications: I have reviewed the patient's current medications. Scheduled Meds: . cholestyramine  4 g Oral BID  . cyanocobalamin  1,000 mcg Intramuscular Daily  . heparin injection (subcutaneous)  5,000 Units Subcutaneous Q8H  . [START ON 11/18/2019] vitamin B-12  1,000 mcg Oral Daily   Continuous Infusions: PRN Meds:.acetaminophen **OR** acetaminophen, loperamide, ondansetron **OR** ondansetron (ZOFRAN) IV   Assessment: Principal Problem:   Acute kidney injury superimposed on CKD (HCC) Active Problems:   Hypokalemia   Diarrhea   LLQ pain   Rheumatoid arthritis ([place])   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   Pancytopenia (HCC)  Carol Kent 79 y.o. female being followed for chronic diarrhea- severe,  Stool studies negative, colonoscopy and biopsies were also surprisingly normal , on cholestyramine.   Plan: 1.  Continue to hold leflunomide. She is improving - can add imodium if needed  2. Suggest outpatient GI follow up with her Primary Gastroenterologist   I will sign off.  Please call me if any further GI concerns or questions.  We would like to thank you for the opportunity to participate in the care of Carol Kent.     LOS: 4 days   Carol Bellows, MD 11/17/2019, 10:12 AM

## 2019-11-17 NOTE — Progress Notes (Signed)
Pt discharged off floor via wheelchair accompanied by transport, wearing own clothes, has all belongings and daughter transporting pt home

## 2019-11-17 NOTE — Discharge Instructions (Signed)
Chronic Diarrhea Chronic diarrhea is a condition in which a person passes frequent loose and watery stools for 4 weeks or longer. Non-chronic diarrhea usually lasts for only 2-3 days. Diarrhea can cause a person to feel weak and dehydrated. Dehydration can make the person tired and thirsty. It can also cause a dry mouth, decreased urination, and dark yellow urine. Diarrhea is a sign of an underlying problem, such as:  Infection.  Side effects of medicines.  Problems digesting something in your diet, such as milk products if you have lactose intolerance.  Conditions such as celiac disease, irritable bowel syndrome (IBS), or inflammatory bowel disease (IBD). If you have chronic diarrhea, make sure you treat it as told by your health care provider. Follow these instructions at home: Medicines  Take over-the-counter and prescription medicines only as told by your health care provider.  If you were prescribed an antibiotic medicine, take it as told by your health care provider. Do not stop taking the antibiotic even if you start to feel better. Eating and drinking   Follow instructions from your health care provider about what to eat and drink. You may have to: ? Avoid foods that trigger diarrhea for you. ? Take an oral rehydration solution (ORS). This is a drink that keeps you hydrated. It can be found at pharmacies and retail stores. ? Drink clear fluids, such as water, diluted fruit juice, and low-calorie sports drinks. You can also get fluids by sucking on ice chips. ? Drink enough fluid to keep your urine pale yellow. This will help you avoid dehydration. ? Eat small amounts of bland foods that are easy to digest as you are able. These foods include bananas, applesauce, rice, lean meats, toast, and crackers. ? Avoid spicy or fatty foods. ? Avoid foods and beverages that contain a lot of sugar or caffeine.  Do not drink alcohol if: ? Your health care provider tells you not to  drink. ? You are pregnant, may be pregnant, or are planning to become pregnant.  If you drink alcohol: ? Limit how much you use to:  0-1 drink a day for women.  0-2 drinks a day for men. ? Be aware of how much alcohol is in your drink. In the [place] one drink equals one 12 oz bottle of beer (355 mL), one 5 oz glass of wine (148 mL), or one 1 oz glass of hard liquor (44 mL). General instructions   Wash your hands often and after each diarrhea episode. Use soap and water. If soap and water are not available, use hand sanitizer.  Make sure that all people in your household wash their hands well and often.  Rest as told by your health care provider.  Watch your condition for any changes.  Take a warm bath to relieve any burning or pain from frequent diarrhea episodes.  Keep all follow-up visits as told by your health care provider. This is important. Contact a health care provider if:  You have a fever.  Your diarrhea gets worse or does not get better.  You have new symptoms.  You cannot drink fluid without vomiting.  You feel light-headed or dizzy.  You have a headache.  You have muscle cramps.  You have severe pain in the rectum. Get help right away if:  You have vomiting that does not go away.  You have chest pain.  You feel very weak or you faint.  You have bloody or black stools, or stools that look like  tar.  You have severe pain, cramping, or bloating in your abdomen, or pain that stays in one place.  You have trouble breathing or you are breathing very quickly.  Your heart is beating very quickly.  Your skin feels cold and clammy.  You feel confused.  You have a severe headache.  You have signs or symptoms of dehydration, such as: ? Dark urine, very little urine, or no urine. ? Cracked lips. ? Dry mouth. ? Sunken eyes. ? Sleepiness. ? Weakness. These symptoms may represent a serious problem that is an emergency. Do not wait to see if the  symptoms will go away. Get medical help right away. Call your local emergency services (911 in the [place] Do not drive yourself to the hospital. Summary  Chronic diarrhea is a condition in which a person passes frequent loose and watery stools for 4 weeks or longer.  Diarrhea is a sign of an underlying problem.  Make sure you treat your diarrhea as told by your health care provider.  Drink enough fluid to keep your urine pale yellow. This will help you avoid dehydration.  Wash your hands often and after each diarrhea episode. If soap and water are not available, use hand sanitizer. This information is not intended to replace advice given to you by your health care provider. Make sure you discuss any questions you have with your health care provider. Document Revised: 09/13/2018 Document Reviewed: 09/13/2018 Elsevier Patient Education  [place].   Dehydration, Adult Dehydration is condition in which there is not enough water or other fluids in the body. This happens when a person loses more fluids than he or she takes in. Important body parts cannot work right without the right amount of fluids. Any loss of fluids from the body can cause dehydration. Dehydration can be mild, worse, or very bad. It should be treated right away to keep it from getting very bad. What are the causes? This condition may be caused by:  Conditions that cause loss of water or other fluids, such as: ? Watery poop (diarrhea). ? Vomiting. ? Sweating a lot. ? Peeing (urinating) a lot.  Not drinking enough fluids, especially when you: ? Are ill. ? Are doing things that take a lot of energy to do.  Other illnesses and conditions, such as fever or infection.  Certain medicines, such as medicines that take extra fluid out of the body (diuretics).  Lack of safe drinking water.  Not being able to get enough water and food. What increases the risk? The following factors may make you more likely to  develop this condition:  Having a long-term (chronic) illness that has not been treated the right way, such as: ? Diabetes. ? Heart disease. ? Kidney disease.  Being 4 years of age or older.  Having a disability.  Living in a place that is high above the ground or sea (high in altitude). The thinner, dried air causes more fluid loss.  Doing exercises that put stress on your body for a long time. What are the signs or symptoms? Symptoms of dehydration depend on how bad it is. Mild or worse dehydration  Thirst.  Dry lips or dry mouth.  Feeling dizzy or light-headed, especially when you stand up from sitting.  Muscle cramps.  Your body making: ? Dark pee (urine). Pee may be the color of tea. ? Less pee than normal. ? Less tears than normal.  Headache. Very bad dehydration  Changes in skin. Skin may: ?  Be cold to the touch (clammy). ? Be blotchy or pale. ? Not go back to normal right after you lightly pinch it and let it go.  Little or no tears, pee, or sweat.  Changes in vital signs, such as: ? Fast breathing. ? Low blood pressure. ? Weak pulse. ? Pulse that is more than 100 beats a minute when you are sitting still.  Other changes, such as: ? Feeling very thirsty. ? Eyes that look hollow (sunken). ? Cold hands and feet. ? Being mixed up (confused). ? Being very tired (lethargic) or having trouble waking from sleep. ? Short-term weight loss. ? Loss of consciousness. How is this treated? Treatment for this condition depends on how bad it is. Treatment should start right away. Do not wait until your condition gets very bad. Very bad dehydration is an emergency. You will need to go to a hospital.  Mild or worse dehydration can be treated at home. You may be asked to: ? Drink more fluids. ? Drink an oral rehydration solution (ORS). This drink helps get the right amounts of fluids and salts and minerals in the blood (electrolytes).  Very bad dehydration can be  treated: ? With fluids through an IV tube. ? By getting normal levels of salts and minerals in your blood. This is often done by giving salts and minerals through a tube. The tube is passed through your nose and into your stomach. ? By treating the root cause. Follow these instructions at home: Oral rehydration solution If told by your doctor, drink an ORS:  Make an ORS. Use instructions on the package.  Start by drinking small amounts, about  cup (120 mL) every 5-10 minutes.  Slowly drink more until you have had the amount that your doctor said to have. Eating and drinking         Drink enough clear fluid to keep your pee pale yellow. If you were told to drink an ORS, finish the ORS first. Then, start slowly drinking other clear fluids. Drink fluids such as: ? Water. Do not drink only water. Doing that can make the salt (sodium) level in your body get too low. ? Water from ice chips you suck on. ? Fruit juice that you have added water to (diluted). ? Low-calorie sports drinks.  Eat foods that have the right amounts of salts and minerals, such as: ? Bananas. ? Oranges. ? Potatoes. ? Tomatoes. ? Spinach.  Do not drink alcohol.  Avoid: ? Drinks that have a lot of sugar. These include:  High-calorie sports drinks.  Fruit juice that you did not add water to.  Soda.  Caffeine. ? Foods that are greasy or have a lot of fat or sugar. General instructions  Take over-the-counter and prescription medicines only as told by your doctor.  Do not take salt tablets. Doing that can make the salt level in your body get too high.  Return to your normal activities as told by your doctor. Ask your doctor what activities are safe for you.  Keep all follow-up visits as told by your doctor. This is important. Contact a doctor if:  You have pain in your belly (abdomen) and the pain: ? Gets worse. ? Stays in one place.  You have a rash.  You have a stiff neck.  You get angry  or annoyed (irritable) more easily than normal.  You are more tired or have a harder time waking than normal.  You feel: ? Weak or dizzy. ? Very  thirsty. Get help right away if you have:  Any symptoms of very bad dehydration.  Symptoms of vomiting, such as: ? You cannot eat or drink without vomiting. ? Your vomiting gets worse or does not go away. ? Your vomit has blood or green stuff in it.  Symptoms that get worse with treatment.  A fever.  A very bad headache.  Problems with peeing or pooping (having a bowel movement), such as: ? Watery poop that gets worse or does not go away. ? Blood in your poop (stool). This may cause poop to look black and tarry. ? Not peeing in 6-8 hours. ? Peeing only a small amount of very dark pee in 6-8 hours.  Trouble breathing. These symptoms may be an emergency. Do not wait to see if the symptoms will go away. Get medical help right away. Call your local emergency services (911 in the [place] Do not drive yourself to the hospital. Summary  Dehydration is a condition in which there is not enough water or other fluids in the body. This happens when a person loses more fluids than he or she takes in.  Treatment for this condition depends on how bad it is. Treatment should be started right away. Do not wait until your condition gets very bad.  Drink enough clear fluid to keep your pee pale yellow. If you were told to drink an oral rehydration solution (ORS), finish the ORS first. Then, start slowly drinking other clear fluids.  Take over-the-counter and prescription medicines only as told by your doctor.  Get help right away if you have any symptoms of very bad dehydration. This information is not intended to replace advice given to you by your health care provider. Make sure you discuss any questions you have with your health care provider. Document Revised: 10/28/2018 Document Reviewed: 10/28/2018 Elsevier Patient Education  [place].   Chronic Diarrhea Chronic diarrhea is a condition in which a person passes frequent loose and watery stools for 4 weeks or longer. Non-chronic diarrhea usually lasts for only 2-3 days. Diarrhea can cause a person to feel weak and dehydrated. Dehydration can make the person tired and thirsty. It can also cause a dry mouth, decreased urination, and dark yellow urine. Diarrhea is a sign of an underlying problem, such as:  Infection.  Side effects of medicines.  Problems digesting something in your diet, such as milk products if you have lactose intolerance.  Conditions such as celiac disease, irritable bowel syndrome (IBS), or inflammatory bowel disease (IBD). If you have chronic diarrhea, make sure you treat it as told by your health care provider. Follow these instructions at home: Medicines  Take over-the-counter and prescription medicines only as told by your health care provider.  If you were prescribed an antibiotic medicine, take it as told by your health care provider. Do not stop taking the antibiotic even if you start to feel better. Eating and drinking   Follow instructions from your health care provider about what to eat and drink. You may have to: ? Avoid foods that trigger diarrhea for you. ? Take an oral rehydration solution (ORS). This is a drink that keeps you hydrated. It can be found at pharmacies and retail stores. ? Drink clear fluids, such as water, diluted fruit juice, and low-calorie sports drinks. You can also get fluids by sucking on ice chips. ? Drink enough fluid to keep your urine pale yellow. This will help you avoid dehydration. ? Eat small amounts of  bland foods that are easy to digest as you are able. These foods include bananas, applesauce, rice, lean meats, toast, and crackers. ? Avoid spicy or fatty foods. ? Avoid foods and beverages that contain a lot of sugar or caffeine.  Do not drink alcohol if: ? Your health care provider tells you not to  drink. ? You are pregnant, may be pregnant, or are planning to become pregnant.  If you drink alcohol: ? Limit how much you use to:  0-1 drink a day for women.  0-2 drinks a day for men. ? Be aware of how much alcohol is in your drink. In the [place] one drink equals one 12 oz bottle of beer (355 mL), one 5 oz glass of wine (148 mL), or one 1 oz glass of hard liquor (44 mL). General instructions   Wash your hands often and after each diarrhea episode. Use soap and water. If soap and water are not available, use hand sanitizer.  Make sure that all people in your household wash their hands well and often.  Rest as told by your health care provider.  Watch your condition for any changes.  Take a warm bath to relieve any burning or pain from frequent diarrhea episodes.  Keep all follow-up visits as told by your health care provider. This is important. Contact a health care provider if:  You have a fever.  Your diarrhea gets worse or does not get better.  You have new symptoms.  You cannot drink fluid without vomiting.  You feel light-headed or dizzy.  You have a headache.  You have muscle cramps.  You have severe pain in the rectum. Get help right away if:  You have vomiting that does not go away.  You have chest pain.  You feel very weak or you faint.  You have bloody or black stools, or stools that look like tar.  You have severe pain, cramping, or bloating in your abdomen, or pain that stays in one place.  You have trouble breathing or you are breathing very quickly.  Your heart is beating very quickly.  Your skin feels cold and clammy.  You feel confused.  You have a severe headache.  You have signs or symptoms of dehydration, such as: ? Dark urine, very little urine, or no urine. ? Cracked lips. ? Dry mouth. ? Sunken eyes. ? Sleepiness. ? Weakness. These symptoms may represent a serious problem that is an emergency. Do not wait to see if the  symptoms will go away. Get medical help right away. Call your local emergency services (911 in the [place] Do not drive yourself to the hospital. Summary  Chronic diarrhea is a condition in which a person passes frequent loose and watery stools for 4 weeks or longer.  Diarrhea is a sign of an underlying problem.  Make sure you treat your diarrhea as told by your health care provider.  Drink enough fluid to keep your urine pale yellow. This will help you avoid dehydration.  Wash your hands often and after each diarrhea episode. If soap and water are not available, use hand sanitizer. This information is not intended to replace advice given to you by your health care provider. Make sure you discuss any questions you have with your health care provider. Document Revised: 09/13/2018 Document Reviewed: 09/13/2018 Elsevier Patient Education  [place]. Chronic Diarrhea Chronic diarrhea is a condition in which a person passes frequent loose and watery stools for 4 weeks or longer. Non-chronic  diarrhea usually lasts for only 2-3 days. Diarrhea can cause a person to feel weak and dehydrated. Dehydration can make the person tired and thirsty. It can also cause a dry mouth, decreased urination, and dark yellow urine. Diarrhea is a sign of an underlying problem, such as:  Infection.  Side effects of medicines.  Problems digesting something in your diet, such as milk products if you have lactose intolerance.  Conditions such as celiac disease, irritable bowel syndrome (IBS), or inflammatory bowel disease (IBD). If you have chronic diarrhea, make sure you treat it as told by your health care provider. Follow these instructions at home: Medicines  Take over-the-counter and prescription medicines only as told by your health care provider.  If you were prescribed an antibiotic medicine, take it as told by your health care provider. Do not stop taking the antibiotic even if you start to feel  better. Eating and drinking   Follow instructions from your health care provider about what to eat and drink. You may have to: ? Avoid foods that trigger diarrhea for you. ? Take an oral rehydration solution (ORS). This is a drink that keeps you hydrated. It can be found at pharmacies and retail stores. ? Drink clear fluids, such as water, diluted fruit juice, and low-calorie sports drinks. You can also get fluids by sucking on ice chips. ? Drink enough fluid to keep your urine pale yellow. This will help you avoid dehydration. ? Eat small amounts of bland foods that are easy to digest as you are able. These foods include bananas, applesauce, rice, lean meats, toast, and crackers. ? Avoid spicy or fatty foods. ? Avoid foods and beverages that contain a lot of sugar or caffeine.  Do not drink alcohol if: ? Your health care provider tells you not to drink. ? You are pregnant, may be pregnant, or are planning to become pregnant.  If you drink alcohol: ? Limit how much you use to:  0-1 drink a day for women.  0-2 drinks a day for men. ? Be aware of how much alcohol is in your drink. In the [place] one drink equals one 12 oz bottle of beer (355 mL), one 5 oz glass of wine (148 mL), or one 1 oz glass of hard liquor (44 mL). General instructions   Wash your hands often and after each diarrhea episode. Use soap and water. If soap and water are not available, use hand sanitizer.  Make sure that all people in your household wash their hands well and often.  Rest as told by your health care provider.  Watch your condition for any changes.  Take a warm bath to relieve any burning or pain from frequent diarrhea episodes.  Keep all follow-up visits as told by your health care provider. This is important. Contact a health care provider if:  You have a fever.  Your diarrhea gets worse or does not get better.  You have new symptoms.  You cannot drink fluid without vomiting.  You feel  light-headed or dizzy.  You have a headache.  You have muscle cramps.  You have severe pain in the rectum. Get help right away if:  You have vomiting that does not go away.  You have chest pain.  You feel very weak or you faint.  You have bloody or black stools, or stools that look like tar.  You have severe pain, cramping, or bloating in your abdomen, or pain that stays in one place.  You have trouble  breathing or you are breathing very quickly.  Your heart is beating very quickly.  Your skin feels cold and clammy.  You feel confused.  You have a severe headache.  You have signs or symptoms of dehydration, such as: ? Dark urine, very little urine, or no urine. ? Cracked lips. ? Dry mouth. ? Sunken eyes. ? Sleepiness. ? Weakness. These symptoms may represent a serious problem that is an emergency. Do not wait to see if the symptoms will go away. Get medical help right away. Call your local emergency services (911 in the [place] Do not drive yourself to the hospital. Summary  Chronic diarrhea is a condition in which a person passes frequent loose and watery stools for 4 weeks or longer.  Diarrhea is a sign of an underlying problem.  Make sure you treat your diarrhea as told by your health care provider.  Drink enough fluid to keep your urine pale yellow. This will help you avoid dehydration.  Wash your hands often and after each diarrhea episode. If soap and water are not available, use hand sanitizer. This information is not intended to replace advice given to you by your health care provider. Make sure you discuss any questions you have with your health care provider. Document Revised: 09/13/2018 Document Reviewed: 09/13/2018 Elsevier Patient Education  [place].

## 2019-11-18 LAB — CELIAC DISEASE PANEL
Endomysial Ab, IgA: NEGATIVE
IgA: 111 mg/dL (ref 64–422)
Tissue Transglutaminase Ab, IgA: <2 U/mL (ref 0–3)

## 2019-11-21 ENCOUNTER — Encounter: Payer: Self-pay | Admitting: Gastroenterology

## 2019-11-28 ENCOUNTER — Telehealth: Payer: Self-pay | Admitting: Gastroenterology

## 2019-11-28 NOTE — Telephone Encounter (Signed)
FYI [name] calling to sch new pt visit with Dr. Vicente Males since pt saw him in the hosp on 8.16.21. Dr. Georgeann Oppenheim first new pt appt is Nov, so pt will keep f/u appt with Dr. Alice Reichert for 9.23.21.

## 2020-06-07 ENCOUNTER — Emergency Department
Admission: EM | Admit: 2020-06-07 | Discharge: 2020-06-07 | Disposition: A | Discharge status: Home / Self Care | Payer: Medicare Other | Attending: Emergency Medicine | Admitting: Emergency Medicine

## 2020-06-07 ENCOUNTER — Other Ambulatory Visit: Payer: Self-pay

## 2020-06-07 ENCOUNTER — Encounter: Payer: Self-pay | Admitting: Emergency Medicine

## 2020-06-07 ENCOUNTER — Emergency Department: Payer: Medicare Other

## 2020-06-07 DIAGNOSIS — T886XXA Anaphylactic reaction due to adverse effect of correct drug or medicament properly administered, initial encounter: Secondary | ICD-10-CM | POA: Diagnosis not present

## 2020-06-07 DIAGNOSIS — R0602 Shortness of breath: Secondary | ICD-10-CM | POA: Diagnosis present

## 2020-06-07 DIAGNOSIS — R110 Nausea: Secondary | ICD-10-CM | POA: Diagnosis not present

## 2020-06-07 DIAGNOSIS — T782XXA Anaphylactic shock, unspecified, initial encounter: Secondary | ICD-10-CM

## 2020-06-07 LAB — CBC WITH DIFFERENTIAL/PLATELET
Abs Immature Granulocytes: 0.16 10*3/uL — ABNORMAL HIGH (ref 0.00–0.07)
Basophils Absolute: 0 10*3/uL (ref 0.0–0.1)
Basophils Relative: 0 %
Eosinophils Absolute: 0 10*3/uL (ref 0.0–0.5)
Eosinophils Relative: 0 %
HCT: 36.9 % (ref 36.0–46.0)
Hemoglobin: 12.4 g/dL (ref 12.0–15.0)
Immature Granulocytes: 2 %
Lymphocytes Relative: 24 %
Lymphs Abs: 2.5 10*3/uL (ref 0.7–4.0)
MCH: 30.6 pg (ref 26.0–34.0)
MCHC: 33.6 g/dL (ref 30.0–36.0)
MCV: 91.1 fL (ref 80.0–100.0)
Monocytes Absolute: 0.5 10*3/uL (ref 0.1–1.0)
Monocytes Relative: 5 %
Neutro Abs: 7.1 10*3/uL (ref 1.7–7.7)
Neutrophils Relative %: 69 %
Platelets: 311 10*3/uL (ref 150–400)
RBC: 4.05 MIL/uL (ref 3.87–5.11)
RDW: 13.5 % (ref 11.5–15.5)
WBC: 10.3 10*3/uL (ref 4.0–10.5)
nRBC: 0 % (ref 0.0–0.2)

## 2020-06-07 LAB — TROPONIN I (HIGH SENSITIVITY)
Troponin I (High Sensitivity): 10 ng/L (ref <18)
Troponin I (High Sensitivity): 8 ng/L (ref <18)

## 2020-06-07 LAB — BASIC METABOLIC PANEL
Anion gap: 12 (ref 5–15)
BUN: 34 mg/dL — ABNORMAL HIGH (ref 8–23)
CO2: 19 mmol/L — ABNORMAL LOW (ref 22–32)
Calcium: 8.8 mg/dL — ABNORMAL LOW (ref 8.9–10.3)
Chloride: 106 mmol/L (ref 98–111)
Creatinine, Ser: 1.8 mg/dL — ABNORMAL HIGH (ref 0.44–1.00)
GFR, Estimated: 28 mL/min — ABNORMAL LOW (ref >60)
Glucose, Bld: 210 mg/dL — ABNORMAL HIGH (ref 70–99)
Potassium: 4 mmol/L (ref 3.5–5.1)
Sodium: 137 mmol/L (ref 135–145)

## 2020-06-07 MED ORDER — DIPHENHYDRAMINE HCL 25 MG PO TABS: 25.0000 mg | ORAL_TABLET | Freq: Four times a day (QID) | ORAL | 0 refills | Status: DC

## 2020-06-07 MED ORDER — LACTATED RINGERS IV BOLUS
1000.0000 mL | Freq: Once | INTRAVENOUS | Status: AC
  Administered 2020-06-07: 1000 mL via INTRAVENOUS

## 2020-06-07 MED ORDER — PREDNISONE 10 MG PO TABS: ORAL_TABLET | ORAL | 0 refills | Status: DC

## 2020-06-07 MED ORDER — FAMOTIDINE IN NACL 20-0.9 MG/50ML-% IV SOLN
20.0000 mg | Freq: Once | INTRAVENOUS | Status: AC
  Administered 2020-06-07: 20 mg via INTRAVENOUS
  Filled 2020-06-07: qty 50

## 2020-06-07 MED ORDER — NOREPINEPHRINE 4 MG/250ML-% IV SOLN: 2.0000 ug/min | INTRAVENOUS | Status: DC

## 2020-06-07 MED ORDER — EPINEPHRINE 0.3 MG/0.3ML IJ SOAJ
0.3000 mg | Freq: Once | INTRAMUSCULAR | Status: AC
  Administered 2020-06-07: 0.3 mg via INTRAMUSCULAR

## 2020-06-07 MED ORDER — ONDANSETRON HCL 4 MG/2ML IJ SOLN: 4.0000 mg | Freq: Once | INTRAMUSCULAR | Status: AC

## 2020-06-07 MED ORDER — EPINEPHRINE 0.3 MG/0.3ML IJ SOAJ: 0.3000 mg | Freq: Once | INTRAMUSCULAR | 2 refills | Status: AC

## 2020-06-07 MED ORDER — EPINEPHRINE 0.3 MG/0.3ML IJ SOAJ: 0.3000 mg | Freq: Once | INTRAMUSCULAR | 2 refills | Status: DC

## 2020-06-07 MED ORDER — PREDNISONE 10 MG PO TABS: ORAL_TABLET | ORAL | 0 refills | Status: AC

## 2020-06-07 MED ORDER — ONDANSETRON HCL 4 MG/2ML IJ SOLN
INTRAMUSCULAR | Status: AC
  Administered 2020-06-07: 4 mg via INTRAVENOUS
  Filled 2020-06-07: qty 2

## 2020-06-07 MED ORDER — ALUM & MAG HYDROXIDE-SIMETH 200-200-20 MG/5ML PO SUSP
30.0000 mL | Freq: Once | ORAL | Status: AC
  Administered 2020-06-07: 30 mL via ORAL
  Filled 2020-06-07: qty 30

## 2020-06-07 MED ORDER — NOREPINEPHRINE 4 MG/250ML-% IV SOLN
INTRAVENOUS | Status: AC
  Filled 2020-06-07: qty 250

## 2020-06-07 MED ORDER — SODIUM CHLORIDE 0.9 % IV SOLN: 250.0000 mL | INTRAVENOUS | Status: DC

## 2020-06-07 NOTE — ED Provider Notes (Signed)
[facility] Emergency Department Provider Note  ____________________________________________  Time seen: Approximately 6:20 PM  I have reviewed the triage vital signs and the nursing notes.   HISTORY  Chief Complaint Allergic Reaction    HPI Carol Kent is a 80 y.o. female with no significant past medical history who comes to the ED complaining of shortness of breath after receiving an Orencia infusion in rheumatology clinic.  Infusion had just started, received less than 20% of the dose before symptoms started and that was immediately discontinued and she was brought to the ED.  She received 125 mg of Solu-Medrol, 50 mg of Benadryl IV by rheumatology staff on arrival to the ED.  Patient also complains of nausea, no throat swelling, no cough.  Symptoms are sudden onset, constant, worsening.  Past Medical History:  Diagnosis Date  . Brain aneurysm   . History of cervical fracture      Patient Active Problem List   Diagnosis Date Noted  . 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 Pancytopenia ([place])   . Acute kidney injury superimposed on CKD ([place]) 11/13/2019  . Hypokalemia 11/13/2019  . Diarrhea 11/13/2019  . LLQ pain 11/13/2019  . Rheumatoid arthritis ([place]) 11/13/2019     Past Surgical History:  Procedure Laterality Date  . COLONOSCOPY WITH PROPOFOL N/A 11/14/2019   Procedure: COLONOSCOPY WITH PROPOFOL;  Surgeon: Jonathon Bellows, MD;  Location: [facility] ENDOSCOPY;  Service: Gastroenterology;  Laterality: N/A;     Prior to Admission medications   Medication Sig Start Date End Date Taking? Authorizing Provider  diphenhydrAMINE (BENADRYL) 25 MG tablet Take 1 tablet (25 mg total) by mouth every 6 (six) hours for 3 days. 06/07/20 06/10/20 Yes Carrie Mew, MD  EPINEPHrine 0.3 mg/0.3 mL IJ SOAJ injection Inject 0.3 mg into the muscle once for 1 dose. Follow package instructions as needed for severe allergy or anaphylactic reaction. 06/07/20 06/07/20 Yes  Carrie Mew, MD  predniSONE (DELTASONE) 10 MG tablet Take 5 tablets (50 mg total) by mouth daily for 3 days, THEN 3 tablets (30 mg total) daily for 3 days, THEN 1 tablet (10 mg total) daily for 3 days. 06/07/20 06/16/20 Yes Carrie Mew, MD  cholestyramine Lucrezia Starch) 4 g packet Take 1 packet (4 g total) by mouth 2 (two) times daily. 11/17/19   Loletha Grayer, MD  loperamide (IMODIUM) 2 MG capsule Take 2 capsules (4 mg total) by mouth every 8 (eight) hours as needed for diarrhea or loose stools. 11/17/19   Loletha Grayer, MD  vitamin B-12 1000 MCG tablet Take 1 tablet (1,000 mcg total) by mouth daily. 11/18/19   Loletha Grayer, MD     Allergies Codeine, Erythromycin, Fentanyl, Morphine and related, Propoxyphene, Sulfa antibiotics, Other, and Penicillins   History reviewed. No pertinent family history.  Social History Social History   Tobacco Use  . Smoking status: Never Smoker  . Smokeless tobacco: Never Used  Substance Use Topics  . Alcohol use: No    Review of Systems  Constitutional:   No fever or chills.  ENT:   No sore throat. No rhinorrhea. Cardiovascular:   No chest pain or syncope. Respiratory:   Positive shortness of breath without cough. Gastrointestinal:   Negative for abdominal pain, vomiting and diarrhea.  Musculoskeletal:   Negative for focal pain or swelling All other systems reviewed and are negative except as documented above in ROS and HPI.  ____________________________________________   PHYSICAL EXAM:  VITAL SIGNS: ED Triage Vitals  Enc Vitals Group  BP 06/07/20 1405 136/72     Pulse Rate 06/07/20 1405 93     Resp 06/07/20 1405 (!) 38     Temp --      Temp src --      SpO2 06/07/20 1405 98 %     Weight 06/07/20 1348 155 lb (70.3 kg)     Height 06/07/20 1348 '5\' 2"'$  (1.575 m)     Head Circumference --      Peak Flow --      Pain Score 06/07/20 1348 0     Pain Loc --      Pain Edu? --      Excl. in [place]? --      Vital signs  reviewed, nursing assessments reviewed. Initial blood pressure on arrival about 85/50.  Constitutional:   Alert and oriented.  Ill-appearing. Eyes:   Conjunctivae are normal. EOMI. PERRL. ENT      Head:   Normocephalic and atraumatic.      Nose:   Normal.      Mouth/Throat: Normal, moist mucosa.  No tongue swelling or elevation.      Neck:   No meningismus. Full ROM. Hematological/Lymphatic/Immunilogical:   No cervical lymphadenopathy. Cardiovascular:   RRR. Symmetric bilateral radial and DP pulses.  No murmurs. Cap refill less than 2 seconds. Respiratory:   Normal respiratory effort without tachypnea/retractions. Breath sounds are clear and equal bilaterally. No wheezes/rales/rhonchi.  No stridor. Gastrointestinal:   Soft and nontender. Non distended. There is no CVA tenderness.  No rebound, rigidity, or guarding.  Musculoskeletal:   Normal range of motion in all extremities. No joint effusions.  No lower extremity tenderness.  No edema. Neurologic:   Normal speech and language.  Motor grossly intact. No acute focal neurologic deficits are appreciated.  Skin:    Skin is warm, dry and intact.  Generalized urticarial rash developing.  No petechiae, purpura, or bullae.  ____________________________________________    LABS (pertinent positives/negatives) (all labs ordered are listed, but only abnormal results are displayed) Labs Reviewed  BASIC METABOLIC PANEL - Abnormal; Notable for the following components:      Result Value   CO2 19 (*)    Glucose, Bld 210 (*)    BUN 34 (*)    Creatinine, Ser 1.80 (*)    Calcium 8.8 (*)    GFR, Estimated 28 (*)    All other components within normal limits  CBC WITH DIFFERENTIAL/PLATELET - Abnormal; Notable for the following components:   Abs Immature Granulocytes 0.16 (*)    All other components within normal limits  TROPONIN I (HIGH SENSITIVITY)  TROPONIN I (HIGH SENSITIVITY)    ____________________________________________   EKG Interpreted by me Normal sinus rhythm rate of 94, normal axis and intervals.  Normal QRS ST segments and T waves.   ____________________________________________    [id number]  DG Chest Portable 1 View  Result Date: 06/07/2020 CLINICAL DATA:  Anaphylaxis, hypoxia EXAM: PORTABLE CHEST 1 VIEW COMPARISON:  03/16/2016 FINDINGS: Cardiomegaly. Both lungs are clear. The visualized skeletal structures are unremarkable. IMPRESSION: Cardiomegaly without acute abnormality of the lungs in AP portable projection. Electronically Signed   By: Eddie Candle M.D.   On: 06/07/2020 15:06    ____________________________________________   PROCEDURES .Critical Care Performed by: Carrie Mew, MD Authorized by: Carrie Mew, MD   Critical care provider statement:    Critical care time (minutes):  32   Critical care time was exclusive of:  Separately billable procedures and treating other patients   Critical care  was necessary to treat or prevent imminent or life-threatening deterioration of the following conditions:  Circulatory failure and shock   Critical care was time spent personally by me on the following activities:  Development of treatment plan with patient or surrogate, discussions with consultants, evaluation of patient's response to treatment, examination of patient, obtaining history from patient or surrogate, ordering and performing treatments and interventions, ordering and review of laboratory studies, ordering and review of radiographic studies, pulse oximetry, re-evaluation of patient's condition and review of old charts    ____________________________________________    CLINICAL IMPRESSION / ASSESSMENT AND PLAN / ED COURSE  Medications ordered in the ED: Medications  0.9 %  sodium chloride infusion (250 mLs Intravenous Patient Refused/Not Given 06/07/20 1406)  EPINEPHrine (EPI-PEN) injection 0.3 mg (0.3 mg  Intramuscular Given 06/07/20 1355)  famotidine (PEPCID) IVPB 20 mg premix (0 mg Intravenous Stopped 06/07/20 1443)  lactated ringers bolus 1,000 mL (0 mLs Intravenous Stopped 06/07/20 1524)  ondansetron (ZOFRAN) injection 4 mg (4 mg Intravenous Given 06/07/20 1412)    Pertinent labs & imaging results that were available during my care of the patient were reviewed by me and considered in my medical decision making (see chart for details).  KERTINA MEHRER was evaluated in Emergency Department on 06/07/2020 for the symptoms described in the history of present illness. She was evaluated in the context of the global COVID-19 pandemic, which necessitated consideration that the patient might be at risk for infection with the SARS-CoV-2 virus that causes COVID-19. Institutional protocols and algorithms that pertain to the evaluation of patients at risk for COVID-19 are in a state of rapid change based on information released by regulatory bodies including the CDC and federal and state organizations. These policies and algorithms were followed during the patient's care in the ED.     Clinical Course as of 06/07/20 1820  Thu Jun 07, 2020  1416 Patient brought to ED from rheumatology clinic where she had just received an Orencia infusion and had an immediate anaphylactic reaction.  She presents with shortness of breath and hypoxia, diffuse urticarial rash, and hypotension.  She was given Benadryl 50 mg IV, Solu-Medrol 125 mg IV and EpiPen IM immediately on arrival.  Blood pressure normalized after EpiPen and with infusion of IV fluids.  We will keep Levophed in the room on standby and monitor. [PS]  1417 Patient reported some chest discomfort after EpiPen.  Will trend troponin. [PS]    Clinical Course User Index [PS] Carrie Mew, MD    ----------------------------------------- 6:26 PM on 06/07/2020 -----------------------------------------  Labs all normal.  Chest x-ray normal.  Patient symptoms  have completely resolved, blood pressure has remained stable after initiating medications and IV fluids.  No persistent signs of allergic reaction, no shock.  Did not require pressors.  Will discharge home with continued prednisone, Benadryl, EpiPen on hand.  Return precautions discussed.   ____________________________________________   FINAL CLINICAL IMPRESSION(S) / ED DIAGNOSES    Final diagnoses:  Anaphylaxis, initial encounter     ED Discharge Orders         Ordered    predniSONE (DELTASONE) 10 MG tablet        06/07/20 1819    EPINEPHrine 0.3 mg/0.3 mL IJ SOAJ injection   Once        06/07/20 1819    diphenhydrAMINE (BENADRYL) 25 MG tablet  Every 6 hours        06/07/20 1819          Portions  of this note were generated with dragon dictation software. Dictation errors may occur despite best attempts at proofreading.   Carrie Mew, MD 06/07/20 [phone]

## 2020-06-07 NOTE — ED Triage Notes (Signed)
Pt to ED via stretcher from [facility] with c/o anaphylaxis to [place] for rheumatoid arthritis. Per [facility] Staff pt received approx 74m of 1035mbag of medication. Upon arrival pt c/o feeling hot and nauseated.   24g to R AC   '125mg'$  Solumedrol '50mg'$  Benadryl

## 2020-07-09 ENCOUNTER — Ambulatory Visit: Payer: Medicare Other | Admitting: Cardiology

## 2020-07-16 ENCOUNTER — Ambulatory Visit (INDEPENDENT_AMBULATORY_CARE_PROVIDER_SITE_OTHER): Payer: Medicare Other | Admitting: Cardiovascular Disease

## 2020-07-16 ENCOUNTER — Other Ambulatory Visit: Payer: Self-pay

## 2020-07-16 ENCOUNTER — Encounter: Payer: Self-pay | Admitting: Cardiovascular Disease

## 2020-07-16 VITALS — BP 130/68 | HR 93 | Ht 62.0 in | Wt 158.0 lb

## 2020-07-16 DIAGNOSIS — I10 Essential (primary) hypertension: Secondary | ICD-10-CM | POA: Diagnosis not present

## 2020-07-16 DIAGNOSIS — R0602 Shortness of breath: Secondary | ICD-10-CM

## 2020-07-16 DIAGNOSIS — I6521 Occlusion and stenosis of right carotid artery: Secondary | ICD-10-CM

## 2020-07-16 DIAGNOSIS — I739 Peripheral vascular disease, unspecified: Secondary | ICD-10-CM | POA: Diagnosis not present
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
## 2020-07-16 NOTE — Patient Instructions (Signed)
Please call if shortness of breath gets worse   Medication Instructions:  No changes  If you need a refill on your cardiac medications before your next appointment, please call your pharmacy.    Lab work: No new labs needed   If you have labs (blood work) drawn today and your tests are completely normal, you will receive your results only by: [name] MyChart Message (if you have MyChart) OR . A paper copy in the mail If you have any lab test that is abnormal or we need to change your treatment, we will call you to review the results.   Testing/Procedures: No new testing needed   Follow-Up: At [facility], you and your health needs are our priority.  As part of our continuing mission to provide you with exceptional heart care, we have created designated Provider Care Teams.  These Care Teams include your primary Cardiologist (physician) and Advanced Practice Providers (APPs -  Physician Assistants and Nurse Practitioners) who all work together to provide you with the care you need, when you need it.  . You will need a follow up appointment as needed  . Providers on your designated Care Team:   . Murray Hodgkins, NP . Christell Faith, PA-C . Marrianne Mood, PA-C  Any Other Special Instructions Will Be Listed Below (If Applicable).  COVID-19 Vaccine Information can be found at: ShippingScam.co.uk For questions related to vaccine distribution or appointments, please email [email or web address] or call [phone].

## 2020-09-29 ENCOUNTER — Ambulatory Visit
Admission: EM | Admit: 2020-09-29 | Discharge: 2020-09-29 | Disposition: A | Discharge status: Home / Self Care | Payer: Medicare Other

## 2020-09-29 DIAGNOSIS — S81811A Laceration without foreign body, right lower leg, initial encounter: Secondary | ICD-10-CM | POA: Diagnosis not present

## 2020-09-29 NOTE — Discharge Instructions (Addendum)
Keep your skin tear covered with a dry, nonstick dressing until a scab is formed.  Once a scab forms you can leave the area open to air.  Discontinue use of hydrogen peroxide and Neosporin daily.  If you sustain another skin tear you may treat the area for 1 to 2 days with topical bacitracin and then discontinue in favor of a dry nonstick dressing as a scab forms.  After have a scab formation you can leave the area open to air as well.

## 2020-09-29 NOTE — ED Triage Notes (Signed)
Pt c/o injury to lower right leg from a scrape/skin tear that happened about a week ago. Pt is concerned that it is not improving. She has been using peroxide and neosporin to the area.

## 2020-09-29 NOTE — ED Provider Notes (Signed)
[facility]    CSN: [id number] Arrival date & time: 09/29/20  1445      History   Chief Complaint Chief Complaint  Patient presents with   skin tear    HPI Carol Kent is a 80 y.o. female.   HPI  80 year old female here for evaluation of right leg wound.  Patient reports that she injured her right lower leg getting out of car 2 weeks ago and she is concerned because it has not healed.  She reports that she has been using hydrogen peroxide and Neosporin daily on the wound.  She does put a dressing on top and she does report that there is a scant amount of yellow drainage on the dressing when she removes it.  There is also some redness surrounding the wound that is concerning to her.  She has not had a fever.  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      Home Medications    Prior to Admission medications   Medication Sig Start Date End Date Taking? Authorizing Provider  BANOPHEN 25 MG capsule Take 25 mg by mouth every 6 (six) hours. 06/07/20  Yes [provider]  EPINEPHrine 0.3 mg/0.3 mL IJ SOAJ injection Inject 0.3 mg into the skin as directed. 06/07/20  Yes [provider]  hydroxychloroquine (PLAQUENIL) 200 MG tablet Take 200 mg by mouth in the morning and at bedtime. 01/19/20  Yes [provider]  KLOR-CON M10 10 MEQ tablet Take 10 mEq by mouth daily. 07/14/20  Yes [provider]  potassium chloride (KLOR-CON) 10 MEQ tablet Take 1 tablet by mouth daily. 01/18/20  Yes [provider]  predniSONE (DELTASONE) 5 MG tablet Take 5 mg by mouth daily. 05/13/20  Yes [provider]  triamterene-hydrochlorothiazide (MAXZIDE-25) 37.5-25 MG tablet Take 1 tablet by mouth daily.   Yes [provider]  vitamin B-12 1000 MCG tablet Take 1 tablet (1,000 mcg total) by mouth daily. 11/18/19  Yes Loletha Grayer, MD    Family History History reviewed. No pertinent family history.  Social History Social History   Tobacco Use   Smoking status: Never   Smokeless tobacco: Never  Vaping Use   Vaping Use: Never used  Substance Use Topics   Alcohol use: No   Drug use: Never     Allergies   Codeine, Erythromycin, Fentanyl, Morphine and related, Orencia [abatacept], Propoxyphene, Sulfa antibiotics, Other, and Penicillins   Review of Systems Review of Systems  Constitutional:  Negative for activity change, appetite change and fever.  Skin:  Positive for color change and wound.  Hematological: Negative.   Psychiatric/Behavioral: Negative.      Physical Exam Triage Vital Signs ED Triage Vitals  Enc Vitals Group     BP 09/29/20 1504 [name])  131/56     Pulse Rate 09/29/20 1504 88     Resp 09/29/20 1504 18     Temp 09/29/20 1504 98.3 F (36.8 C)     Temp Source 09/29/20 1504 Oral     SpO2 09/29/20 1504 99 %     Weight 09/29/20 1500 156 lb (70.8 kg)     Height 09/29/20 1500 '5\' 3"'$  (1.6 m)     Head Circumference --      Peak Flow --      Pain Score 09/29/20 1459 0     Pain Loc --      Pain Edu? --      Excl. in [place]? --    No data found.  Updated Vital Signs BP (!) 131/56 (BP Location: Left Arm)   Pulse 88   Temp 98.3 F (36.8 C) (Oral)   Resp 18   Ht '5\' 3"'$  (1.6 m)   Wt 156 lb (70.8 kg)   SpO2 99%   BMI 27.63 kg/m   Visual Acuity Right Eye  Distance:   Left Eye Distance:   Bilateral Distance:    Right Eye Near:   Left Eye Near:    Bilateral Near:     Physical Exam Vitals and nursing note reviewed.  Constitutional:      General: She is not in acute distress.    Appearance: Normal appearance. She is not ill-appearing.  Musculoskeletal:        General: Signs of injury present.  Skin:    General: Skin is warm and dry.     Capillary Refill: Capillary refill takes less than 2 seconds.     Findings: Erythema present. No bruising.  Neurological:     General: No focal deficit present.     Mental Status: She is alert and oriented to person, place, and time.  Psychiatric:        Mood and Affect: Mood normal.        Behavior: Behavior normal.        Thought Content: Thought content normal.        Judgment: Judgment normal.     UC Treatments / Results  Labs (all labs ordered are listed, but only abnormal results are displayed) Labs Reviewed - No data to display  EKG   Radiology No results found.  Procedures Procedures (including critical care time)  Medications Ordered in UC Medications - No data to display  Initial Impression / Assessment and Plan / UC Course  I have reviewed the triage vital signs and the nursing notes.  Pertinent labs & imaging results that were available during my care of the patient were reviewed by me and considered in my medical decision making (see chart for details).  Patient is a very pleasant 80 year old female here for evaluation of nonhealing right lower leg skin tear as outlined in HPI above.  Patient's physical exam reveals a skin tear that is approximately the size of a quarter that has a healthy pink wound bed with some surrounding erythema of the skin at the border.  The area is not hot or swollen.  There is no fluctuance or induration.  The skin flap from where the initial tear occurred is white and macerated.  This is most likely secondary to daily cleansing with peroxide and  daily application of Neosporin antibiotic ointment.  Additionally, patient has a small skin tear superior to that that she had occurred today when getting out of her car.  Patient advised to stop using  hydroperoxide and Neosporin daily and to just apply a nonadherent dressing and allow the scab to form.  Once the scab is formed she can leave the area open to air.  There is no evidence of infection or cellulitis to the right lower leg.   Final Clinical Impressions(s) / UC Diagnoses   Final diagnoses:  Noninfected skin tear of leg, right, initial encounter     Discharge Instructions      Keep your skin tear covered with a dry, nonstick dressing until a scab is formed.  Once a scab forms you can leave the area open to air.  Discontinue use of hydrogen peroxide and Neosporin daily.  If you sustain another skin tear you may treat the area for 1 to 2 days with topical bacitracin and then discontinue in favor of a dry nonstick dressing as a scab forms.  After have a scab formation you can leave the area open to air as well.     ED Prescriptions   None    PDMP not reviewed this encounter.   Margarette Canada, NP 09/29/20 1525

## 2020-11-04 ENCOUNTER — Emergency Department: Payer: Medicare Other

## 2020-11-04 ENCOUNTER — Inpatient Hospital Stay
Admission: EM | Admit: 2020-11-04 | Discharge: 2020-11-07 | DRG: 178 | Disposition: A | Discharge status: Home Health | Payer: Medicare Other | Attending: Internal Medicine | Admitting: Internal Medicine

## 2020-11-04 ENCOUNTER — Other Ambulatory Visit: Payer: Self-pay

## 2020-11-04 DIAGNOSIS — Z7952 Long term (current) use of systemic steroids: Secondary | ICD-10-CM | POA: Diagnosis not present

## 2020-11-04 DIAGNOSIS — N184 Chronic kidney disease, stage 4 (severe): Secondary | ICD-10-CM | POA: Diagnosis present

## 2020-11-04 DIAGNOSIS — E860 Dehydration: Secondary | ICD-10-CM | POA: Diagnosis present

## 2020-11-04 DIAGNOSIS — M069 Rheumatoid arthritis, unspecified: Secondary | ICD-10-CM | POA: Diagnosis present

## 2020-11-04 DIAGNOSIS — R55 Syncope and collapse: Secondary | ICD-10-CM | POA: Diagnosis present

## 2020-11-04 DIAGNOSIS — Z888 Allergy status to other drugs, medicaments and biological substances status: Secondary | ICD-10-CM

## 2020-11-04 DIAGNOSIS — I129 Hypertensive chronic kidney disease with stage 1 through stage 4 chronic kidney disease, or unspecified chronic kidney disease: Secondary | ICD-10-CM | POA: Diagnosis present

## 2020-11-04 DIAGNOSIS — Z882 Allergy status to sulfonamides status: Secondary | ICD-10-CM

## 2020-11-04 DIAGNOSIS — U071 COVID-19: Principal | ICD-10-CM | POA: Diagnosis present

## 2020-11-04 DIAGNOSIS — E876 Hypokalemia: Secondary | ICD-10-CM | POA: Diagnosis present

## 2020-11-04 DIAGNOSIS — Z880 Allergy status to penicillin: Secondary | ICD-10-CM

## 2020-11-04 DIAGNOSIS — I4891 Unspecified atrial fibrillation: Secondary | ICD-10-CM | POA: Diagnosis not present

## 2020-11-04 DIAGNOSIS — I480 Paroxysmal atrial fibrillation: Secondary | ICD-10-CM | POA: Diagnosis present

## 2020-11-04 DIAGNOSIS — Z28310 Unvaccinated for covid-19: Secondary | ICD-10-CM

## 2020-11-04 DIAGNOSIS — N179 Acute kidney failure, unspecified: Secondary | ICD-10-CM | POA: Diagnosis present

## 2020-11-04 DIAGNOSIS — Z885 Allergy status to narcotic agent status: Secondary | ICD-10-CM | POA: Diagnosis not present

## 2020-11-04 DIAGNOSIS — Z79899 Other long term (current) drug therapy: Secondary | ICD-10-CM | POA: Diagnosis not present

## 2020-11-04 DIAGNOSIS — Z881 Allergy status to other antibiotic agents status: Secondary | ICD-10-CM

## 2020-11-04 DIAGNOSIS — A0839 Other viral enteritis: Secondary | ICD-10-CM | POA: Diagnosis present

## 2020-11-04 DIAGNOSIS — R609 Edema, unspecified: Secondary | ICD-10-CM

## 2020-11-04 LAB — LIPASE, BLOOD: Lipase: 51 U/L (ref 11–51)

## 2020-11-04 LAB — RESP PANEL BY RT-PCR (FLU A&B, COVID) ARPGX2
Influenza A by PCR: NEGATIVE
Influenza B by PCR: NEGATIVE
SARS Coronavirus 2 by RT PCR: POSITIVE — AB

## 2020-11-04 LAB — CBC WITH DIFFERENTIAL/PLATELET
Abs Immature Granulocytes: 0.05 10*3/uL (ref 0.00–0.07)
Basophils Absolute: 0 10*3/uL (ref 0.0–0.1)
Basophils Relative: 0 %
Eosinophils Absolute: 0 10*3/uL (ref 0.0–0.5)
Eosinophils Relative: 0 %
HCT: 32.6 % — ABNORMAL LOW (ref 36.0–46.0)
Hemoglobin: 11.3 g/dL — ABNORMAL LOW (ref 12.0–15.0)
Immature Granulocytes: 1 %
Lymphocytes Relative: 15 %
Lymphs Abs: 0.9 10*3/uL (ref 0.7–4.0)
MCH: 30.4 pg (ref 26.0–34.0)
MCHC: 34.7 g/dL (ref 30.0–36.0)
MCV: 87.6 fL (ref 80.0–100.0)
Monocytes Absolute: 0.6 10*3/uL (ref 0.1–1.0)
Monocytes Relative: 10 %
Neutro Abs: 4.3 10*3/uL (ref 1.7–7.7)
Neutrophils Relative %: 74 %
Platelets: 145 10*3/uL — ABNORMAL LOW (ref 150–400)
RBC: 3.72 MIL/uL — ABNORMAL LOW (ref 3.87–5.11)
RDW: 13.5 % (ref 11.5–15.5)
WBC: 5.9 10*3/uL (ref 4.0–10.5)
nRBC: 0 % (ref 0.0–0.2)

## 2020-11-04 LAB — COMPREHENSIVE METABOLIC PANEL
ALT: 12 U/L (ref 0–44)
AST: 26 U/L (ref 15–41)
Albumin: 3.8 g/dL (ref 3.5–5.0)
Alkaline Phosphatase: 59 U/L (ref 38–126)
Anion gap: 13 (ref 5–15)
BUN: 28 mg/dL — ABNORMAL HIGH (ref 8–23)
CO2: 19 mmol/L — ABNORMAL LOW (ref 22–32)
Calcium: 8.9 mg/dL (ref 8.9–10.3)
Chloride: 105 mmol/L (ref 98–111)
Creatinine, Ser: 2.56 mg/dL — ABNORMAL HIGH (ref 0.44–1.00)
GFR, Estimated: 18 mL/min — ABNORMAL LOW (ref >60)
Glucose, Bld: 118 mg/dL — ABNORMAL HIGH (ref 70–99)
Potassium: 3.9 mmol/L (ref 3.5–5.1)
Sodium: 137 mmol/L (ref 135–145)
Total Bilirubin: 1.4 mg/dL — ABNORMAL HIGH (ref 0.3–1.2)
Total Protein: 7 g/dL (ref 6.5–8.1)

## 2020-11-04 LAB — TROPONIN I (HIGH SENSITIVITY)
Troponin I (High Sensitivity): 35 ng/L — ABNORMAL HIGH (ref <18)
Troponin I (High Sensitivity): 30 ng/L — ABNORMAL HIGH (ref <18)

## 2020-11-04 LAB — LACTIC ACID, PLASMA
Lactic Acid, Venous: 3.1 mmol/L (ref 0.5–1.9)
Lactic Acid, Venous: 1.2 mmol/L (ref 0.5–1.9)

## 2020-11-04 LAB — MAGNESIUM: Magnesium: 1.8 mg/dL (ref 1.7–2.4)

## 2020-11-04 LAB — CBG MONITORING, ED: Glucose-Capillary: 104 mg/dL — ABNORMAL HIGH (ref 70–99)

## 2020-11-04 MED ORDER — ACETAMINOPHEN 325 MG PO TABS: 650.0000 mg | ORAL_TABLET | Freq: Four times a day (QID) | ORAL | Status: DC | PRN

## 2020-11-04 MED ORDER — POTASSIUM CHLORIDE CRYS ER 10 MEQ PO TBCR
10.0000 meq | EXTENDED_RELEASE_TABLET | Freq: Every day | ORAL | Status: DC
  Administered 2020-11-05 – 2020-11-07 (×3): 10 meq via ORAL
  Filled 2020-11-04 (×3): qty 1

## 2020-11-04 MED ORDER — HEPARIN SODIUM (PORCINE) 5000 UNIT/ML IJ SOLN
5000.0000 [IU] | Freq: Three times a day (TID) | INTRAMUSCULAR | Status: DC
  Administered 2020-11-04 – 2020-11-07 (×8): 5000 [IU] via SUBCUTANEOUS
  Filled 2020-11-04 (×8): qty 1

## 2020-11-04 MED ORDER — LACTATED RINGERS IV SOLN: INTRAVENOUS | Status: AC

## 2020-11-04 MED ORDER — SODIUM CHLORIDE 0.9 % IV SOLN
100.0000 mg | Freq: Every day | INTRAVENOUS | Status: AC
  Administered 2020-11-05 – 2020-11-06 (×2): 100 mg via INTRAVENOUS
  Filled 2020-11-04 (×2): qty 100

## 2020-11-04 MED ORDER — TRAZODONE HCL 50 MG PO TABS
25.0000 mg | ORAL_TABLET | Freq: Every evening | ORAL | Status: DC | PRN
  Administered 2020-11-04 – 2020-11-05 (×2): 25 mg via ORAL
  Filled 2020-11-04 (×2): qty 1

## 2020-11-04 MED ORDER — HYDROXYCHLOROQUINE SULFATE 200 MG PO TABS
200.0000 mg | ORAL_TABLET | Freq: Two times a day (BID) | ORAL | Status: DC
  Administered 2020-11-04 – 2020-11-07 (×6): 200 mg via ORAL
  Filled 2020-11-04 (×7): qty 1

## 2020-11-04 MED ORDER — SODIUM CHLORIDE 0.9 % IV SOLN
200.0000 mg | Freq: Once | INTRAVENOUS | Status: AC
  Administered 2020-11-04: 200 mg via INTRAVENOUS
  Filled 2020-11-04: qty 40

## 2020-11-04 MED ORDER — ONDANSETRON HCL 4 MG/2ML IJ SOLN: 4.0000 mg | Freq: Four times a day (QID) | INTRAMUSCULAR | Status: DC | PRN

## 2020-11-04 MED ORDER — POLYETHYLENE GLYCOL 3350 17 G PO PACK: 17.0000 g | PACK | Freq: Every day | ORAL | Status: DC | PRN

## 2020-11-04 MED ORDER — ONDANSETRON HCL 4 MG/2ML IJ SOLN
INTRAMUSCULAR | Status: AC
  Filled 2020-11-04: qty 2

## 2020-11-04 MED ORDER — ASCORBIC ACID 500 MG PO TABS
500.0000 mg | ORAL_TABLET | Freq: Every day | ORAL | Status: DC
  Administered 2020-11-04 – 2020-11-07 (×4): 500 mg via ORAL
  Filled 2020-11-04 (×4): qty 1

## 2020-11-04 MED ORDER — PREDNISONE 10 MG PO TABS
5.0000 mg | ORAL_TABLET | Freq: Every day | ORAL | Status: DC
  Administered 2020-11-05 – 2020-11-07 (×3): 5 mg via ORAL
  Filled 2020-11-04 (×3): qty 1

## 2020-11-04 MED ORDER — LOPERAMIDE HCL 2 MG PO CAPS
4.0000 mg | ORAL_CAPSULE | ORAL | Status: DC | PRN
  Administered 2020-11-04: 4 mg via ORAL
  Filled 2020-11-04: qty 2

## 2020-11-04 MED ORDER — LACTATED RINGERS IV BOLUS
1000.0000 mL | Freq: Once | INTRAVENOUS | Status: AC
  Administered 2020-11-04: 1000 mL via INTRAVENOUS

## 2020-11-04 MED ORDER — VITAMIN B-12 1000 MCG PO TABS
1000.0000 ug | ORAL_TABLET | Freq: Every day | ORAL | Status: DC
  Administered 2020-11-05 – 2020-11-07 (×3): 1000 ug via ORAL
  Filled 2020-11-04 (×3): qty 1

## 2020-11-04 MED ORDER — ONDANSETRON HCL 4 MG/2ML IJ SOLN
4.0000 mg | Freq: Once | INTRAMUSCULAR | Status: AC
  Administered 2020-11-04: 4 mg via INTRAVENOUS

## 2020-11-04 MED ORDER — GUAIFENESIN-DM 100-10 MG/5ML PO SYRP
10.0000 mL | ORAL_SOLUTION | ORAL | Status: DC | PRN
  Administered 2020-11-06 – 2020-11-07 (×2): 10 mL via ORAL
  Filled 2020-11-04 (×2): qty 10

## 2020-11-04 MED ORDER — ZINC SULFATE 220 (50 ZN) MG PO CAPS
220.0000 mg | ORAL_CAPSULE | Freq: Every day | ORAL | Status: DC
  Administered 2020-11-04 – 2020-11-07 (×4): 220 mg via ORAL
  Filled 2020-11-04 (×4): qty 1

## 2020-11-04 MED ORDER — HYDROCODONE-ACETAMINOPHEN 5-325 MG PO TABS: 1.0000 | ORAL_TABLET | ORAL | Status: DC | PRN

## 2020-11-04 NOTE — ED Notes (Signed)
Patient transported to CT 

## 2020-11-04 NOTE — ED Triage Notes (Signed)
Pt comes pov with generalized pains, n/v/d, chest pain, sob for about a week. Pt unsure if she has hx of afib but is in RVR with EKG.

## 2020-11-04 NOTE — Progress Notes (Signed)
Remdesivir - Pharmacy Brief Note   O:  ALT: 12 CXR:  1. The appearance the chest is concerning for an acute bronchitis, as above. 2. Mild cardiomegaly. 3. Aortic atherosclerosis. SpO2: 99% on room air   A/P:  Remdesivir 200 mg IVPB once followed by 100 mg IVPB daily x 4 days.   Dorena Bodo, PharmD, BCPS 11/04/2020 2:43 PM

## 2020-11-04 NOTE — ED Notes (Signed)
Informed RN bed assigned 

## 2020-11-04 NOTE — ED Notes (Addendum)
Daughter states NVD x few days. Daughter states hx of same thing with admission. Daughter checked on her yesterday and states she did not look good. Went back to pt house today and stated she looked worse so brought her to ER today. Pt found to be in a fib RVR, had a syncopal episode in triage per triage RN. Pt alert and talking but wants daughter to answer questions.

## 2020-11-04 NOTE — ED Provider Notes (Signed)
[facility]  ____________________________________________   Event Date/Time   First MD Initiated Contact with Patient 11/04/20 1029     (approximate)  I have reviewed the triage vital signs and the nursing notes.   HISTORY  Chief Complaint Chest Pain and Emesis    HPI Carol Kent is a 80 y.o. female the patient is with past medical history of rheumatoid arthritis who presents with diarrhea and global weakness.  [name] provides most of the history and notes that she has been sick over the past about 10 days with diarrhea and cough and just feeling weak.  Her daughter saw her yesterday and thought she needed to come to the emergency department but they waited.  She has been able to barely stand up today.  Patient notes that she has had multiple episodes of nonbloody diarrhea over the past 10 days.  She denies chest pain or difficulty breathing.  Does endorse some generalized abdominal pain.  She denies any headache nausea or vomiting.  Patient does not have any history of atrial fibrillation and she is not on any blood thinners.  She lives alone.         Past Medical History:  Diagnosis Date  . Brain aneurysm   . History of cervical fracture     Patient Active Problem List   Diagnosis Date Noted  . 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 Pancytopenia ([place])   . Acute kidney injury superimposed on CKD ([place]) 11/13/2019  . Hypokalemia 11/13/2019  . Diarrhea 11/13/2019  . LLQ pain 11/13/2019  . Rheumatoid arthritis ([place]) 11/13/2019    Past Surgical History:  Procedure Laterality Date  . COLONOSCOPY WITH PROPOFOL N/A 11/14/2019   Procedure: COLONOSCOPY WITH PROPOFOL;  Surgeon: Jonathon Bellows, MD;  Location: [facility] ENDOSCOPY;  Service: Gastroenterology;  Laterality: N/A;  . TUBAL LIGATION      Prior to Admission medications   Medication Sig Start Date End Date Taking? Authorizing Provider  BANOPHEN 25 MG capsule Take 25 mg by mouth every  6 (six) hours. 06/07/20   [provider]  EPINEPHrine 0.3 mg/0.3 mL IJ SOAJ injection Inject 0.3 mg into the skin as directed. 06/07/20   [provider]  hydroxychloroquine (PLAQUENIL) 200 MG tablet Take 200 mg by mouth in the morning and at bedtime. 01/19/20   [provider]  KLOR-CON M10 10 MEQ tablet Take 10 mEq by mouth daily. 07/14/20   [provider]  potassium chloride (KLOR-CON) 10 MEQ tablet Take 1 tablet by mouth daily. 01/18/20   [provider]  predniSONE (DELTASONE) 5 MG tablet Take 5 mg by mouth daily. 05/13/20   [provider]  triamterene-hydrochlorothiazide (MAXZIDE-25) 37.5-25 MG tablet Take 1 tablet by mouth daily.    [provider]  vitamin B-12 1000 MCG tablet Take 1 tablet (1,000 mcg total) by mouth daily. 11/18/19   Loletha Grayer, MD    Allergies Codeine, Erythromycin, Fentanyl, Morphine and related, Orencia [abatacept], Propoxyphene, Sulfa antibiotics, Other, and Penicillins  History reviewed. No pertinent family history.  Social History Social History   Tobacco Use  . Smoking status: Never  . Smokeless tobacco: Never  Vaping Use  . Vaping Use: Never used  Substance Use Topics  . Alcohol use: No  . Drug use: Never    Review of Systems   Review of Systems  Constitutional:  Positive for activity change and appetite change. Negative for chills and fever.  Respiratory:  Positive for cough. Negative  for shortness of breath.   Cardiovascular:  Negative for chest pain and leg swelling.  Gastrointestinal:  Positive for abdominal pain and diarrhea. Negative for blood in stool, nausea and vomiting.  Genitourinary:  Negative for dysuria.  All other systems reviewed and are negative.  Physical Exam Updated Vital Signs BP 122/66   Pulse (!) 103   Temp 97.6 F (36.4 C) (Oral)   Resp (!) 28   Ht '5\' 3"'$  (1.6 m)   Wt 70 kg   SpO2 97%   BMI 27.34 kg/m   Physical Exam Vitals and nursing note  reviewed.  Constitutional:      General: She is not in acute distress.    Appearance: Normal appearance. She is ill-appearing. She is not toxic-appearing.     Comments: Somnolent  HENT:     Head: Normocephalic and atraumatic.     Nose: Nose normal. No congestion.     Mouth/Throat:     Mouth: Mucous membranes are moist.  Eyes:     General: No scleral icterus.    Conjunctiva/sclera: Conjunctivae normal.  Cardiovascular:     Rate and Rhythm: Tachycardia present. Rhythm irregular.  Pulmonary:     Effort: Pulmonary effort is normal. No respiratory distress.     Breath sounds: Normal breath sounds. No wheezing.  Abdominal:     Palpations: Abdomen is soft.     Tenderness: There is abdominal tenderness. There is no guarding.     Comments: Tenderness to palpation diffusely, soft  Musculoskeletal:        General: No swelling, tenderness, deformity or signs of injury. Normal range of motion.     Cervical back: Neck supple. No rigidity.  Skin:    General: Skin is warm and dry.     Coloration: Skin is not jaundiced.  Neurological:     General: No focal deficit present.     Comments: Patient oriented to person and place  Psychiatric:        Mood and Affect: Mood normal.        Behavior: Behavior normal.     LABS (all labs ordered are listed, but only abnormal results are displayed)  Labs Reviewed  RESP PANEL BY RT-PCR (FLU A&B, COVID) ARPGX2 - Abnormal; Notable for the following components:      Result Value   SARS Coronavirus 2 by RT PCR POSITIVE (*)    All other components within normal limits  LACTIC ACID, PLASMA - Abnormal; Notable for the following components:   Lactic Acid, Venous 3.1 (*)    All other components within normal limits  COMPREHENSIVE METABOLIC PANEL - Abnormal; Notable for the following components:   CO2 19 (*)    Glucose, Bld 118 (*)    BUN 28 (*)    Creatinine, Ser 2.56 (*)    Total Bilirubin 1.4 (*)    GFR, Estimated 18 (*)    All other components  within normal limits  CBC WITH DIFFERENTIAL/PLATELET - Abnormal; Notable for the following components:   RBC 3.72 (*)    Hemoglobin 11.3 (*)    HCT 32.6 (*)    Platelets 145 (*)    All other components within normal limits  CBG MONITORING, ED - Abnormal; Notable for the following components:   Glucose-Capillary 104 (*)    All other components within normal limits  TROPONIN I (HIGH SENSITIVITY) - Abnormal; Notable for the following components:   Troponin I (High Sensitivity) 35 (*)    All other components within normal limits  CULTURE, BLOOD (ROUTINE X 2)  CULTURE, BLOOD (ROUTINE X 2)  LACTIC ACID, PLASMA  LIPASE, BLOOD  MAGNESIUM  URINALYSIS, COMPLETE (UACMP) WITH MICROSCOPIC  TROPONIN I (HIGH SENSITIVITY)   ____________________________________________  EKG  Atrial fibrillation with rapid ventricular response, nonspecific ST T wave abnormalities throughout ____________________________________________  RADIOLOGY Almeta Monas, personally viewed and evaluated these images (plain radiographs) as part of my medical decision making, as well as reviewing the written report by the radiologist.  ED MD interpretation: I reviewed the chest x-ray which did not show any acute cardiopulmonary abnormality    ____________________________________________   PROCEDURES  Procedure(s) performed (including Critical Care):  Procedures   ____________________________________________   INITIAL IMPRESSION / ASSESSMENT AND PLAN / ED COURSE     The patient is an 80 year old female who presents with 10 days of illness cough diarrhea and worsening weakness.  On arrival the patient was getting vital signs in triage when she appeared to have either a syncopal episode versus a seizure.  She was taken immediately to room where upon my evaluation she had a right-sided gaze deviation and appeared quite ill but then mental status quickly improved making it more likely that she had syncope.   Vital signs are notable for an elevated heart rate in the 150s and EKG showing atrial fibrillation with RVR, otherwise vital signs within normal limits.  Her abdomen is somewhat tender and she appears dehydrated.  Labs are notable for an AKI but electrolytes okay.  Lactate 3.1.  She has no leukocytosis.  Chest x-ray does not show any pneumonia.  Obtained a CT head given the concern for potential seizure as well as CT of the abdomen pelvis with her abdominal pain that does not show any acute pathology.  She now appears to be in a sinus rhythm after 1 L of fluids, suspect the A. fib was in the setting of dehydration.  Will admit the patient for ongoing hydration and monitoring.  Despite her elevated lactate and initial elevated heart rate, I suspect that her underlying infectious process is COVID and so I do not think that she   Clinical Course as of 11/04/20 1345  Sun Nov 04, 2020  1105 Troponin I (High Sensitivity)(!): 35 [KM]  1106 Lactic Acid, Venous(!!): 3.1 [KM]  1155 IMPRESSION: 1. The appearance the chest is concerning for an acute bronchitis, as above. 2. Mild cardiomegaly. 3. Aortic atherosclerosis.     [KM]  [id number] SARS Coronavirus 2 by RT PCR(!): POSITIVE [KM]    Clinical Course User Index [KM] Rada Hay, MD     ____________________________________________   FINAL CLINICAL IMPRESSION(S) / ED DIAGNOSES  Final diagnoses:  AKI (acute kidney injury) [facility])  Atrial fibrillation with RVR [facility])     ED Discharge Orders     None        Note:  This document was prepared using Dragon voice recognition software and may include unintentional dictation errors.    Rada Hay, MD 11/04/20 [id number]

## 2020-11-04 NOTE — ED Notes (Signed)
Critical lactic 3.1 reported to Dr Starleen Blue

## 2020-11-04 NOTE — H&P (Addendum)
History and Physical    Carol Kent [id number] DOB: Sep 23, 1940 DOA: 11/04/2020  PCP: [place] Pink, MD  Chief Complaint: Pleuritic chest pain, generalized weakness, nausea, diarrhea, poor appetite  HPI: Carol Kent is a 80 y.o. female with a past medical history of rheumatoid arthritis, brain aneurysm, chronic kidney disease stage IV, hypertension.  The patient presented to the emergency department due to nausea, dry heaving, generalized weakness poor appetite and diarrhea for the past week.  She is unvaccinated for COVID-19.  While in triage she did have a brief syncopal episode.  Had a momentary right gaze deviation that self resolved.  No subsequent postictal encephalopathy was noted by the ED provider.  No tongue laceration or urinary incontinence was also noted.  Patient also had A. fib with RVR that resolved after 1 L of IV fluids in the emergency department.  No prior history of seizures.  Daughter is present at bedside as well.  States that she has not been eating and drinking for the past week as well.  He is not encephalopathic upon my exam.  Describes diarrhea as clear.  Nonbloody.  She has some chest pain with her dry cough.  No wheezing.    ED Course: COVID-19 PCR, magnesium, lipase CBC, troponins, lactic acid, CMP, chest x-ray, CT head, CT abdomen pelvis, EKG.  Zofran 4 mg IV x1, lactated Ringer 1 L bolus.  Review of Systems: 14 point review of systems is negative except for what is mentioned above in the HPI.   Past Medical History:  Diagnosis Date   Brain aneurysm    History of cervical fracture     Past Surgical History:  Procedure Laterality Date   COLONOSCOPY WITH PROPOFOL N/A 11/14/2019   Procedure: COLONOSCOPY WITH PROPOFOL;  Surgeon: Jonathon Bellows, MD;  Location: [facility] ENDOSCOPY;  Service: Gastroenterology;  Laterality: N/A;   TUBAL LIGATION      Social History   Socioeconomic History   Marital status: Married    Spouse name: Not on file   Number of  children: Not on file   Years of education: Not on file   Highest education level: Not on file  Occupational History   Not on file  Tobacco Use   Smoking status: Never   Smokeless tobacco: Never  Vaping Use   Vaping Use: Never used  Substance and Sexual Activity   Alcohol use: No   Drug use: Never   Sexual activity: Not on file  Other Topics Concern   Not on file  Social History Narrative   Not on file   Social Determinants of Health   Financial Resource Strain: Not on file  Food Insecurity: Not on file  Transportation Needs: Not on file  Physical Activity: Not on file  Stress: Not on file  Social Connections: Not on file  Intimate Partner Violence: Not on file    Allergies  Allergen Reactions   Codeine Nausea Only    Other reaction(s): Nausea   Erythromycin Nausea Only   Fentanyl     Other reaction(s): Hallucination   Morphine And Related     Other reaction(s): Other (See Comments)   Orencia [Abatacept]    Propoxyphene Nausea Only    Other reaction(s): Nausea   Sulfa Antibiotics     Other reaction(s): Nausea   Other Rash    Pain med - can not remember name   Penicillins Rash    Other reaction(s): Hives    History reviewed. No pertinent family history.  Prior to  Admission medications   Medication Sig Start Date End Date Taking? Authorizing Provider  Ascorbic Acid (VITAMIN C) 100 MG tablet Take 100 mg by mouth daily.   Yes [provider]  bismuth subsalicylate (PEPTO BISMOL) 262 MG/15ML suspension Take 30 mLs by mouth every 4 (four) hours as needed for diarrhea or loose stools or indigestion.   Yes [provider]  hydroxychloroquine (PLAQUENIL) 200 MG tablet Take 200 mg by mouth in the morning and at bedtime. 01/19/20  Yes [provider]  KLOR-CON M10 10 MEQ tablet Take 10 mEq by mouth daily. 07/14/20  Yes [provider]  predniSONE (DELTASONE) 5 MG tablet Take 5 mg by mouth daily. 05/13/20  Yes [provider]   triamterene-hydrochlorothiazide (MAXZIDE-25) 37.5-25 MG tablet Take 1 tablet by mouth daily.   Yes [provider]  vitamin B-12 1000 MCG tablet Take 1 tablet (1,000 mcg total) by mouth daily. 11/18/19  Yes Wieting, Richard, MD  EPINEPHrine 0.3 mg/0.3 mL IJ SOAJ injection Inject 0.3 mg into the skin as directed. 06/07/20   [provider]    Physical Exam: Vitals:   11/04/20 1400 11/04/20 1430 11/04/20 1554 11/04/20 1622  BP: (!) 101/52 (!) 105/50  110/61  Pulse: 71 74 71 79  Resp: '17 14 16 18  '$ Temp:   97.7 F (36.5 C) 98 F (36.7 C)  TempSrc:   Oral Oral  SpO2: 99% 100% 100% 100%  Weight:      Height:         General:  Appears calm and comfortable and is in NAD Cardiovascular:  RRR, no m/r/g.  Respiratory:   CTA bilaterally with no wheezes/rales/rhonchi.  Normal respiratory effort. Abdomen:  soft, NT, ND, NABS Skin:  no rash or induration seen on limited exam Musculoskeletal:  grossly normal tone BUE/BLE, good ROM, no bony abnormality Lower extremity:  No LE edema.  Limited foot exam with no ulcerations.  2+ distal pulses. Psychiatric:  grossly normal mood and affect, speech fluent and appropriate, AOx3 Neurologic:  CN 2-12 grossly intact, moves all extremities in coordinated fashion, sensation intact    Radiological Exams on Admission: Independently reviewed - see discussion in A/P where applicable  CT ABDOMEN PELVIS WO CONTRAST  Result Date: 11/04/2020 CLINICAL DATA:  Abdominal pain and fever. Nausea, vomiting, diarrhea. EXAM: CT ABDOMEN AND PELVIS WITHOUT CONTRAST TECHNIQUE: Multidetector CT imaging of the abdomen and pelvis was performed following the standard protocol without IV contrast. COMPARISON:  11/13/2019 FINDINGS: Lower chest: Lungs are hyperinflated. Coarse interstitial markings identified at the bases, consistent with scarring. No pleural effusions. Coronary artery calcifications are present. Hepatobiliary: Cholecystectomy. Liver is homogeneous.  No focal mass. Pancreas: Unremarkable. No pancreatic ductal dilatation or surrounding inflammatory changes. Spleen: Normal in size without focal abnormality. Adrenals/Urinary Tract: Adrenal glands are normal. There is bilateral renal parenchymal thinning. No intrarenal calculi or mass. Ureters are unremarkable. The bladder and visualized portion of the urethra are normal. Stomach/Bowel: Small hiatal hernia. Stomach is otherwise normal. Small bowel loops are normal in appearance. There is fluid in the descending colon, consistent with history of diarrhea. Numerous colonic diverticula are present, vertically within the sigmoid colon. No associated inflammatory changes to indicate acute diverticulitis. The appendix is well seen and has a normal appearance. Vascular/Lymphatic: There is atherosclerotic calcification of the abdominal aorta, not associated with aneurysm. No retroperitoneal or mesenteric adenopathy. Reproductive: Uterus is present. No adnexal mass or free pelvic fluid. Other: Abdominal wall is unremarkable. Musculoskeletal: Degenerative changes in the lumbar spine.  No acute abnormality. IMPRESSION: 1. Fluid within the colon, consistent with known diarrhea. 2. Colonic diverticulosis without acute diverticulitis. 3. Cholecystectomy. 4. Bilateral renal parenchymal thinning. No acute urinary tract abnormality. 5. Normal appendix. 6.  Aortic atherosclerosis.  (ICD10-I70.0) Electronically Signed   By: Nolon Nations M.D.   On: 11/04/2020 12:18   CT HEAD WO CONTRAST (5MM)  Result Date: 11/04/2020 CLINICAL DATA:  Altered mental status.  Nausea, vomiting, diarrhea. EXAM: CT HEAD WITHOUT CONTRAST TECHNIQUE: Contiguous axial images were obtained from the base of the skull through the vertex without intravenous contrast. COMPARISON:  None. FINDINGS: Brain: Status post right temporal craniotomy. 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 Mild diffuse parenchymal volume loss,  commensurate with the patient's age. Mild periventricular white matter changes are present likely reflecting the sequela of small vessel ischemia. No evidence of acute intracranial hemorrhage or infarct. No abnormal mass effect or midline shift. No abnormal intra or extra-axial mass lesion. Ventricular size is normal. Cerebellum is unremarkable. Vascular: No asymmetric hyperdense vasculature at the skull base. Skull: No acute fracture Sinuses/Orbits: The orbits are unremarkable. The paranasal sinuses are clear. Other: Mastoid air cells and middle ear cavities are clear. 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 Stable mild senescent change. No evidence of acute intracranial hemorrhage or infarct. Electronically Signed   By: Fidela Salisbury MD   On: 11/04/2020 12:14   DG Chest Portable 1 View  Result Date: 11/04/2020 CLINICAL DATA:  80 year old female with history of chest pain and shortness of breath for 1 week. EXAM: PORTABLE CHEST 1 VIEW COMPARISON:  Chest x-ray 06/07/2020. FINDINGS: Defibrillator pads are noted projecting over the left hemithorax. Lung volumes are normal. No acute consolidative airspace disease. Diffuse peribronchial cuffing and widespread areas of interstitial prominence. No pneumothorax. Chronic bilateral apical pleuroparenchymal thickening and architectural distortion, similar to prior study, most compatible with chronic post infectious or inflammatory scarring. No definite evidence of pulmonary edema. Heart size is mildly enlarged. Upper mediastinal contours are within normal limits. Atherosclerosis in the thoracic aorta. Surgical clips project over the right upper quadrant of the abdomen, likely from prior cholecystectomy. IMPRESSION: 1. The appearance the chest is concerning for an acute bronchitis, as above. 2. Mild cardiomegaly. 3. Aortic atherosclerosis. Electronically Signed   By: Vinnie Langton M.D.   On: 11/04/2020  11:46    EKG: Independently reviewed.  Initial EKG showed A. fib with RVR, rate 167; repeat EKG after 1 L bolus IV fluids sinus rhythm, rate 80.   Labs on Admission: I have personally reviewed the available labs and imaging studies at the time of the admission.  Pertinent labs: COVID-19 PCR positive, BUN 28, creatinine 2.56, blood glucose 118, T bili 1.4, initial troponin 35, repeat troponin 30, lactic acid 3.1, repeat lactic acid 1.2, hemoglobin 11.3, hematocrit 32.6.    Assessment/Plan: Intractable nausea, diarrhea secondary to COVID-19 viral gastroenteritis: The patient will be admitted to the medical/surgical floor under inpatient status.  Contact and droplet precautions will be ordered.  Supportive care with antiemetics and IV fluids.  Monitor renal function closely.  Start remdesivir.  No infiltrate or consolidation seen on chest x-ray therefore will not start any antibiotics for superimposed bacterial pneumonia.  The patient is not hypoxic therefore will not start steroids.  CT imaging noted fluid within the colon consistent with diarrhea.  Start Imodium as needed.  In hospital syncopal episode: This occurred in triage.  Likely secondary to dehydration.  The  ED provider did notice some brief right gaze deviation but no myoclonic jerking, tongue laceration or urinary incontinence.  No subsequent postictal encephalopathy was noted.  Can consider EEG but seizure-like episode seems less likely.  New onset paroxysmal A. fib with rapid ventricular response: This resolved after IV fluid bolus in the emergency department.  Continue monitoring on telemetry.  Acute kidney injury superimposed on chronic kidney disease stage IV: Avoid nephrotoxic medications.  Plan as above.  Likely prerenal due GI loss/dehydration.  Hold home Maxzide due to the acute kidney injury. Baseline Cr appears to be around 1.7-1.8.  Hypertension: Home Maxzide on hold due to acute kidney injury.  Rheumatoid arthritis: Not in  acute flare. Continue home Plaquenil 200 mg twice daily and prednisone 5 mg daily.  Level of Care: MedSurg DVT prophylaxis: Heparin subcu Code Status: Full code Consults: None Admission status: Inpatient   Leslee Home DO [facility]   How to contact the [facility] Attending or Consulting provider [place] or covering provider during after hours [place], for this patient?  Check the care team in [facility] and look for a) attending/consulting TRH provider listed and b) the [facility] team listed Log into [email or web address] and use [place]'s universal password to access. If you do not have the password, please contact the hospital operator. Locate the [facility] provider you are looking for under [facility] and page to a number that you can be directly reached. If you still have difficulty reaching the provider, please page the [facility] (Director on Call) for the Hospitalists listed on amion for assistance.   11/04/2020, 8:08 PM

## 2020-11-05 ENCOUNTER — Inpatient Hospital Stay: Payer: Medicare Other

## 2020-11-05 DIAGNOSIS — U071 COVID-19: Principal | ICD-10-CM

## 2020-11-05 DIAGNOSIS — I4891 Unspecified atrial fibrillation: Secondary | ICD-10-CM

## 2020-11-05 DIAGNOSIS — E876 Hypokalemia: Secondary | ICD-10-CM

## 2020-11-05 LAB — COMPREHENSIVE METABOLIC PANEL
ALT: 19 U/L (ref 0–44)
AST: 58 U/L — ABNORMAL HIGH (ref 15–41)
Albumin: 2.8 g/dL — ABNORMAL LOW (ref 3.5–5.0)
Alkaline Phosphatase: 74 U/L (ref 38–126)
Anion gap: 9 (ref 5–15)
BUN: 27 mg/dL — ABNORMAL HIGH (ref 8–23)
CO2: 24 mmol/L (ref 22–32)
Calcium: 7.8 mg/dL — ABNORMAL LOW (ref 8.9–10.3)
Chloride: 105 mmol/L (ref 98–111)
Creatinine, Ser: 2.16 mg/dL — ABNORMAL HIGH (ref 0.44–1.00)
GFR, Estimated: 22 mL/min — ABNORMAL LOW (ref >60)
Glucose, Bld: 76 mg/dL (ref 70–99)
Potassium: 3.2 mmol/L — ABNORMAL LOW (ref 3.5–5.1)
Sodium: 138 mmol/L (ref 135–145)
Total Bilirubin: 1.4 mg/dL — ABNORMAL HIGH (ref 0.3–1.2)
Total Protein: 5.2 g/dL — ABNORMAL LOW (ref 6.5–8.1)

## 2020-11-05 LAB — URINALYSIS, COMPLETE (UACMP) WITH MICROSCOPIC
Bilirubin Urine: NEGATIVE
Glucose, UA: NEGATIVE mg/dL
Hgb urine dipstick: NEGATIVE
Ketones, ur: NEGATIVE mg/dL
Nitrite: NEGATIVE
Protein, ur: 30 mg/dL — AB
Specific Gravity, Urine: 1.023 (ref 1.005–1.030)
pH: 6 (ref 5.0–8.0)

## 2020-11-05 LAB — CBC
HCT: 28.5 % — ABNORMAL LOW (ref 36.0–46.0)
Hemoglobin: 10.2 g/dL — ABNORMAL LOW (ref 12.0–15.0)
MCH: 31.9 pg (ref 26.0–34.0)
MCHC: 35.8 g/dL (ref 30.0–36.0)
MCV: 89.1 fL (ref 80.0–100.0)
Platelets: 136 10*3/uL — ABNORMAL LOW (ref 150–400)
RBC: 3.2 MIL/uL — ABNORMAL LOW (ref 3.87–5.11)
RDW: 13.6 % (ref 11.5–15.5)
WBC: 4.8 10*3/uL (ref 4.0–10.5)
nRBC: 0 % (ref 0.0–0.2)

## 2020-11-05 LAB — D-DIMER, QUANTITATIVE: D-Dimer, Quant: 2.09 ug/mL-FEU — ABNORMAL HIGH (ref 0.00–0.50)

## 2020-11-05 LAB — SODIUM, URINE, RANDOM: Sodium, Ur: 40 mmol/L

## 2020-11-05 LAB — C-REACTIVE PROTEIN: CRP: 1.9 mg/dL — ABNORMAL HIGH (ref <1.0)

## 2020-11-05 LAB — CREATININE, URINE, RANDOM: Creatinine, Urine: 197 mg/dL

## 2020-11-05 MED ORDER — POTASSIUM CHLORIDE CRYS ER 20 MEQ PO TBCR
40.0000 meq | EXTENDED_RELEASE_TABLET | Freq: Once | ORAL | Status: AC
  Administered 2020-11-05: 40 meq via ORAL
  Filled 2020-11-05: qty 2

## 2020-11-05 NOTE — Evaluation (Addendum)
Occupational Therapy Evaluation Patient Details Name: Carol Kent MRN: [id number] DOB: [birth year] Today's Date: 11/05/2020    History of Present Illness Carol Kent is a 80 y.o. female presenting to the ED due to nausea, dry heaving, generalized weakness poor appetite and diarrhea for the past week, and testing positive for COVID 19. PMHx includes rheumatoid arthritis, brain aneurysm, chronic kidney disease stage IV, hypertension.   Clinical Impression   Pt presents with generalized weakness and reduced endurance this AM, but reporting that her nausea has dissipated, that she has not had any diarrhea or pain today, and is eager to go home. She is able to perform bed mobility, transfers, grooming, dressing, and w/in room ambulation without AD, no physical assistance required. She does, however, have minor LOB with ambulation and frequently holds onto bed railing or other furniture for [place]. Recommend HHOT to address balance issues, to improve safety and fxl mobility. Pt was reluctant to agree to this, but stated that she would "consider it." Daughter present in room t/o session and agreed that pt could benefit from [facility].     Follow Up Recommendations  Home health OT    Equipment Recommendations  None recommended by OT    Recommendations for Other Services       Precautions / Restrictions Precautions Precaution Comments: Mod Fall Risk. Airborne/droplet/contact precautions Restrictions Weight Bearing Restrictions: No      Mobility Bed Mobility Overal bed mobility: Independent                  Transfers Overall transfer level: Modified independent               General transfer comment: Hangs on to bed rails with transfers    Balance Overall balance assessment: Mild deficits observed, not formally tested;Modified Independent (minor LOB with ambulation, "furniture walking")                                         ADL either performed  or assessed with clinical judgement   ADL Overall ADL's : Modified independent                                             Vision Patient Visual Report: No change from baseline       Perception     Praxis      Pertinent Vitals/Pain Pain Assessment: No/denies pain     Hand Dominance     Extremity/Trunk Assessment Upper Extremity Assessment Upper Extremity Assessment: Overall WFL for tasks assessed   Lower Extremity Assessment Lower Extremity Assessment: Overall WFL for tasks assessed       Communication Communication Communication: No difficulties   Cognition Arousal/Alertness: Awake/alert Behavior During Therapy: WFL for tasks assessed/performed Overall Cognitive Status: Within Functional Limits for tasks assessed                                 General Comments: A&O x 4   General Comments  bruising on calves, b/l LE    Exercises Other Exercises Other Exercises: Bed mobility, transfers, w/in room ambulation, self-feeding, LB dressing, grooming. Discussion re: DC recs   Shoulder Instructions      Home Living Family/patient expects to be discharged  to:: Private residence Living Arrangements: Alone Available Help at Discharge: Family (daughter lives 2 blocks away) Type of Home: House Home Access: Stairs to enter Technical brewer of Steps: 9 Entrance Stairs-Rails: Right;Left;Can reach both [place]: One level     Bathroom Shower/Tub: Teacher, early years/pre: Standard     Home Equipment: Bedside commode          Prior Functioning/Environment Level of Independence: Independent        Comments: Walks without AD; drives; does own shopping, cooking, cleaning. Does do some "furntiure walking." Reports no recent falls.        OT Problem List: Decreased strength;Impaired balance (sitting and/or standing);Decreased activity tolerance;Decreased knowledge of use of DME or AE      OT  Treatment/Interventions:      OT Goals(Current goals can be found in the care plan section) Acute Rehab OT Goals Patient Stated Goal: to go home tomorrow OT Goal Formulation: With patient Time For Goal Achievement: 11/19/20 Potential to Achieve Goals: Good  OT Frequency:     Barriers to D/C:            Co-evaluation              AM-PAC OT "6 Clicks" Daily Activity     Outcome Measure Help from another person eating meals?: None Help from another person taking care of personal grooming?: None Help from another person toileting, which includes using toliet, bedpan, or urinal?: None Help from another person bathing (including washing, rinsing, drying)?: None Help from another person to put on and taking off regular upper body clothing?: None Help from another person to put on and taking off regular lower body clothing?: None 6 Click Score: 24   End of Session    Activity Tolerance: Patient tolerated treatment well Patient left: in bed;with call bell/phone within reach;with family/visitor present  OT Visit Diagnosis: Unsteadiness on feet (R26.81);Muscle weakness (generalized) (M62.81)                Time: [id number] OT Time Calculation (min): 20 min Charges:  OT General Charges $OT Visit: 1 Visit OT Evaluation $OT Eval Low Complexity: 1 Low OT Treatments $Self Care/Home Management : 8-22 mins Josiah Lobo, PhD, MS, OTR/L 11/05/20, 11:35 AM

## 2020-11-05 NOTE — TOC Initial Note (Signed)
Transition of Care [facility]) - Initial/Assessment Note    Patient Details  Name: Carol Kent MRN: [id number] Date of Birth: [birth year]  [facility] CM/SW Contact:    Carol Hutching, RN Phone Number: 11/05/2020, 1:48 PM  Clinical Narrative:                 Patient admitted to the hospital with acute kidney injury, tested positive for COVID.  Patient is on airborne precautions, RNCM was able to speak with patient's daughter outside of the patient room.  Patient lives alone and is independent and drives at home.  Patient has had home health services in the past with [facility], daughter thinks patient will benefit from [facility] services again once discharged.  Carol Kent with [facility] has accepted referral for PT and OT.    Daughter lives close to patient and can check on her regularly once discharged.  Patient is current with PCP Dr. Kary Kent.  [facility] [address] [facility] [place].     Expected Discharge Plan: [place] Barriers to Discharge: Continued Medical Work up   Patient Goals and CMS Choice Patient states their goals for this hospitalization and ongoing recovery are:: [name] agrees with home health services at discharge- has used [facility] in the past CMS Medicare.gov Compare Post Acute Care list provided to:: Patient Represenative (must comment) Choice offered to / list presented to : Adult Children  Expected Discharge Plan and Services Expected Discharge Plan: [name]   Discharge Planning Services: CM Consult Post Acute Care Choice: [name] arrangements for the past 2 months: Single Family Home                 DME Arranged: N/A DME Agency: NA       HH Arranged: PT, OT HH Agency: [name] ([facility]) Date HH Agency Contacted: 11/05/20 Time [name]: 1347 Representative spoke with at [place]: Carol Kent Arrangements/Services Living arrangements for the past 2 months:  [name] with:: Self Patient language and need for interpreter reviewed:: Yes Do you feel safe going back to the place where you live?: Yes      Need for Family Participation in Patient Care: Yes (Comment) (COVID) Care giver support system in place?: Yes (comment) (daughters)   Criminal Activity/Legal Involvement Pertinent to Current Situation/Hospitalization: No - Comment as needed  Activities of Daily Living Home Assistive Devices/Equipment: None ADL Screening (condition at time of admission) Patient's cognitive ability adequate to safely complete daily activities?: Yes Is the patient deaf or have difficulty hearing?: No Does the patient have difficulty seeing, even when wearing glasses/contacts?: No Does the patient have difficulty concentrating, remembering, or making decisions?: No Patient able to express need for assistance with ADLs?: Yes Does the patient have difficulty dressing or bathing?: No Independently performs ADLs?: Yes (appropriate for developmental age) Does the patient have difficulty walking or climbing stairs?: No Weakness of Legs: Both Weakness of Arms/Hands: None  Permission Sought/Granted Permission sought to share information with : Case Manager, Family Supports, Other (comment) Permission granted to share information with : Yes, Verbal Permission Granted  Share Information with NAME: Carol Kent and Carol Kent  Permission granted to share info w AGENCY: [facility]  Permission granted to share info w Relationship: daughters     Emotional Assessment       Orientation: : Oriented to Self, Oriented to Place, Oriented to  Time, Oriented to Situation Alcohol / Substance  Use: Not Applicable Psych Involvement: No (comment)  Admission diagnosis:  AKI (acute kidney injury) (Carol Kent) [N17.9] Atrial fibrillation with RVR (Carol Kent) [I48.91] Patient Active Problem List   Diagnosis Date Noted   AKI (acute kidney injury) (Carol Kent) 11/04/2020   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

## 2020-11-05 NOTE — Evaluation (Signed)
Physical Therapy Evaluation Patient Details Name: Carol Kent MRN: [id number] DOB: [birth year] Today's Date: 11/05/2020   History of Present Illness  Pt is an 80 y.o. female presenting to hospital 8/7 with nausea, diarrhea, cough, and global weakness.  Syncopal episode vs seizure in ED triage.  Pt noted with elevated HR in 150's with a-fib with RVR. (+)COVID-19 8/7.  Pt admitted with intractable nausea and diarrhea secondary to COVID-19 viral gastroenteritis and new onset paroxysmal a-fib with RVR.  PMH includes brain aneurysm, h/o cervical fx, rheumatoid arthritis, hypokalemia, AKI superimposed on CKD, pancytopenia, anemia.  Clinical Impression  Prior to hospital admission, pt was independent with ambulation (held onto furniture if needed when walking); lives alone in 1 level home with 9 STE B railings; daughter lives nearby.  Currently pt is modified independent with bed mobility; modified independent with transfers; and SBA ambulating 120 feet (no AD) in room.  Pt appearing more cautious with mobility with decreased cadence, decreased B LE step length, and mild decreased BOS.  No loss of balance noted during session but pt appearing with generalized weakness and decreased activity tolerance.  HR 76-98 bpm and O2 sats 92% on room air during sessions activities.  Pt would benefit from skilled PT to address noted impairments and functional limitations (see below for any additional details).  Upon hospital discharge, pt would benefit from [place].    Follow Up Recommendations Home health PT    Equipment Recommendations  None recommended by PT    Recommendations for Other Services       Precautions / Restrictions Precautions Precautions: Fall Restrictions Weight Bearing Restrictions: No      Mobility  Bed Mobility Overal bed mobility: Modified Independent             General bed mobility comments: HOB elevated    Transfers Overall transfer level: Modified independent Equipment  used: None             General transfer comment: UE support on bed rail for transfers  Ambulation/Gait Ambulation/Gait assistance: Supervision Gait Distance (Feet): 120 Feet Assistive device: None   Gait velocity: decreased   General Gait Details: partial step through gait pattern; mild decreased BOS  Stairs            Wheelchair Mobility    Modified Rankin (Stroke Patients Only)       Balance Overall balance assessment: Needs assistance Sitting-balance support: No upper extremity supported;Feet supported Sitting balance-Leahy Scale: Normal Sitting balance - Comments: steady sitting reaching outside BOS   Standing balance support: No upper extremity supported;During functional activity Standing balance-Leahy Scale: Good Standing balance comment: no loss of balance with ambulation but pt appearing more cautious with mobility                             Pertinent Vitals/Pain Pain Assessment: No/denies pain    Home Living Family/patient expects to be discharged to:: Private residence Living Arrangements: Alone Available Help at Discharge: Family (daughter (who works for EMS) lives about 1 mile away) Type of Home: House Home Access: Stairs to enter Entrance Stairs-Rails: Right;Left;Can reach both Technical brewer of Steps: 9 Home Layout: One level Home Equipment: Bedside commode      Prior Function Level of Independence: Independent         Comments: Will occasionally hold onto furniture as needed when walking; reports no recent falls     Hand Dominance  Extremity/Trunk Assessment   Upper Extremity Assessment Upper Extremity Assessment: Defer to OT evaluation    Lower Extremity Assessment Lower Extremity Assessment: Generalized weakness    Cervical / Trunk Assessment Cervical / Trunk Assessment: Normal  Communication   Communication: No difficulties  Cognition Arousal/Alertness: Awake/alert Behavior During  Therapy: WFL for tasks assessed/performed Overall Cognitive Status: Within Functional Limits for tasks assessed                                 General Comments: A&O x 4      General Comments General comments (skin integrity, edema, etc.): bruising on calves, b/l LE.  Nursing cleared pt for participation in physical therapy.  Pt agreeable to PT session.  Pt's daughter present during session.    Exercises    Assessment/Plan    PT Assessment Patient needs continued PT services  PT Problem List Decreased strength;Decreased activity tolerance;Decreased balance;Decreased mobility;Decreased knowledge of use of DME       PT Treatment Interventions DME instruction;Gait training;Stair training;Functional mobility training;Therapeutic activities;Therapeutic exercise;Balance training;Patient/family education    PT Goals (Current goals can be found in the Care Plan section)  Acute Rehab PT Goals Patient Stated Goal: to go home PT Goal Formulation: With patient Time For Goal Achievement: 11/19/20 Potential to Achieve Goals: Good    Frequency Min 2X/week   Barriers to discharge        Co-evaluation               AM-PAC PT "6 Clicks" Mobility  Outcome Measure Help needed turning from your back to your side while in a flat bed without using bedrails?: None Help needed moving from lying on your back to sitting on the side of a flat bed without using bedrails?: None Help needed moving to and from a bed to a chair (including a wheelchair)?: None Help needed standing up from a chair using your arms (e.g., wheelchair or bedside chair)?: None Help needed to walk in hospital room?: A Little Help needed climbing 3-5 steps with a railing? : A Little 6 Click Score: 22    End of Session Equipment Utilized During Treatment: Gait belt Activity Tolerance: Patient tolerated treatment well Patient left: in bed;with call bell/phone within reach;with family/visitor present;Other  (comment) (pt's daughter reports bed alarm off when she is present with pt (discussed with pt's nurse)) Nurse Communication: Mobility status;Precautions;Other (comment) (bed alarm off) PT Visit Diagnosis: Muscle weakness (generalized) (M62.81);Other abnormalities of gait and mobility (R26.89)    Time: 1045-1105 PT Time Calculation (min) (ACUTE ONLY): 20 min   Charges:   PT Evaluation $PT Eval Low Complexity: 1 Low PT Treatments $Therapeutic Exercise: 8-22 mins       Leitha Bleak, PT 11/05/20, 1:46 PM

## 2020-11-05 NOTE — Progress Notes (Signed)
PROGRESS NOTE                                                                                                                                                                                                             Patient Demographics:    Carol Kent, is a 80 y.o. female, DOB - 1940-12-12, [id number]  Outpatient Primary MD for the patient is [place] Pink, MD   Admit date - 11/04/2020   LOS - 1  Chief Complaint  Patient presents with   Chest Pain   Emesis       Brief Narrative: Patient is a 80 y.o. female with PMHx of RA, CKD stage IV, HTN, history of brain aneurysm-s/p craniotomy-who presented with weakness, diarrhea-patient was found to have AKI-and COVID-19 infection along with transient A. fib with RVR.  See below for further details.   COVID-19 vaccinated status: Unvaccinated  Significant Events: 8/7>> Admit to [facility]-A. fib RVR (transient)-AKI-diarrhea-COVID-19 infection.  Significant studies: 8/7>>Chest x-ray: No pneumonia/consolidation. 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  Colonic diverticulosis without diverticulitis.  COVID-19 medications: Remdesivir: 8/7>>  Antibiotics: None  Microbiology data: 8/8 >>blood culture: Pending  Procedures: None  Consults: None  DVT prophylaxis: heparin injection 5,000 Units Start: 11/04/20 1700     Subjective:    Carol Kent today feels better than yesterday-diarrhea has resolved.   Assessment  & Plan :   Gastroenteritis due to COVID-19: Seems to have resolved with supportive care.  Continue to monitor closely.  COVID-19 infection: No pneumonia on CXR-not hypoxic-plan on Remdesivir x3 days  AKI on CKD stage IV: AKI hemodynamically mediated-continue supportive care-avoid  nephrotoxic agents-creatinine slowly improving-recheck electrolytes on 8/9.  Hypokalemia: Replete and recheck-likely due to GI loss  PAF with RVR: Very transient-occurred briefly in the ED-continue telemetry monitoring-await echo.  Given that this episode was very brief-she has had a ruptured aneurysm-requiring craniotomy-avoid anticoagulation for now.  CHA2DS2-VASc of at least 3.  Syncope: Occurred while in the emergency room-high suspicion that this is probably related to either orthostatic mechanism or vasovagal.  Continue telemetry monitoring-await echo.  Right lower chest pain: Probably from coughing-EKG/troponins negative.  Watch closely.  Elevated D-dimer: Check lower extremity Dopplers and rule out DVT-given underlying COVID-19 infection.  HTN: BP stable without the use  of any antihypertensive-continue to hold Maxide.  Rheumatoid arthritis: No flare-on chronic prednisone/Plaquenil.  No results for input(s): PROCALCITON in the last 168 hours.  Lab Results  Component Value Date   SARSCOV2NAA POSITIVE (A) 11/04/2020   [place] NEGATIVE 11/13/2019     ABG: No results found for: PHART, PCO2ART, PO2ART, HCO3, TCO2, ACIDBASEDEF, O2SAT  Vent Settings: N/A  Condition - Stable  Family Communication  :  Daughter updated at bedside.  Code Status :  Full Code  Diet :  Diet Order             Diet Heart Room service appropriate? Yes; Fluid consistency: Thin  Diet effective now                    Disposition Plan  :   Status is: Inpatient  Remains inpatient appropriate because:Inpatient level of care appropriate due to severity of illness  Dispo: The patient is from: Home              Anticipated d/c is to: Home              Patient currently is not medically stable to d/c.   Difficult to place patient No    Barriers to discharge: Resolving AKI (not yet at baseline) on IVF-COVID-19 infection needs Remdesivir x3 days. Antimicorbials  :    Anti-infectives (From  admission, onward)    Start     Dose/Rate Route Frequency Ordered Stop   11/05/20 1000  remdesivir 100 mg in sodium chloride 0.9 % 100 mL IVPB       See Hyperspace for full Linked Orders Report.   100 mg 200 mL/hr over 30 Minutes Intravenous Daily 11/04/20 1442 11/09/20 0959   11/04/20 2200  hydroxychloroquine (PLAQUENIL) tablet 200 mg        200 mg Oral 2 times daily 11/04/20 1937     11/04/20 1600  remdesivir 200 mg in sodium chloride 0.9% 250 mL IVPB       See Hyperspace for full Linked Orders Report.   200 mg 580 mL/hr over 30 Minutes Intravenous Once 11/04/20 1442 11/04/20 2300       Inpatient Medications  Scheduled Meds:  vitamin C  500 mg Oral Daily   heparin  5,000 Units Subcutaneous Q8H   hydroxychloroquine  200 mg Oral BID   potassium chloride  10 mEq Oral Daily   predniSONE  5 mg Oral Daily   cyanocobalamin  1,000 mcg Oral Daily   zinc sulfate  220 mg Oral Daily   Continuous Infusions:  lactated ringers 75 mL/hr at 11/05/20 [id number]   remdesivir 100 mg in NS 100 mL 100 mg (11/05/20 0830)   PRN Meds:.acetaminophen, guaiFENesin-dextromethorphan, HYDROcodone-acetaminophen, loperamide, ondansetron (ZOFRAN) IV, polyethylene glycol, traZODone   Time Spent in minutes  25    See all Orders from today for further details   Oren Binet M.D on 11/05/2020 at 11:46 AM  To page go to [email or web address] - use universal password  [facility] -  Office  [phone]    Objective:   Vitals:   11/05/20 0013 11/05/20 0554 11/05/20 0847 11/05/20 1131  BP: (!) 103/45 (!) 113/55 (!) 119/46 (!) 104/53  Pulse: 78 75 79 84  Resp: '16 18 16 16  '$ Temp: 98.2 F (36.8 C) 97.8 F (36.6 C) 97.8 F (36.6 C) 98.2 F (36.8 C)  TempSrc: Oral Oral    SpO2: 94% 98% 96% 100%  Weight:      Height:  Wt Readings from Last 3 Encounters:  11/04/20 70 kg  09/29/20 70.8 kg  07/16/20 71.7 kg     Intake/Output Summary (Last 24 hours) at 11/05/2020 1146 Last data filed at  11/04/2020 2300 Gross per 24 hour  Intake 1772.5 ml  Output --  Net 1772.5 ml     Physical Exam Gen Exam:Alert awake-not in any distress HEENT:atraumatic, normocephalic Chest: B/L clear to auscultation anteriorly CVS:S1S2 regular Abdomen:soft non tender, non distended Extremities:no edema Neurology: Non focal Skin: no rash   Data Review:    CBC Recent Labs  Lab 11/04/20 1310 11/05/20 0538  WBC 5.9 4.8  HGB 11.3* 10.2*  HCT 32.6* 28.5*  PLT 145* 136*  MCV 87.6 89.1  MCH 30.4 31.9  MCHC 34.7 35.8  RDW 13.5 13.6  LYMPHSABS 0.9  --   MONOABS 0.6  --   EOSABS 0.0  --   BASOSABS 0.0  --     Chemistries  Recent Labs  Lab 11/04/20 1029 11/05/20 0538  NA 137 138  K 3.9 3.2*  CL 105 105  CO2 19* 24  GLUCOSE 118* 76  BUN 28* 27*  CREATININE 2.56* 2.16*  CALCIUM 8.9 7.8*  MG 1.8  --   AST 26 58*  ALT 12 19  ALKPHOS 59 74  BILITOT 1.4* 1.4*   ------------------------------------------------------------------------------------------------------------------ No results for input(s): CHOL, HDL, LDLCALC, TRIG, CHOLHDL, LDLDIRECT in the last 72 hours.  Lab Results  Component Value Date   HGBA1C 5.0 11/13/2019   ------------------------------------------------------------------------------------------------------------------ No results for input(s): TSH, T4TOTAL, T3FREE, THYROIDAB in the last 72 hours.  Invalid input(s): FREET3 ------------------------------------------------------------------------------------------------------------------ No results for input(s): VITAMINB12, FOLATE, FERRITIN, TIBC, IRON, RETICCTPCT in the last 72 hours.  Coagulation profile No results for input(s): INR, PROTIME in the last 168 hours.  Recent Labs    11/05/20 0750  DDIMER 2.09*    Cardiac Enzymes No results for input(s): CKMB, TROPONINI, MYOGLOBIN in the last 168 hours.  Invalid input(s):  CK ------------------------------------------------------------------------------------------------------------------ No results found for: BNP  Micro Results Recent Results (from the past 240 hour(s))  Resp Panel by RT-PCR (Flu A&B, Covid) Nasopharyngeal Swab     Status: Abnormal   Collection Time: 11/04/20 10:29 AM   Specimen: Nasopharyngeal Swab; Nasopharyngeal(NP) swabs in vial transport medium  Result Value Ref Range Status   SARS Coronavirus 2 by RT PCR POSITIVE (A) NEGATIVE Final    Comment: RESULT CALLED TO, READ BACK BY AND VERIFIED WITH: KELLY PENDLETON AT 1227 11/04/20.PMF (NOTE) SARS-CoV-2 target nucleic acids are DETECTED.  The SARS-CoV-2 RNA is generally detectable in upper respiratory specimens during the acute phase of infection. Positive results are indicative of the presence of the identified virus, but do not rule out bacterial infection or co-infection with other pathogens not detected by the test. Clinical correlation with patient history and other diagnostic information is necessary to determine patient infection status. The expected result is Negative.  Fact Sheet for Patients: EntrepreneurPulse.com.au  Fact Sheet for Healthcare Providers: IncredibleEmployment.be  This test is not yet approved or cleared by the [place] FDA and  has been authorized for detection and/or diagnosis of SARS-CoV-2 by FDA under an Emergency Use Authorization (EUA).  This EUA will remain in effect (meaning this test can b e used) for the duration of  the COVID-19 declaration under Section 564(b)(1) of the Act, 21 U.S.C. section 360bbb-3(b)(1), unless the authorization is terminated or revoked sooner.     Influenza A by PCR NEGATIVE NEGATIVE Final   Influenza B by  PCR NEGATIVE NEGATIVE Final    Comment: (NOTE) The Xpert Xpress SARS-CoV-2/FLU/RSV plus assay is intended as an aid in the diagnosis of influenza from Nasopharyngeal swab  specimens and should not be used as a sole basis for treatment. Nasal washings and aspirates are unacceptable for Xpert Xpress SARS-CoV-2/FLU/RSV testing.  Fact Sheet for Patients: EntrepreneurPulse.com.au  Fact Sheet for Healthcare Providers: IncredibleEmployment.be  This test is not yet approved or cleared by the [place] FDA and has been authorized for detection and/or diagnosis of SARS-CoV-2 by FDA under an Emergency Use Authorization (EUA). This EUA will remain in effect (meaning this test can be used) for the duration of the COVID-19 declaration under Section 564(b)(1) of the Act, 21 U.S.C. section 360bbb-3(b)(1), unless the authorization is terminated or revoked.  Performed at [facility], [address], [place], [place] [address]     Radiology Reports CT ABDOMEN PELVIS WO CONTRAST  Result Date: 11/04/2020 CLINICAL DATA:  Abdominal pain and fever. Nausea, vomiting, diarrhea. EXAM: CT ABDOMEN AND PELVIS WITHOUT CONTRAST TECHNIQUE: Multidetector CT imaging of the abdomen and pelvis was performed following the standard protocol without IV contrast. COMPARISON:  11/13/2019 FINDINGS: Lower chest: Lungs are hyperinflated. Coarse interstitial markings identified at the bases, consistent with scarring. No pleural effusions. Coronary artery calcifications are present. Hepatobiliary: Cholecystectomy. Liver is homogeneous. No focal mass. Pancreas: Unremarkable. No pancreatic ductal dilatation or surrounding inflammatory changes. Spleen: Normal in size without focal abnormality. Adrenals/Urinary Tract: Adrenal glands are normal. There is bilateral renal parenchymal thinning. No intrarenal calculi or mass. Ureters are unremarkable. The bladder and visualized portion of the urethra are normal. Stomach/Bowel: Small hiatal hernia. Stomach is otherwise normal. Small bowel loops are normal in appearance. There is fluid in the descending colon,  consistent with history of diarrhea. Numerous colonic diverticula are present, vertically within the sigmoid colon. No associated inflammatory changes to indicate acute diverticulitis. The appendix is well seen and has a normal appearance. Vascular/Lymphatic: There is atherosclerotic calcification of the abdominal aorta, not associated with aneurysm. No retroperitoneal or mesenteric adenopathy. Reproductive: Uterus is present. No adnexal mass or free pelvic fluid. Other: Abdominal wall is unremarkable. Musculoskeletal: Degenerative changes in the lumbar spine. No acute abnormality. IMPRESSION: 1. Fluid within the colon, consistent with known diarrhea. 2. Colonic diverticulosis without acute diverticulitis. 3. Cholecystectomy. 4. Bilateral renal parenchymal thinning. No acute urinary tract abnormality. 5. Normal appendix. 6.  Aortic atherosclerosis.  (ICD10-I70.0) Electronically Signed   By: Nolon Nations M.D.   On: 11/04/2020 12:18   CT HEAD WO CONTRAST (5MM)  Result Date: 11/04/2020 CLINICAL DATA:  Altered mental status.  Nausea, vomiting, diarrhea. EXAM: CT HEAD WITHOUT CONTRAST TECHNIQUE: Contiguous axial images were obtained from the base of the skull through the vertex without intravenous contrast. COMPARISON:  None. FINDINGS: Brain: Status post right temporal craniotomy. 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 Mild diffuse parenchymal volume loss, commensurate with the patient's age. Mild periventricular white matter changes are present likely reflecting the sequela of small vessel ischemia. No evidence of acute intracranial hemorrhage or infarct. No abnormal mass effect or midline shift. No abnormal intra or extra-axial mass lesion. Ventricular size is normal. Cerebellum is unremarkable. Vascular: No asymmetric hyperdense vasculature at the skull base. Skull: No acute fracture Sinuses/Orbits: The orbits are unremarkable. The paranasal sinuses are clear. Other:  Mastoid air cells and middle ear cavities are clear. 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 Stable mild senescent change. No  evidence of acute intracranial hemorrhage or infarct. Electronically Signed   By: Fidela Salisbury MD   On: 11/04/2020 12:14   DG Chest Portable 1 View  Result Date: 11/04/2020 CLINICAL DATA:  80 year old female with history of chest pain and shortness of breath for 1 week. EXAM: PORTABLE CHEST 1 VIEW COMPARISON:  Chest x-ray 06/07/2020. FINDINGS: Defibrillator pads are noted projecting over the left hemithorax. Lung volumes are normal. No acute consolidative airspace disease. Diffuse peribronchial cuffing and widespread areas of interstitial prominence. No pneumothorax. Chronic bilateral apical pleuroparenchymal thickening and architectural distortion, similar to prior study, most compatible with chronic post infectious or inflammatory scarring. No definite evidence of pulmonary edema. Heart size is mildly enlarged. Upper mediastinal contours are within normal limits. Atherosclerosis in the thoracic aorta. Surgical clips project over the right upper quadrant of the abdomen, likely from prior cholecystectomy. IMPRESSION: 1. The appearance the chest is concerning for an acute bronchitis, as above. 2. Mild cardiomegaly. 3. Aortic atherosclerosis. Electronically Signed   By: Vinnie Langton M.D.   On: 11/04/2020 11:46

## 2020-11-06 ENCOUNTER — Inpatient Hospital Stay (HOSPITAL_COMMUNITY)
Admit: 2020-11-06 | Discharge: 2020-11-06 | Disposition: A | Discharge status: Home / Self Care | Payer: Medicare Other | Attending: Internal Medicine | Admitting: Internal Medicine

## 2020-11-06 DIAGNOSIS — I4891 Unspecified atrial fibrillation: Secondary | ICD-10-CM | POA: Diagnosis not present

## 2020-11-06 LAB — COMPREHENSIVE METABOLIC PANEL
ALT: 26 U/L (ref 0–44)
AST: 36 U/L (ref 15–41)
Albumin: 2.7 g/dL — ABNORMAL LOW (ref 3.5–5.0)
Alkaline Phosphatase: 94 U/L (ref 38–126)
Anion gap: 9 (ref 5–15)
BUN: 32 mg/dL — ABNORMAL HIGH (ref 8–23)
CO2: 23 mmol/L (ref 22–32)
Calcium: 7.9 mg/dL — ABNORMAL LOW (ref 8.9–10.3)
Chloride: 107 mmol/L (ref 98–111)
Creatinine, Ser: 2.05 mg/dL — ABNORMAL HIGH (ref 0.44–1.00)
GFR, Estimated: 24 mL/min — ABNORMAL LOW (ref >60)
Glucose, Bld: 80 mg/dL (ref 70–99)
Potassium: 3.5 mmol/L (ref 3.5–5.1)
Sodium: 139 mmol/L (ref 135–145)
Total Bilirubin: 0.9 mg/dL (ref 0.3–1.2)
Total Protein: 5.2 g/dL — ABNORMAL LOW (ref 6.5–8.1)

## 2020-11-06 LAB — CBC
HCT: 28.9 % — ABNORMAL LOW (ref 36.0–46.0)
Hemoglobin: 10.1 g/dL — ABNORMAL LOW (ref 12.0–15.0)
MCH: 30.5 pg (ref 26.0–34.0)
MCHC: 34.9 g/dL (ref 30.0–36.0)
MCV: 87.3 fL (ref 80.0–100.0)
Platelets: 143 10*3/uL — ABNORMAL LOW (ref 150–400)
RBC: 3.31 MIL/uL — ABNORMAL LOW (ref 3.87–5.11)
RDW: 13.8 % (ref 11.5–15.5)
WBC: 4.4 10*3/uL (ref 4.0–10.5)
nRBC: 0 % (ref 0.0–0.2)

## 2020-11-06 LAB — ECHOCARDIOGRAM COMPLETE
AR max vel: 2 cm2
AV Area VTI: 2.35 cm2
AV Area mean vel: 2.1 cm2
AV Mean grad: 4 mmHg
AV Peak grad: 6.9 mmHg
Ao pk vel: 1.31 m/s
Area-P 1/2: 3.76 cm2
Height: 63 in
S' Lateral: 2.14 cm
Weight: 2469.15 oz

## 2020-11-06 LAB — C-REACTIVE PROTEIN: CRP: 4 mg/dL — ABNORMAL HIGH (ref <1.0)

## 2020-11-06 LAB — MAGNESIUM: Magnesium: 1.4 mg/dL — ABNORMAL LOW (ref 1.7–2.4)

## 2020-11-06 LAB — D-DIMER, QUANTITATIVE: D-Dimer, Quant: 1.83 ug/mL-FEU — ABNORMAL HIGH (ref 0.00–0.50)

## 2020-11-06 MED ORDER — MAGNESIUM SULFATE 4 GM/100ML IV SOLN
4.0000 g | Freq: Once | INTRAVENOUS | Status: AC
  Administered 2020-11-06: 14:00:00 4 g via INTRAVENOUS
  Filled 2020-11-06 (×2): qty 100

## 2020-11-06 NOTE — Progress Notes (Signed)
[name] concerned about left jaw tenderness and swelling. No obvious signs of trauma. Will pass only information to MD.

## 2020-11-06 NOTE — Progress Notes (Signed)
*  PRELIMINARY RESULTS* Echocardiogram 2D Echocardiogram has been performed.  Sherrie Sport 11/06/2020, 8:39 AM

## 2020-11-06 NOTE — Progress Notes (Signed)
PROGRESS NOTE                                                                                                                                                                                                             Patient Demographics:    Carol Kent, is a 80 y.o. female, DOB - 20-Sep-1940, EC:[id number]  Outpatient Primary MD for the patient is [place] Pink, MD   Admit date - 11/04/2020   LOS - 2  Chief Complaint  Patient presents with   Chest Pain   Emesis       Brief Narrative: Patient is a 80 y.o. female with PMHx of RA, CKD stage IV, HTN, history of brain aneurysm-s/p craniotomy-who presented with weakness, diarrhea-patient was found to have AKI-and COVID-19 infection along with transient A. fib with RVR.  See below for further details.   COVID-19 vaccinated status: Unvaccinated  Significant Events: 8/7>> Admit to [facility]-A. fib RVR (transient)-AKI-diarrhea-COVID-19 infection.  Significant studies: 8/7>>Chest x-ray: No pneumonia/consolidation. 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  Colonic diverticulosis without diverticulitis. 8/8>> bilateral lower extremity Doppler: No DVT. 8/9>> Echo: EF [id number], grade 2 diastolic dysfunction  [id number] medications: Remdesivir: 8/7>>  Antibiotics: None  Microbiology data: 8/8 >>blood culture: No growth  Procedures: None  Consults: None  DVT prophylaxis: heparin injection 5,000 Units Start: 11/04/20 1700     Subjective:   Lying comfortably in bed-no diarrhea.   Assessment  & Plan :   Gastroenteritis due to COVID-19: Seems to have resolved with supportive care.  Continue to monitor closely.  COVID-19 infection: No pneumonia on CXR-not hypoxic-plan on Remdesivir x3 days  AKI on CKD  stage IV: AKI hemodynamically mediated-continue supportive care-avoid nephrotoxic agents-creatinine slowly improving-not yet at baseline-continue supportive care-appears euvolemic on exam-repeat electrolytes on 8/10.    Hypokalemia: Repleted.  Hypomagnesemia: Replete and recheck.  PAF with RVR: Very transient-occurred briefly in the ED-telemetry monitoring currently negative for A. fib-echo with preserved EF.  Given that this episode was very brief-she has had a ruptured aneurysm-requiring craniotomy-avoid anticoagulation for now (long discussion with patient/daughter at bedside-both agree).  CHA2DS2-VASc of at least 3.  Syncope: Occurred while in the emergency room-high suspicion that this is probably related to either orthostatic mechanism or vasovagal.  Continue telemetry monitoring-echo with  preserved EF.  Check orthostatic vital signs tomorrow morning.  Right lower chest pain: Probably from coughing-EKG/troponins negative.  Watch closely.  Elevated D-dimer: Due to COVID-19-downtrending-lower extremity Dopplers negative for DVT.  No hypoxia-doubt further work-up required.  On prophylactic heparin.   HTN: BP stable without the use of any antihypertensive-continue to hold Maxide.  Rheumatoid arthritis: No flare-on chronic prednisone/Plaquenil.  No results for input(s): PROCALCITON in the last 168 hours.  Lab Results  Component Value Date   SARSCOV2NAA POSITIVE (A) 11/04/2020   [place] NEGATIVE 11/13/2019      ABG: No results found for: PHART, PCO2ART, PO2ART, HCO3, TCO2, ACIDBASEDEF, O2SAT  Vent Settings: N/A  Condition - Stable  Family Communication  :  Daughter updated at bedside.  Code Status :  Full Code  Diet :  Diet Order             Diet Heart Room service appropriate? Yes; Fluid consistency: Thin  Diet effective now                    Disposition Plan  :   Status is: Inpatient  Remains inpatient appropriate because:Inpatient level of care  appropriate due to severity of illness  Dispo: The patient is from: Home              Anticipated d/c is to: Home              Patient currently is not medically stable to d/c.   Difficult to place patient No    Barriers to discharge: Resolving AKI (not yet at baseline) on IVF-COVID-19 infection needs Remdesivir x3 days. Antimicorbials  :    Anti-infectives (From admission, onward)    Start     Dose/Rate Route Frequency Ordered Stop   11/05/20 1000  remdesivir 100 mg in sodium chloride 0.9 % 100 mL IVPB       See Hyperspace for full Linked Orders Report.   100 mg 200 mL/hr over 30 Minutes Intravenous Daily 11/04/20 1442 11/06/20 1041   11/04/20 2200  hydroxychloroquine (PLAQUENIL) tablet 200 mg        200 mg Oral 2 times daily 11/04/20 1937     11/04/20 1600  remdesivir 200 mg in sodium chloride 0.9% 250 mL IVPB       See Hyperspace for full Linked Orders Report.   200 mg 580 mL/hr over 30 Minutes Intravenous Once 11/04/20 1442 11/04/20 2300       Inpatient Medications  Scheduled Meds:  vitamin C  500 mg Oral Daily   heparin  5,000 Units Subcutaneous Q8H   hydroxychloroquine  200 mg Oral BID   potassium chloride  10 mEq Oral Daily   predniSONE  5 mg Oral Daily   cyanocobalamin  1,000 mcg Oral Daily   zinc sulfate  220 mg Oral Daily   Continuous Infusions:  lactated ringers 75 mL/hr at 11/06/20 1044   magnesium sulfate bolus IVPB     PRN Meds:.acetaminophen, guaiFENesin-dextromethorphan, HYDROcodone-acetaminophen, loperamide, ondansetron (ZOFRAN) IV, polyethylene glycol, traZODone   Time Spent in minutes  25    See all Orders from today for further details   Oren Binet M.D on 11/06/2020 at 2:15 PM  To page go to [email or web address] - use universal password  [facility] -  Office  [phone]    Objective:   Vitals:   11/06/20 0006 11/06/20 0447 11/06/20 0859 11/06/20 1114  BP: (!) 103/48 (!) 102/46 (!) 129/48 (!) 100/58  Pulse: 75 77 82 75  Resp:  '16 16 17 17  '$ Temp: 98.3 F (36.8 C) (!) 97.4 F (36.3 C) 97.7 F (36.5 C) 98.7 F (37.1 C)  TempSrc: Oral Oral Oral Oral  SpO2: 95% 96% 99% 96%  Weight:      Height:        Wt Readings from Last 3 Encounters:  11/04/20 70 kg  09/29/20 70.8 kg  07/16/20 71.7 kg     Intake/Output Summary (Last 24 hours) at 11/06/2020 1415 Last data filed at 11/05/2020 2003 Gross per 24 hour  Intake 100 ml  Output --  Net 100 ml      Physical Exam Gen Exam:Alert awake-not in any distress HEENT:atraumatic, normocephalic Chest: B/L clear to auscultation anteriorly CVS:S1S2 regular Abdomen:soft non tender, non distended Extremities:no edema Neurology: Non focal Skin: no rash    Data Review:    CBC Recent Labs  Lab 11/04/20 1310 11/05/20 0538 11/06/20 0734  WBC 5.9 4.8 4.4  HGB 11.3* 10.2* 10.1*  HCT 32.6* 28.5* 28.9*  PLT 145* 136* 143*  MCV 87.6 89.1 87.3  MCH 30.4 31.9 30.5  MCHC 34.7 35.8 34.9  RDW 13.5 13.6 13.8  LYMPHSABS 0.9  --   --   MONOABS 0.6  --   --   EOSABS 0.0  --   --   BASOSABS 0.0  --   --      Chemistries  Recent Labs  Lab 11/04/20 1029 11/05/20 0538 11/06/20 0734  NA 137 138 139  K 3.9 3.2* 3.5  CL 105 105 107  CO2 19* 24 23  GLUCOSE 118* 76 80  BUN 28* 27* 32*  CREATININE 2.56* 2.16* 2.05*  CALCIUM 8.9 7.8* 7.9*  MG 1.8  --  1.4*  AST 26 58* 36  ALT '12 19 26  '$ ALKPHOS 59 74 94  BILITOT 1.4* 1.4* 0.9    ------------------------------------------------------------------------------------------------------------------ No results for input(s): CHOL, HDL, LDLCALC, TRIG, CHOLHDL, LDLDIRECT in the last 72 hours.  Lab Results  Component Value Date   HGBA1C 5.0 11/13/2019   ------------------------------------------------------------------------------------------------------------------ No results for input(s): TSH, T4TOTAL, T3FREE, THYROIDAB in the last 72 hours.  Invalid input(s):  FREET3 ------------------------------------------------------------------------------------------------------------------ No results for input(s): VITAMINB12, FOLATE, FERRITIN, TIBC, IRON, RETICCTPCT in the last 72 hours.  Coagulation profile No results for input(s): INR, PROTIME in the last 168 hours.  Recent Labs    11/05/20 0750 11/06/20 0734  DDIMER 2.09* 1.83*     Cardiac Enzymes No results for input(s): CKMB, TROPONINI, MYOGLOBIN in the last 168 hours.  Invalid input(s): CK ------------------------------------------------------------------------------------------------------------------ No results found for: BNP  Micro Results Recent Results (from the past 240 hour(s))  Resp Panel by RT-PCR (Flu A&B, Covid) Nasopharyngeal Swab     Status: Abnormal   Collection Time: 11/04/20 10:29 AM   Specimen: Nasopharyngeal Swab; Nasopharyngeal(NP) swabs in vial transport medium  Result Value Ref Range Status   SARS Coronavirus 2 by RT PCR POSITIVE (A) NEGATIVE Final    Comment: RESULT CALLED TO, READ BACK BY AND VERIFIED WITH: KELLY PENDLETON AT 1227 11/04/20.PMF (NOTE) SARS-CoV-2 target nucleic acids are DETECTED.  The SARS-CoV-2 RNA is generally detectable in upper respiratory specimens during the acute phase of infection. Positive results are indicative of the presence of the identified virus, but do not rule out bacterial infection or co-infection with other pathogens not detected by the test. Clinical correlation with patient history and other diagnostic information is necessary to determine patient infection status. The expected result is Negative.  Fact Sheet for Patients: EntrepreneurPulse.com.au  Fact Sheet for Healthcare Providers: IncredibleEmployment.be  This test is not yet approved or cleared by the [place] FDA and  has been authorized for detection and/or diagnosis of SARS-CoV-2 by FDA under an Emergency Use  Authorization (EUA).  This EUA will remain in effect (meaning this test can b e used) for the duration of  the COVID-19 declaration under Section 564(b)(1) of the Act, 21 U.S.C. section 360bbb-3(b)(1), unless the authorization is terminated or revoked sooner.     Influenza A by PCR NEGATIVE NEGATIVE Final   Influenza B by PCR NEGATIVE NEGATIVE Final    Comment: (NOTE) The Xpert Xpress SARS-CoV-2/FLU/RSV plus assay is intended as an aid in the diagnosis of influenza from Nasopharyngeal swab specimens and should not be used as a sole basis for treatment. Nasal washings and aspirates are unacceptable for Xpert Xpress SARS-CoV-2/FLU/RSV testing.  Fact Sheet for Patients: EntrepreneurPulse.com.au  Fact Sheet for Healthcare Providers: IncredibleEmployment.be  This test is not yet approved or cleared by the [place] FDA and has been authorized for detection and/or diagnosis of SARS-CoV-2 by FDA under an Emergency Use Authorization (EUA). This EUA will remain in effect (meaning this test can be used) for the duration of the COVID-19 declaration under Section 564(b)(1) of the Act, 21 U.S.C. section 360bbb-3(b)(1), unless the authorization is terminated or revoked.  Performed at [facility], [address] [place], [place] [address]   Blood culture (routine x 2)     Status: None (Preliminary result)   Collection Time: 11/05/20  5:38 AM   Specimen: BLOOD  Result Value Ref Range Status   Specimen Description BLOOD RIGHT ARM  Final   Special Requests   Final    BOTTLES DRAWN AEROBIC AND ANAEROBIC Blood Culture adequate volume   Culture   Final    NO GROWTH < 24 HOURS Performed at [facility], [address], [place], [place] [address]    Report Status PENDING  Incomplete  Blood culture (routine x 2)     Status: None (Preliminary result)   Collection Time: 11/05/20  5:38 AM   Specimen: BLOOD  Result Value Ref Range  Status   Specimen Description BLOOD LEFT ARM  Final   Special Requests   Final    BOTTLES DRAWN AEROBIC AND ANAEROBIC Blood Culture adequate volume   Culture   Final    NO GROWTH < 24 HOURS Performed at [facility], [address] [place], [place] [address]    Report Status PENDING  Incomplete    Radiology Reports CT ABDOMEN PELVIS WO CONTRAST  Result Date: 11/04/2020 CLINICAL DATA:  Abdominal pain and fever. Nausea, vomiting, diarrhea. EXAM: CT ABDOMEN AND PELVIS WITHOUT CONTRAST TECHNIQUE: Multidetector CT imaging of the abdomen and pelvis was performed following the standard protocol without IV contrast. COMPARISON:  11/13/2019 FINDINGS: Lower chest: Lungs are hyperinflated. Coarse interstitial markings identified at the bases, consistent with scarring. No pleural effusions. Coronary artery calcifications are present. Hepatobiliary: Cholecystectomy. Liver is homogeneous. No focal mass. Pancreas: Unremarkable. No pancreatic ductal dilatation or surrounding inflammatory changes. Spleen: Normal in size without focal abnormality. Adrenals/Urinary Tract: Adrenal glands are normal. There is bilateral renal parenchymal thinning. No intrarenal calculi or mass. Ureters are unremarkable. The bladder and visualized portion of the urethra are normal. Stomach/Bowel: Small hiatal hernia. Stomach is otherwise normal. Small bowel loops are normal in appearance. There is fluid in the descending colon, consistent with history of diarrhea. Numerous colonic diverticula are present, vertically within the sigmoid colon. No  associated inflammatory changes to indicate acute diverticulitis. The appendix is well seen and has a normal appearance. Vascular/Lymphatic: There is atherosclerotic calcification of the abdominal aorta, not associated with aneurysm. No retroperitoneal or mesenteric adenopathy. Reproductive: Uterus is present. No adnexal mass or free pelvic fluid. Other: Abdominal wall is unremarkable.  Musculoskeletal: Degenerative changes in the lumbar spine. No acute abnormality. IMPRESSION: 1. Fluid within the colon, consistent with known diarrhea. 2. Colonic diverticulosis without acute diverticulitis. 3. Cholecystectomy. 4. Bilateral renal parenchymal thinning. No acute urinary tract abnormality. 5. Normal appendix. 6.  Aortic atherosclerosis.  (ICD10-I70.0) Electronically Signed   By: Nolon Nations M.D.   On: 11/04/2020 12:18   CT HEAD WO CONTRAST (5MM)  Result Date: 11/04/2020 CLINICAL DATA:  Altered mental status.  Nausea, vomiting, diarrhea. EXAM: CT HEAD WITHOUT CONTRAST TECHNIQUE: Contiguous axial images were obtained from the base of the skull through the vertex without intravenous contrast. COMPARISON:  None. FINDINGS: Brain: Status post right temporal craniotomy. 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 Mild diffuse parenchymal volume loss, commensurate with the patient's age. Mild periventricular white matter changes are present likely reflecting the sequela of small vessel ischemia. No evidence of acute intracranial hemorrhage or infarct. No abnormal mass effect or midline shift. No abnormal intra or extra-axial mass lesion. Ventricular size is normal. Cerebellum is unremarkable. Vascular: No asymmetric hyperdense vasculature at the skull base. Skull: No acute fracture Sinuses/Orbits: The orbits are unremarkable. The paranasal sinuses are clear. Other: Mastoid air cells and middle ear cavities are clear. 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 Stable mild senescent change. No evidence of acute intracranial hemorrhage or infarct. Electronically Signed   By: Fidela Salisbury MD   On: 11/04/2020 12:14   US Venous Img Lower Bilateral (DVT)  Result Date: 11/05/2020 CLINICAL DATA:  COVID positive, lower extremity swelling, elevated D-dimer EXAM: BILATERAL LOWER EXTREMITY VENOUS DOPPLER  ULTRASOUND TECHNIQUE: Gray-scale sonography with graded compression, as well as color Doppler and duplex ultrasound were performed to evaluate the lower extremity deep venous systems from the level of the common femoral vein and including the common femoral, femoral, profunda femoral, popliteal and calf veins including the posterior tibial, peroneal and gastrocnemius veins when visible. The superficial great saphenous vein was also interrogated. Spectral Doppler was utilized to evaluate flow at rest and with distal augmentation maneuvers in the common femoral, femoral and popliteal veins. COMPARISON:  None. FINDINGS: RIGHT LOWER EXTREMITY Common Femoral Vein: No evidence of thrombus. Normal compressibility, respiratory phasicity and response to augmentation. Saphenofemoral Junction: No evidence of thrombus. Normal compressibility and flow on color Doppler imaging. Profunda Femoral Vein: No evidence of thrombus. Normal compressibility and flow on color Doppler imaging. Femoral Vein: No evidence of thrombus. Normal compressibility, respiratory phasicity and response to augmentation. Popliteal Vein: No evidence of thrombus. Normal compressibility, respiratory phasicity and response to augmentation. Calf Veins: No evidence of thrombus. Normal compressibility and flow on color Doppler imaging. LEFT LOWER EXTREMITY Common Femoral Vein: No evidence of thrombus. Normal compressibility, respiratory phasicity and response to augmentation. Saphenofemoral Junction: No evidence of thrombus. Normal compressibility and flow on color Doppler imaging. Profunda Femoral Vein: No evidence of thrombus. Normal compressibility and flow on color Doppler imaging. Femoral Vein: No evidence of thrombus. Normal compressibility, respiratory phasicity and response to augmentation. Popliteal Vein: No evidence of thrombus. Normal compressibility, respiratory phasicity and response to augmentation. Calf Veins: No evidence of thrombus. Normal  compressibility and flow on color Doppler  imaging. IMPRESSION: No evidence of deep venous thrombosis in either lower extremity. Electronically Signed   By: Jerilynn Mages.  Shick M.D.   On: 11/05/2020 14:40   DG Chest Portable 1 View  Result Date: 11/04/2020 CLINICAL DATA:  80 year old female with history of chest pain and shortness of breath for 1 week. EXAM: PORTABLE CHEST 1 VIEW COMPARISON:  Chest x-ray 06/07/2020. FINDINGS: Defibrillator pads are noted projecting over the left hemithorax. Lung volumes are normal. No acute consolidative airspace disease. Diffuse peribronchial cuffing and widespread areas of interstitial prominence. No pneumothorax. Chronic bilateral apical pleuroparenchymal thickening and architectural distortion, similar to prior study, most compatible with chronic post infectious or inflammatory scarring. No definite evidence of pulmonary edema. Heart size is mildly enlarged. Upper mediastinal contours are within normal limits. Atherosclerosis in the thoracic aorta. Surgical clips project over the right upper quadrant of the abdomen, likely from prior cholecystectomy. IMPRESSION: 1. The appearance the chest is concerning for an acute bronchitis, as above. 2. Mild cardiomegaly. 3. Aortic atherosclerosis. Electronically Signed   By: Vinnie Langton M.D.   On: 11/04/2020 11:46   ECHOCARDIOGRAM COMPLETE  Result Date: 11/06/2020    ECHOCARDIOGRAM REPORT   Patient Name:   BREKLYNN WOLLAM Date of Exam: 11/06/2020 Medical Rec #:  [id number]        Height:       63.0 in Accession #:    [id number]       Weight:       154.3 lb Date of Birth:  [birth year]        BSA:          1.732 m Patient Age:    27 years         BP:           102/46 mmHg Patient Gender: F                HR:           77 bpm. Exam Location:  [facility] Procedure: 2D Echo, Cardiac Doppler and Color Doppler Indications:     Atrial fibrillation I48.91  History:         Patient has no prior history of Echocardiogram examinations.                   Hypokalemia.  Sonographer:     Sherrie Sport RDCS (AE) Referring Phys:  [place] Diagnosing Phys: Kate Sable MD  Sonographer Comments: Suboptimal apical window. IMPRESSIONS  1. Left ventricular ejection fraction, by estimation, is 60 to 65%. The left ventricle has normal function. The left ventricle has no regional wall motion abnormalities. There is mild left ventricular hypertrophy. Left ventricular diastolic parameters are consistent with Grade II diastolic dysfunction (pseudonormalization).  2. Right ventricular systolic function is normal. The right ventricular size is normal.  3. Left atrial size was moderately dilated.  4. The mitral valve is degenerative. No evidence of mitral valve regurgitation.  5. The aortic valve is grossly normal. Aortic valve regurgitation is not visualized. Mild aortic valve sclerosis is present, with no evidence of aortic valve stenosis.  6. The inferior vena cava is normal in size with greater than 50% respiratory variability, suggesting right atrial pressure of 3 mmHg. FINDINGS  Left Ventricle: Left ventricular ejection fraction, by estimation, is 60 to 65%. The left ventricle has normal function. The left ventricle has no regional wall motion abnormalities. The left ventricular internal cavity size was normal in size. There is  mild left ventricular hypertrophy.  Left ventricular diastolic parameters are consistent with Grade II diastolic dysfunction (pseudonormalization). Right Ventricle: The right ventricular size is normal. No increase in right ventricular wall thickness. Right ventricular systolic function is normal. Left Atrium: Left atrial size was moderately dilated. Right Atrium: Right atrial size was normal in size. Pericardium: There is no evidence of pericardial effusion. Mitral Valve: The mitral valve is degenerative in appearance. There is mild thickening of the mitral valve leaflet(s). Mild mitral annular calcification. No evidence of mitral valve  regurgitation. Tricuspid Valve: The tricuspid valve is normal in structure. Tricuspid valve regurgitation is mild. Aortic Valve: The aortic valve is grossly normal. Aortic valve regurgitation is not visualized. Mild aortic valve sclerosis is present, with no evidence of aortic valve stenosis. Aortic valve mean gradient measures 4.0 mmHg. Aortic valve peak gradient measures 6.9 mmHg. Aortic valve area, by VTI measures 2.35 cm. Pulmonic Valve: The pulmonic valve was not well visualized. Pulmonic valve regurgitation is not visualized. Aorta: The aortic root is normal in size and structure. Venous: The inferior vena cava is normal in size with greater than 50% respiratory variability, suggesting right atrial pressure of 3 mmHg. IAS/Shunts: No atrial level shunt detected by color flow Doppler.  LEFT VENTRICLE PLAX 2D LVIDd:         3.69 cm  Diastology LVIDs:         2.14 cm  LV e' medial:    5.77 cm/s LV PW:         1.17 cm  LV E/e' medial:  20.3 LV IVS:        0.79 cm  LV e' lateral:   5.87 cm/s LVOT diam:     2.00 cm  LV E/e' lateral: 19.9 LV SV:         66 LV SV Index:   38 LVOT Area:     3.14 cm  RIGHT VENTRICLE RV Basal diam:  2.59 cm LEFT ATRIUM             Index       RIGHT ATRIUM           Index LA diam:        4.00 cm 2.31 cm/m  RA Area:     11.50 cm LA Vol (A2C):   36.8 ml 21.25 ml/m RA Volume:   24.80 ml  14.32 ml/m LA Vol (A4C):   75.3 ml 43.48 ml/m LA Biplane Vol: 56.3 ml 32.51 ml/m  AORTIC VALVE                   PULMONIC VALVE AV Area (Vmax):    2.00 cm    PV Vmax:       0.68 m/s AV Area (Vmean):   2.10 cm    PV Peak grad:  1.8 mmHg AV Area (VTI):     2.35 cm AV Vmax:           131.00 cm/s AV Vmean:          90.900 cm/s AV VTI:            0.281 m AV Peak Grad:      6.9 mmHg AV Mean Grad:      4.0 mmHg LVOT Vmax:         83.60 cm/s LVOT Vmean:        60.700 cm/s LVOT VTI:          0.210 m LVOT/AV VTI ratio: 0.75  AORTA Ao Root diam: 2.90 cm MITRAL VALVE  TRICUSPID VALVE MV Area  (PHT): 3.76 cm     TR Peak grad:   28.5 mmHg MV Decel Time: 202 msec     TR Vmax:        267.00 cm/s MV E velocity: 117.00 cm/s MV A velocity: 96.40 cm/s   SHUNTS MV E/A ratio:  1.21         Systemic VTI:  0.21 m                             Systemic Diam: 2.00 cm Kate Sable MD Electronically signed by Kate Sable MD Signature Date/Time: 11/06/2020/11:57:40 AM    Final

## 2020-11-07 LAB — COMPREHENSIVE METABOLIC PANEL
ALT: 20 U/L (ref 0–44)
AST: 26 U/L (ref 15–41)
Albumin: 2.6 g/dL — ABNORMAL LOW (ref 3.5–5.0)
Alkaline Phosphatase: 79 U/L (ref 38–126)
Anion gap: 8 (ref 5–15)
BUN: 30 mg/dL — ABNORMAL HIGH (ref 8–23)
CO2: 23 mmol/L (ref 22–32)
Calcium: 7.9 mg/dL — ABNORMAL LOW (ref 8.9–10.3)
Chloride: 106 mmol/L (ref 98–111)
Creatinine, Ser: 1.9 mg/dL — ABNORMAL HIGH (ref 0.44–1.00)
GFR, Estimated: 26 mL/min — ABNORMAL LOW (ref >60)
Glucose, Bld: 85 mg/dL (ref 70–99)
Potassium: 3.3 mmol/L — ABNORMAL LOW (ref 3.5–5.1)
Sodium: 137 mmol/L (ref 135–145)
Total Bilirubin: 0.8 mg/dL (ref 0.3–1.2)
Total Protein: 5.1 g/dL — ABNORMAL LOW (ref 6.5–8.1)

## 2020-11-07 LAB — CBC
HCT: 29 % — ABNORMAL LOW (ref 36.0–46.0)
Hemoglobin: 10.1 g/dL — ABNORMAL LOW (ref 12.0–15.0)
MCH: 32.3 pg (ref 26.0–34.0)
MCHC: 34.8 g/dL (ref 30.0–36.0)
MCV: 92.7 fL (ref 80.0–100.0)
Platelets: 145 10*3/uL — ABNORMAL LOW (ref 150–400)
RBC: 3.13 MIL/uL — ABNORMAL LOW (ref 3.87–5.11)
RDW: 13.9 % (ref 11.5–15.5)
WBC: 4.3 10*3/uL (ref 4.0–10.5)
nRBC: 0 % (ref 0.0–0.2)

## 2020-11-07 LAB — MAGNESIUM: Magnesium: 2.6 mg/dL — ABNORMAL HIGH (ref 1.7–2.4)

## 2020-11-07 LAB — C-REACTIVE PROTEIN: CRP: 6.2 mg/dL — ABNORMAL HIGH (ref <1.0)

## 2020-11-07 LAB — D-DIMER, QUANTITATIVE: D-Dimer, Quant: 1.66 ug/mL-FEU — ABNORMAL HIGH (ref 0.00–0.50)

## 2020-11-07 MED ORDER — ONDANSETRON 4 MG PO TBDP: 4.0000 mg | ORAL_TABLET | Freq: Three times a day (TID) | ORAL | 0 refills | Status: DC | PRN

## 2020-11-07 MED ORDER — POTASSIUM CHLORIDE CRYS ER 20 MEQ PO TBCR
40.0000 meq | EXTENDED_RELEASE_TABLET | Freq: Once | ORAL | Status: AC
  Administered 2020-11-07: 40 meq via ORAL

## 2020-11-07 MED ORDER — ALBUTEROL SULFATE HFA 108 (90 BASE) MCG/ACT IN AERS: 2.0000 | INHALATION_SPRAY | Freq: Four times a day (QID) | RESPIRATORY_TRACT | 0 refills | Status: DC | PRN

## 2020-11-07 MED ORDER — BENZONATATE 100 MG PO CAPS: 100.0000 mg | ORAL_CAPSULE | Freq: Three times a day (TID) | ORAL | 0 refills | Status: AC | PRN

## 2020-11-07 MED ORDER — HYDROCOD POLST-CPM POLST ER 10-8 MG/5ML PO SUER: 5.0000 mL | Freq: Two times a day (BID) | ORAL | 0 refills | Status: DC | PRN

## 2020-11-07 MED ORDER — PREDNISONE 5 MG PO TABS: ORAL_TABLET | ORAL | 0 refills | Status: DC

## 2020-11-07 NOTE — Progress Notes (Signed)
Patient is being discharged home with home health.IV has been removed. Daughter is at bedside for discharge instructions. Ready for transportation home

## 2020-11-07 NOTE — Discharge Instructions (Signed)
Person Under Monitoring Name: Carol Kent  Location: [place] [address]   Infection Prevention Recommendations for Individuals Confirmed to have, or Being Evaluated for, 2019 Novel Coronavirus (COVID-19) Infection Who Receive Care at Home  Individuals who are confirmed to have, or are being evaluated for, COVID-19 should follow the prevention steps below until a healthcare provider or local or state health department says they can return to normal activities.  Stay home except to get medical care You should restrict activities outside your home, except for getting medical care. Do not go to work, school, or public areas, and do not use public transportation or taxis.  Call ahead before visiting your doctor Before your medical appointment, call the healthcare provider and tell them that you have, or are being evaluated for, COVID-19 infection. This will help the healthcare provider's office take steps to keep other people from getting infected. Ask your healthcare provider to call the local or state health department.  Monitor your symptoms Seek prompt medical attention if your illness is worsening (e.g., difficulty breathing). Before going to your medical appointment, call the healthcare provider and tell them that you have, or are being evaluated for, COVID-19 infection. Ask your healthcare provider to call the local or state health department.  Wear a facemask You should wear a facemask that covers your nose and mouth when you are in the same room with other people and when you visit a healthcare provider. People who live with or visit you should also wear a facemask while they are in the same room with you.  Separate yourself from other people in your home As much as possible, you should stay in a different room from other people in your home. Also, you should use a separate bathroom, if available.  Avoid sharing household items You should  not share dishes, drinking glasses, cups, eating utensils, towels, bedding, or other items with other people in your home. After using these items, you should wash them thoroughly with soap and water.  Cover your coughs and sneezes Cover your mouth and nose with a tissue when you cough or sneeze, or you can cough or sneeze into your sleeve. Throw used tissues in a lined trash can, and immediately wash your hands with soap and water for at least 20 seconds or use an alcohol-based hand rub.  Wash your [facility] your hands often and thoroughly with soap and water for at least 20 seconds. You can use an alcohol-based hand sanitizer if soap and water are not available and if your hands are not visibly dirty. Avoid touching your eyes, nose, and mouth with unwashed hands.   Prevention Steps for Caregivers and Household Members of Individuals Confirmed to have, or Being Evaluated for, COVID-19 Infection Being Cared for in the Home  If you live with, or provide care at home for, a person confirmed to have, or being evaluated for, COVID-19 infection please follow these guidelines to prevent infection:  Follow healthcare provider's instructions Make sure that you understand and can help the patient follow any healthcare provider instructions for all care.  Provide for the patient's basic needs You should help the patient with basic needs in the home and provide support for getting groceries, prescriptions, and other personal needs.  Monitor the patient's symptoms If they are getting sicker, call his or her medical provider and tell them that the patient has, or is being evaluated for, COVID-19 infection. This will help the healthcare  provider's office take steps to keep other people from getting infected. Ask the healthcare provider to call the local or state health department.  Limit the number of people who have contact with the patient If possible, have only one caregiver for the  patient. Other household members should stay in another home or place of residence. If this is not possible, they should stay in another room, or be separated from the patient as much as possible. Use a separate bathroom, if available. Restrict visitors who do not have an essential need to be in the home.  Keep older adults, very young children, and other sick people away from the patient Keep older adults, very young children, and those who have compromised immune systems or chronic health conditions away from the patient. This includes people with chronic heart, lung, or kidney conditions, diabetes, and cancer.  Ensure good ventilation Make sure that shared spaces in the home have good air flow, such as from an air conditioner or an opened window, weather permitting.  Wash your hands often Wash your hands often and thoroughly with soap and water for at least 20 seconds. You can use an alcohol based hand sanitizer if soap and water are not available and if your hands are not visibly dirty. Avoid touching your eyes, nose, and mouth with unwashed hands. Use disposable paper towels to dry your hands. If not available, use dedicated cloth towels and replace them when they become wet.  Wear a facemask and gloves Wear a disposable facemask at all times in the room and gloves when you touch or have contact with the patient's blood, body fluids, and/or secretions or excretions, such as sweat, saliva, sputum, nasal mucus, vomit, urine, or feces.  Ensure the mask fits over your nose and mouth tightly, and do not touch it during use. Throw out disposable facemasks and gloves after using them. Do not reuse. Wash your hands immediately after removing your facemask and gloves. If your personal clothing becomes contaminated, carefully remove clothing and launder. Wash your hands after handling contaminated clothing. Place all used disposable facemasks, gloves, and other waste in a lined container before  disposing them with other household waste. Remove gloves and wash your hands immediately after handling these items.  Do not share dishes, glasses, or other household items with the patient Avoid sharing household items. You should not share dishes, drinking glasses, cups, eating utensils, towels, bedding, or other items with a patient who is confirmed to have, or being evaluated for, COVID-19 infection. After the person uses these items, you should wash them thoroughly with soap and water.  Wash laundry thoroughly Immediately remove and wash clothes or bedding that have blood, body fluids, and/or secretions or excretions, such as sweat, saliva, sputum, nasal mucus, vomit, urine, or feces, on them. Wear gloves when handling laundry from the patient. Read and follow directions on labels of laundry or clothing items and detergent. In general, wash and dry with the warmest temperatures recommended on the label.  Clean all areas the individual has used often Clean all touchable surfaces, such as counters, tabletops, doorknobs, bathroom fixtures, toilets, phones, keyboards, tablets, and bedside tables, every day. Also, clean any surfaces that may have blood, body fluids, and/or secretions or excretions on them. Wear gloves when cleaning surfaces the patient has come in contact with. Use a diluted bleach solution (e.g., dilute bleach with 1 part bleach and 10 parts water) or a household disinfectant with a label that says EPA-registered for coronaviruses. To make  a bleach solution at home, add 1 tablespoon of bleach to 1 quart (4 cups) of water. For a larger supply, add  cup of bleach to 1 gallon (16 cups) of water. Read labels of cleaning products and follow recommendations provided on product labels. Labels contain instructions for safe and effective use of the cleaning product including precautions you should take when applying the product, such as wearing gloves or eye protection and making sure you  have good ventilation during use of the product. Remove gloves and wash hands immediately after cleaning.  Monitor yourself for signs and symptoms of illness Caregivers and household members are considered close contacts, should monitor their health, and will be asked to limit movement outside of the home to the extent possible. Follow the monitoring steps for close contacts listed on the symptom monitoring form.   ? If you have additional questions, contact your local health department or call the epidemiologist on call at [phone] (available 24/7). ? This guidance is subject to change. For the most up-to-date guidance from [facility], please refer to their website: [email or web address]

## 2020-11-07 NOTE — TOC Transition Note (Signed)
Transition of Care [facility]) - CM/SW Discharge Note   Patient Details  Name: Carol Kent MRN: [id number] Date of Birth: 07/21/1940  [facility] CM/SW Contact:  Shelbie Hutching, RN Phone Number: 11/07/2020, 11:18 AM   Clinical Narrative:    Patient is medically cleared for discharge home with home health services.  [name] with [facility] notified of discharge home today.  Orders for [facility] PT and OT are in.  Daughter will be picking the patient up today.     Final next level of care: Home w Home Health Services Barriers to Discharge: Barriers Resolved   Patient Goals and CMS Choice Patient states their goals for this hospitalization and ongoing recovery are:: [name] agrees with home health services at discharge- has used [facility] in the past CMS Medicare.gov Compare Post Acute Care list provided to:: Patient Represenative (must comment) Choice offered to / list presented to : Adult Children  Discharge Placement                       Discharge Plan and Services   Discharge Planning Services: CM Consult Post Acute Care Choice: Home Health          DME Arranged: N/A DME Agency: NA       HH Arranged: PT, OT [place] Agency: [place] ([facility]) Date HH Agency Contacted: 11/07/20 Time [place]: 1118 Representative spoke with at [place]: [place] ([place]) Interventions     Readmission Risk Interventions Readmission Risk Prevention Plan 11/05/2020  Transportation Screening Complete  PCP or Specialist Appt within 3-5 Days Complete  HRI or [place] Complete  Social Work Consult for [place] Planning/Counseling Complete  Palliative Care Screening Not Applicable  Medication Review Press photographer) Complete  Some recent data might be hidden

## 2020-11-07 NOTE — Care Management Important Message (Signed)
Important Message  Patient Details  Name: Carol Kent MRN: [id number] Date of Birth: 1940/09/02   Medicare Important Message Given:  Yes  I talked with the patients daughter, Otilio Saber that was visiting in the room. Patient in agreement for me to talk with her.  I reviewed the Important Message from Medicare with her and they are in agreement with the discharge for today.  I thanked her for her time.  Juliann Pulse A Senica Crall 11/07/2020, 11:23 AM

## 2020-11-07 NOTE — Discharge Summary (Signed)
PATIENT DETAILS Name: Carol Kent Age: 80 y.o. Sex: female Date of Birth: 09/26/1940 MRN: [id number]. Admitting Physician: Leslee Home, DO [id number], Jeneen Rinks, MD  Admit Date: 11/04/2020 Discharge date: 11/07/2020  Recommendations for Outpatient Follow-up:  Follow up with PCP in 1-2 weeks Please obtain CMP/CBC in one week Maxide on hold-PCP to reassess BP and resume accordingly at next visit.  Admitted From:  Home  Disposition: Home with home health services   [place]: Yes  Equipment/Devices: None  Discharge Condition: Stable  CODE STATUS: FULL CODE  Diet recommendation:  Diet Order             Diet - low sodium heart healthy           Diet Heart Room service appropriate? Yes; Fluid consistency: Thin  Diet effective now                    Brief Narrative: Patient is a 79 y.o. female with PMHx of RA, CKD stage IV, HTN, history of brain aneurysm-s/p craniotomy-who presented with weakness, diarrhea-patient was found to have AKI-and COVID-19 infection along with transient A. fib with RVR.  See below for further details.    COVID-19 vaccinated status: Unvaccinated   Significant Events: 8/7>> Admit to [facility]-A. fib RVR (transient)-AKI-diarrhea-COVID-19 infection.   Significant studies: 8/7>>Chest x-ray: No pneumonia/consolidation. 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  Colonic diverticulosis without diverticulitis. 8/8>> bilateral lower extremity Doppler: No DVT. 8/9>> Echo: EF [id number], grade 2 diastolic dysfunction   [id number] medications: Remdesivir: 8/7>>8/9   Antibiotics: None   Microbiology data: 8/8 >>blood culture: No growth   Procedures: None   Consults: None    Brief Hospital Course: Gastroenteritis due to COVID-19:Resolved with supportive care.  Continue to  monitor closely.   COVID-19 infection: No pneumonia on CXR-not hypoxic-completed Remdesivir x3 days.  Continues to cough-we will go home on antitussive therapy/inhaler regimen-is on chronic prednisone which I will slowly taper down to her usual home regimen.  She is unvaccinated-she was counseled extensively regarding importance of COVID-19 vaccination given her history of chronic steroid use/history of RA.   AKI on CKD stage IV: AKI hemodynamically mediated-continue supportive care-avoid nephrotoxic agents-creatinine improved with supportive care and close to baseline.   Hypokalemia: Continue to replete-recheck at PCPs office.   Hypomagnesemia: Repleted   PAF with RVR: Very transient-occurred briefly in the ED-telemetry monitoring currently negative for A. fib-echo with preserved EF.  Given that this episode was very brief-she has had a ruptured aneurysm-requiring craniotomy-avoid anticoagulation for now (long discussion with patient/daughter at bedside-both agree).  CHA2DS2-VASc of at least 3.  Echo with stable EF.   Syncope: Occurred while in the emergency room-high suspicion that this is probably related to either orthostatic mechanism or vasovagal.  Telemetry monitoring uneventful.  Right lower chest pain: Improved-probably from coughing-EKG/troponins negative.  Echo without any wall motion abnormalities.   Elevated D-dimer: Due to COVID-19-downtrending-lower extremity Dopplers negative for DVT.  No hypoxia-doubt further work-up required.  Was on prophylactic heparin.   HTN: BP stable without the use of any antihypertensive-continue to hold Maxide until reassessed by PCP.   Rheumatoid arthritis: No flare-on chronic prednisone/Plaquenil.  COVID-19 Labs:  Recent Labs    11/05/20 0538 11/05/20 0750 11/06/20 0734 11/07/20 0514  DDIMER  --  2.09* 1.83* 1.66*  CRP 1.9*  --  4.0* 6.2*    Lab Results  Component Value Date  SARSCOV2NAA POSITIVE (A) 11/04/2020   [place] NEGATIVE  11/13/2019        Procedures: None  Discharge Diagnoses:  Active Problems:   AKI (acute kidney injury) [facility])   Discharge Instructions:    Person Under Monitoring Name: Carol Kent  Location: [place] [address]   Infection Prevention Recommendations for Individuals Confirmed to have, or Being Evaluated for, 2019 Novel Coronavirus (COVID-19) Infection Who Receive Care at Home  Individuals who are confirmed to have, or are being evaluated for, COVID-19 should follow the prevention steps below until a healthcare provider or local or state health department says they can return to normal activities.  Stay home except to get medical care You should restrict activities outside your home, except for getting medical care. Do not go to work, school, or public areas, and do not use public transportation or taxis.  Call ahead before visiting your doctor Before your medical appointment, call the healthcare provider and tell them that you have, or are being evaluated for, COVID-19 infection. This will help the healthcare provider's office take steps to keep other people from getting infected. Ask your healthcare provider to call the local or state health department.  Monitor your symptoms Seek prompt medical attention if your illness is worsening (e.g., difficulty breathing). Before going to your medical appointment, call the healthcare provider and tell them that you have, or are being evaluated for, COVID-19 infection. Ask your healthcare provider to call the local or state health department.  Wear a facemask You should wear a facemask that covers your nose and mouth when you are in the same room with other people and when you visit a healthcare provider. People who live with or visit you should also wear a facemask while they are in the same room with you.  Separate yourself from other people in your home As much as possible, you should stay in a  different room from other people in your home. Also, you should use a separate bathroom, if available.  Avoid sharing household items You should not share dishes, drinking glasses, cups, eating utensils, towels, bedding, or other items with other people in your home. After using these items, you should wash them thoroughly with soap and water.  Cover your coughs and sneezes Cover your mouth and nose with a tissue when you cough or sneeze, or you can cough or sneeze into your sleeve. Throw used tissues in a lined trash can, and immediately wash your hands with soap and water for at least 20 seconds or use an alcohol-based hand rub.  Wash your [facility] your hands often and thoroughly with soap and water for at least 20 seconds. You can use an alcohol-based hand sanitizer if soap and water are not available and if your hands are not visibly dirty. Avoid touching your eyes, nose, and mouth with unwashed hands.   Prevention Steps for Caregivers and Household Members of Individuals Confirmed to have, or Being Evaluated for, COVID-19 Infection Being Cared for in the Home  If you live with, or provide care at home for, a person confirmed to have, or being evaluated for, COVID-19 infection please follow these guidelines to prevent infection:  Follow healthcare provider's instructions Make sure that you understand and can help the patient follow any healthcare provider instructions for all care.  Provide for the patient's basic needs You should help the patient with basic needs in the home and provide support for getting groceries, prescriptions, and other  personal needs.  Monitor the patient's symptoms If they are getting sicker, call his or her medical provider and tell them that the patient has, or is being evaluated for, COVID-19 infection. This will help the healthcare provider's office take steps to keep other people from getting infected. Ask the healthcare provider to call the local  or state health department.  Limit the number of people who have contact with the patient If possible, have only one caregiver for the patient. Other household members should stay in another home or place of residence. If this is not possible, they should stay in another room, or be separated from the patient as much as possible. Use a separate bathroom, if available. Restrict visitors who do not have an essential need to be in the home.  Keep older adults, very young children, and other sick people away from the patient Keep older adults, very young children, and those who have compromised immune systems or chronic health conditions away from the patient. This includes people with chronic heart, lung, or kidney conditions, diabetes, and cancer.  Ensure good ventilation Make sure that shared spaces in the home have good air flow, such as from an air conditioner or an opened window, weather permitting.  Wash your hands often Wash your hands often and thoroughly with soap and water for at least 20 seconds. You can use an alcohol based hand sanitizer if soap and water are not available and if your hands are not visibly dirty. Avoid touching your eyes, nose, and mouth with unwashed hands. Use disposable paper towels to dry your hands. If not available, use dedicated cloth towels and replace them when they become wet.  Wear a facemask and gloves Wear a disposable facemask at all times in the room and gloves when you touch or have contact with the patient's blood, body fluids, and/or secretions or excretions, such as sweat, saliva, sputum, nasal mucus, vomit, urine, or feces.  Ensure the mask fits over your nose and mouth tightly, and do not touch it during use. Throw out disposable facemasks and gloves after using them. Do not reuse. Wash your hands immediately after removing your facemask and gloves. If your personal clothing becomes contaminated, carefully remove clothing and launder. Wash your  hands after handling contaminated clothing. Place all used disposable facemasks, gloves, and other waste in a lined container before disposing them with other household waste. Remove gloves and wash your hands immediately after handling these items.  Do not share dishes, glasses, or other household items with the patient Avoid sharing household items. You should not share dishes, drinking glasses, cups, eating utensils, towels, bedding, or other items with a patient who is confirmed to have, or being evaluated for, COVID-19 infection. After the person uses these items, you should wash them thoroughly with soap and water.  Wash laundry thoroughly Immediately remove and wash clothes or bedding that have blood, body fluids, and/or secretions or excretions, such as sweat, saliva, sputum, nasal mucus, vomit, urine, or feces, on them. Wear gloves when handling laundry from the patient. Read and follow directions on labels of laundry or clothing items and detergent. In general, wash and dry with the warmest temperatures recommended on the label.  Clean all areas the individual has used often Clean all touchable surfaces, such as counters, tabletops, doorknobs, bathroom fixtures, toilets, phones, keyboards, tablets, and bedside tables, every day. Also, clean any surfaces that may have blood, body fluids, and/or secretions or excretions on them. Wear gloves when cleaning surfaces  the patient has come in contact with. Use a diluted bleach solution (e.g., dilute bleach with 1 part bleach and 10 parts water) or a household disinfectant with a label that says EPA-registered for coronaviruses. To make a bleach solution at home, add 1 tablespoon of bleach to 1 quart (4 cups) of water. For a larger supply, add  cup of bleach to 1 gallon (16 cups) of water. Read labels of cleaning products and follow recommendations provided on product labels. Labels contain instructions for safe and effective use of the cleaning  product including precautions you should take when applying the product, such as wearing gloves or eye protection and making sure you have good ventilation during use of the product. Remove gloves and wash hands immediately after cleaning.  Monitor yourself for signs and symptoms of illness Caregivers and household members are considered close contacts, should monitor their health, and will be asked to limit movement outside of the home to the extent possible. Follow the monitoring steps for close contacts listed on the symptom monitoring form.   ? If you have additional questions, contact your local health department or call the epidemiologist on call at [phone] (available 24/7). ? This guidance is subject to change. For the most up-to-date guidance from CDC, please refer to their website: YouBlogs.pl    Activity:  As tolerated with Full fall precautions use walker/cane & assistance as needed   Discharge Instructions     Call MD for:  difficulty breathing, headache or visual disturbances   Complete by: As directed    Diet - low sodium heart healthy   Complete by: As directed    Discharge instructions   Complete by: As directed    Follow with Primary MD  Maryland Pink, MD in 1-2 weeks  Please get a complete blood count and chemistry panel checked by your Primary MD at your next visit, and again as instructed by your Primary MD.  Get Medicines reviewed and adjusted: Please take all your medications with you for your next visit with your Primary MD  Laboratory/radiological data: Please request your Primary MD to go over all hospital tests and procedure/radiological results at the follow up, please ask your Primary MD to get all Hospital records sent to his/her office.  In some cases, they will be blood work, cultures and biopsy results pending at the time of your discharge. Please request that your primary care  M.D. follows up on these results.  Also Note the following: If you experience worsening of your admission symptoms, develop shortness of breath, life threatening emergency, suicidal or homicidal thoughts you must seek medical attention immediately by calling 911 or calling your MD immediately  if symptoms less severe.  You must read complete instructions/literature along with all the possible adverse reactions/side effects for all the Medicines you take and that have been prescribed to you. Take any new Medicines after you have completely understood and accpet all the possible adverse reactions/side effects.   Do not drive when taking Pain medications or sleeping medications (Benzodaizepines)  Do not take more than prescribed Pain, Sleep and Anxiety Medications. It is not advisable to combine anxiety,sleep and pain medications without talking with your primary care practitioner  Special Instructions: If you have smoked or chewed Tobacco  in the last 2 yrs please stop smoking, stop any regular Alcohol  and or any Recreational drug use.  Wear Seat belts while driving.  Please note: You were cared for by a hospitalist during your hospital stay. Once  you are discharged, your primary care physician will handle any further medical issues. Please note that NO REFILLS for any discharge medications will be authorized once you are discharged, as it is imperative that you return to your primary care physician (or establish a relationship with a primary care physician if you do not have one) for your post hospital discharge needs so that they can reassess your need for medications and monitor your lab values.   1.  7-10 days of isolation from day of her first positive COVID test or from the first day of your symptoms  2.  If you develop worsening shortness of breath-please seek immediate medical attention   Increase activity slowly   Complete by: As directed       Allergies as of 11/07/2020        Reactions   Codeine Nausea Only   Other reaction(s): Nausea   Erythromycin Nausea Only   Fentanyl    Other reaction(s): Hallucination   Morphine And Related    Other reaction(s): Other (See Comments)   Orencia [abatacept]    Propoxyphene Nausea Only   Other reaction(s): Nausea   Sulfa Antibiotics    Other reaction(s): Nausea   Other Rash   Pain med - can not remember name   Penicillins Rash   Other reaction(s): Hives        Medication List     STOP taking these medications    triamterene-hydrochlorothiazide 37.5-25 MG tablet Commonly known as: MAXZIDE-25       TAKE these medications    albuterol 108 (90 Base) MCG/ACT inhaler Commonly known as: VENTOLIN HFA Inhale 2 puffs into the lungs every 6 (six) hours as needed for wheezing or shortness of breath.   benzonatate 100 MG capsule Commonly known as: Tessalon Perles Take 1 capsule (100 mg total) by mouth 3 (three) times daily as needed for cough.   bismuth subsalicylate [id number] suspension Commonly known as: PEPTO BISMOL Take 30 mLs by mouth every 4 (four) hours as needed for diarrhea or loose stools or indigestion.   chlorpheniramine-HYDROcodone 10-8 MG/5ML Suer Commonly known as: Tussionex Pennkinetic ER Take 5 mLs by mouth every 12 (twelve) hours as needed for cough.   cyanocobalamin 1000 MCG tablet Take 1 tablet (1,000 mcg total) by mouth daily.   EPINEPHrine 0.3 mg/0.3 mL Soaj injection Commonly known as: EPI-PEN Inject 0.3 mg into the skin as directed.   hydroxychloroquine 200 MG tablet Commonly known as: PLAQUENIL Take 200 mg by mouth in the morning and at bedtime.   Klor-Con M10 10 MEQ tablet Generic drug: potassium chloride Take 10 mEq by mouth daily.   ondansetron 4 MG disintegrating tablet Commonly known as: Zofran ODT Take 1 tablet (4 mg total) by mouth every 8 (eight) hours as needed for nausea or vomiting.   predniSONE 5 MG tablet Commonly known as: DELTASONE Take 20 mg (4 tablets)  p.o. daily for 2 days, then 15 mg (3 tablets) p.o. daily for 2 days, then 10 mg (2 tablets) p.o. daily for 2 days, and then resume usual regimen of 7.5 mg p.o. daily What changed:  how much to take how to take this when to take this additional instructions   vitamin C 100 MG tablet Take 100 mg by mouth daily.        Follow-up Information     Maryland Pink, MD. Schedule an appointment as soon as possible for a visit in 1 week(s).   Specialty: Family Medicine Why: Hospital follow up  Contact information: [address] [place] [address] [phone]                Allergies  Allergen Reactions   Codeine Nausea Only    Other reaction(s): Nausea   Erythromycin Nausea Only   Fentanyl     Other reaction(s): Hallucination   Morphine And Related     Other reaction(s): Other (See Comments)   Orencia [Abatacept]    Propoxyphene Nausea Only    Other reaction(s): Nausea   Sulfa Antibiotics     Other reaction(s): Nausea   Other Rash    Pain med - can not remember name   Penicillins Rash    Other reaction(s): Hives      Consultations:  None   Other Procedures/Studies: CT ABDOMEN PELVIS WO CONTRAST  Result Date: 11/04/2020 CLINICAL DATA:  Abdominal pain and fever. Nausea, vomiting, diarrhea. EXAM: CT ABDOMEN AND PELVIS WITHOUT CONTRAST TECHNIQUE: Multidetector CT imaging of the abdomen and pelvis was performed following the standard protocol without IV contrast. COMPARISON:  11/13/2019 FINDINGS: Lower chest: Lungs are hyperinflated. Coarse interstitial markings identified at the bases, consistent with scarring. No pleural effusions. Coronary artery calcifications are present. Hepatobiliary: Cholecystectomy. Liver is homogeneous. No focal mass. Pancreas: Unremarkable. No pancreatic ductal dilatation or surrounding inflammatory changes. Spleen: Normal in size without focal abnormality. Adrenals/Urinary Tract: Adrenal glands are normal. There is  bilateral renal parenchymal thinning. No intrarenal calculi or mass. Ureters are unremarkable. The bladder and visualized portion of the urethra are normal. Stomach/Bowel: Small hiatal hernia. Stomach is otherwise normal. Small bowel loops are normal in appearance. There is fluid in the descending colon, consistent with history of diarrhea. Numerous colonic diverticula are present, vertically within the sigmoid colon. No associated inflammatory changes to indicate acute diverticulitis. The appendix is well seen and has a normal appearance. Vascular/Lymphatic: There is atherosclerotic calcification of the abdominal aorta, not associated with aneurysm. No retroperitoneal or mesenteric adenopathy. Reproductive: Uterus is present. No adnexal mass or free pelvic fluid. Other: Abdominal wall is unremarkable. Musculoskeletal: Degenerative changes in the lumbar spine. No acute abnormality. IMPRESSION: 1. Fluid within the colon, consistent with known diarrhea. 2. Colonic diverticulosis without acute diverticulitis. 3. Cholecystectomy. 4. Bilateral renal parenchymal thinning. No acute urinary tract abnormality. 5. Normal appendix. 6.  Aortic atherosclerosis.  (ICD10-I70.0) Electronically Signed   By: Nolon Nations M.D.   On: 11/04/2020 12:18   CT HEAD WO CONTRAST (5MM)  Result Date: 11/04/2020 CLINICAL DATA:  Altered mental status.  Nausea, vomiting, diarrhea. EXAM: CT HEAD WITHOUT CONTRAST TECHNIQUE: Contiguous axial images were obtained from the base of the skull through the vertex without intravenous contrast. COMPARISON:  None. FINDINGS: Brain: Status post right temporal craniotomy. 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 Mild diffuse parenchymal volume loss, commensurate with the patient's age. Mild periventricular white matter changes are present likely reflecting the sequela of small vessel ischemia. No evidence of acute intracranial hemorrhage or infarct. No abnormal mass  effect or midline shift. No abnormal intra or extra-axial mass lesion. Ventricular size is normal. Cerebellum is unremarkable. Vascular: No asymmetric hyperdense vasculature at the skull base. Skull: No acute fracture Sinuses/Orbits: The orbits are unremarkable. The paranasal sinuses are clear. Other: Mastoid air cells and middle ear cavities are clear. 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 Stable mild senescent change. No evidence of acute intracranial hemorrhage or infarct. Electronically Signed   By: Fidela Salisbury MD  On: 11/04/2020 12:14   US Venous Img Lower Bilateral (DVT)  Result Date: 11/05/2020 CLINICAL DATA:  COVID positive, lower extremity swelling, elevated D-dimer EXAM: BILATERAL LOWER EXTREMITY VENOUS DOPPLER ULTRASOUND TECHNIQUE: Gray-scale sonography with graded compression, as well as color Doppler and duplex ultrasound were performed to evaluate the lower extremity deep venous systems from the level of the common femoral vein and including the common femoral, femoral, profunda femoral, popliteal and calf veins including the posterior tibial, peroneal and gastrocnemius veins when visible. The superficial great saphenous vein was also interrogated. Spectral Doppler was utilized to evaluate flow at rest and with distal augmentation maneuvers in the common femoral, femoral and popliteal veins. COMPARISON:  None. FINDINGS: RIGHT LOWER EXTREMITY Common Femoral Vein: No evidence of thrombus. Normal compressibility, respiratory phasicity and response to augmentation. Saphenofemoral Junction: No evidence of thrombus. Normal compressibility and flow on color Doppler imaging. Profunda Femoral Vein: No evidence of thrombus. Normal compressibility and flow on color Doppler imaging. Femoral Vein: No evidence of thrombus. Normal compressibility, respiratory phasicity and response to augmentation. Popliteal Vein: No evidence of thrombus.  Normal compressibility, respiratory phasicity and response to augmentation. Calf Veins: No evidence of thrombus. Normal compressibility and flow on color Doppler imaging. LEFT LOWER EXTREMITY Common Femoral Vein: No evidence of thrombus. Normal compressibility, respiratory phasicity and response to augmentation. Saphenofemoral Junction: No evidence of thrombus. Normal compressibility and flow on color Doppler imaging. Profunda Femoral Vein: No evidence of thrombus. Normal compressibility and flow on color Doppler imaging. Femoral Vein: No evidence of thrombus. Normal compressibility, respiratory phasicity and response to augmentation. Popliteal Vein: No evidence of thrombus. Normal compressibility, respiratory phasicity and response to augmentation. Calf Veins: No evidence of thrombus. Normal compressibility and flow on color Doppler imaging. IMPRESSION: No evidence of deep venous thrombosis in either lower extremity. Electronically Signed   By: Jerilynn Mages.  Shick M.D.   On: 11/05/2020 14:40   DG Chest Portable 1 View  Result Date: 11/04/2020 CLINICAL DATA:  80 year old female with history of chest pain and shortness of breath for 1 week. EXAM: PORTABLE CHEST 1 VIEW COMPARISON:  Chest x-ray 06/07/2020. FINDINGS: Defibrillator pads are noted projecting over the left hemithorax. Lung volumes are normal. No acute consolidative airspace disease. Diffuse peribronchial cuffing and widespread areas of interstitial prominence. No pneumothorax. Chronic bilateral apical pleuroparenchymal thickening and architectural distortion, similar to prior study, most compatible with chronic post infectious or inflammatory scarring. No definite evidence of pulmonary edema. Heart size is mildly enlarged. Upper mediastinal contours are within normal limits. Atherosclerosis in the thoracic aorta. Surgical clips project over the right upper quadrant of the abdomen, likely from prior cholecystectomy. IMPRESSION: 1. The appearance the chest is  concerning for an acute bronchitis, as above. 2. Mild cardiomegaly. 3. Aortic atherosclerosis. Electronically Signed   By: Vinnie Langton M.D.   On: 11/04/2020 11:46   ECHOCARDIOGRAM COMPLETE  Result Date: 11/06/2020    ECHOCARDIOGRAM REPORT   Patient Name:   Carol Kent Date of Exam: 11/06/2020 Medical Rec #:  [id number]        Height:       63.0 in Accession #:    [id number]       Weight:       154.3 lb Date of Birth:  1941/03/12        BSA:          1.732 m Patient Age:    41 years         BP:  102/46 mmHg Patient Gender: F                HR:           77 bpm. Exam Location:  [facility] Procedure: 2D Echo, Cardiac Doppler and Color Doppler Indications:     Atrial fibrillation I48.91  History:         Patient has no prior history of Echocardiogram examinations.                  Hypokalemia.  Sonographer:     Sherrie Sport RDCS (AE) Referring Phys:  [place] Diagnosing Phys: Kate Sable MD  Sonographer Comments: Suboptimal apical window. IMPRESSIONS  1. Left ventricular ejection fraction, by estimation, is 60 to 65%. The left ventricle has normal function. The left ventricle has no regional wall motion abnormalities. There is mild left ventricular hypertrophy. Left ventricular diastolic parameters are consistent with Grade II diastolic dysfunction (pseudonormalization).  2. Right ventricular systolic function is normal. The right ventricular size is normal.  3. Left atrial size was moderately dilated.  4. The mitral valve is degenerative. No evidence of mitral valve regurgitation.  5. The aortic valve is grossly normal. Aortic valve regurgitation is not visualized. Mild aortic valve sclerosis is present, with no evidence of aortic valve stenosis.  6. The inferior vena cava is normal in size with greater than 50% respiratory variability, suggesting right atrial pressure of 3 mmHg. FINDINGS  Left Ventricle: Left ventricular ejection fraction, by estimation, is 60 to 65%. The left ventricle  has normal function. The left ventricle has no regional wall motion abnormalities. The left ventricular internal cavity size was normal in size. There is  mild left ventricular hypertrophy. Left ventricular diastolic parameters are consistent with Grade II diastolic dysfunction (pseudonormalization). Right Ventricle: The right ventricular size is normal. No increase in right ventricular wall thickness. Right ventricular systolic function is normal. Left Atrium: Left atrial size was moderately dilated. Right Atrium: Right atrial size was normal in size. Pericardium: There is no evidence of pericardial effusion. Mitral Valve: The mitral valve is degenerative in appearance. There is mild thickening of the mitral valve leaflet(s). Mild mitral annular calcification. No evidence of mitral valve regurgitation. Tricuspid Valve: The tricuspid valve is normal in structure. Tricuspid valve regurgitation is mild. Aortic Valve: The aortic valve is grossly normal. Aortic valve regurgitation is not visualized. Mild aortic valve sclerosis is present, with no evidence of aortic valve stenosis. Aortic valve mean gradient measures 4.0 mmHg. Aortic valve peak gradient measures 6.9 mmHg. Aortic valve area, by VTI measures 2.35 cm. Pulmonic Valve: The pulmonic valve was not well visualized. Pulmonic valve regurgitation is not visualized. Aorta: The aortic root is normal in size and structure. Venous: The inferior vena cava is normal in size with greater than 50% respiratory variability, suggesting right atrial pressure of 3 mmHg. IAS/Shunts: No atrial level shunt detected by color flow Doppler.  LEFT VENTRICLE PLAX 2D LVIDd:         3.69 cm  Diastology LVIDs:         2.14 cm  LV e' medial:    5.77 cm/s LV PW:         1.17 cm  LV E/e' medial:  20.3 LV IVS:        0.79 cm  LV e' lateral:   5.87 cm/s LVOT diam:     2.00 cm  LV E/e' lateral: 19.9 LV SV:         66  LV SV Index:   38 LVOT Area:     3.14 cm  RIGHT VENTRICLE RV Basal diam:   2.59 cm LEFT ATRIUM             Index       RIGHT ATRIUM           Index LA diam:        4.00 cm 2.31 cm/m  RA Area:     11.50 cm LA Vol (A2C):   36.8 ml 21.25 ml/m RA Volume:   24.80 ml  14.32 ml/m LA Vol (A4C):   75.3 ml 43.48 ml/m LA Biplane Vol: 56.3 ml 32.51 ml/m  AORTIC VALVE                   PULMONIC VALVE AV Area (Vmax):    2.00 cm    PV Vmax:       0.68 m/s AV Area (Vmean):   2.10 cm    PV Peak grad:  1.8 mmHg AV Area (VTI):     2.35 cm AV Vmax:           131.00 cm/s AV Vmean:          90.900 cm/s AV VTI:            0.281 m AV Peak Grad:      6.9 mmHg AV Mean Grad:      4.0 mmHg LVOT Vmax:         83.60 cm/s LVOT Vmean:        60.700 cm/s LVOT VTI:          0.210 m LVOT/AV VTI ratio: 0.75  AORTA Ao Root diam: 2.90 cm MITRAL VALVE                TRICUSPID VALVE MV Area (PHT): 3.76 cm     TR Peak grad:   28.5 mmHg MV Decel Time: 202 msec     TR Vmax:        267.00 cm/s MV E velocity: 117.00 cm/s MV A velocity: 96.40 cm/s   SHUNTS MV E/A ratio:  1.21         Systemic VTI:  0.21 m                             Systemic Diam: 2.00 cm Kate Sable MD Electronically signed by Kate Sable MD Signature Date/Time: 11/06/2020/11:57:40 AM    Final      TODAY-DAY OF DISCHARGE:  Subjective:   Carol Kent today has no headache,no chest abdominal pain,no new weakness tingling or numbness, feels much better wants to go home today.   Objective:   Blood pressure (!) 91/46, pulse 75, temperature 98 F (36.7 C), resp. rate 20, height '5\' 3"'$  (1.6 m), weight 70 kg, SpO2 95 %.  Intake/Output Summary (Last 24 hours) at 11/07/2020 1024 Last data filed at 11/06/2020 2000 Gross per 24 hour  Intake 1965.2 ml  Output --  Net 1965.2 ml   Filed Weights   11/04/20 1022  Weight: 70 kg    Exam: Awake Alert, Oriented *3, No new F.N deficits, Normal affect [place].AT,PERRAL Supple Neck,No JVD, No cervical lymphadenopathy appriciated.  Symmetrical Chest wall movement, Good air movement bilaterally,  CTAB RRR,No Gallops,Rubs or new Murmurs, No Parasternal Heave +ve B.Sounds, Abd Soft, Non tender, No organomegaly appriciated, No rebound -guarding or rigidity. No Cyanosis, Clubbing or edema, No new Rash or bruise  PERTINENT RADIOLOGIC STUDIES: CT ABDOMEN PELVIS WO CONTRAST  Result Date: 11/04/2020 CLINICAL DATA:  Abdominal pain and fever. Nausea, vomiting, diarrhea. EXAM: CT ABDOMEN AND PELVIS WITHOUT CONTRAST TECHNIQUE: Multidetector CT imaging of the abdomen and pelvis was performed following the standard protocol without IV contrast. COMPARISON:  11/13/2019 FINDINGS: Lower chest: Lungs are hyperinflated. Coarse interstitial markings identified at the bases, consistent with scarring. No pleural effusions. Coronary artery calcifications are present. Hepatobiliary: Cholecystectomy. Liver is homogeneous. No focal mass. Pancreas: Unremarkable. No pancreatic ductal dilatation or surrounding inflammatory changes. Spleen: Normal in size without focal abnormality. Adrenals/Urinary Tract: Adrenal glands are normal. There is bilateral renal parenchymal thinning. No intrarenal calculi or mass. Ureters are unremarkable. The bladder and visualized portion of the urethra are normal. Stomach/Bowel: Small hiatal hernia. Stomach is otherwise normal. Small bowel loops are normal in appearance. There is fluid in the descending colon, consistent with history of diarrhea. Numerous colonic diverticula are present, vertically within the sigmoid colon. No associated inflammatory changes to indicate acute diverticulitis. The appendix is well seen and has a normal appearance. Vascular/Lymphatic: There is atherosclerotic calcification of the abdominal aorta, not associated with aneurysm. No retroperitoneal or mesenteric adenopathy. Reproductive: Uterus is present. No adnexal mass or free pelvic fluid. Other: Abdominal wall is unremarkable. Musculoskeletal: Degenerative changes in the lumbar spine. No acute abnormality.  IMPRESSION: 1. Fluid within the colon, consistent with known diarrhea. 2. Colonic diverticulosis without acute diverticulitis. 3. Cholecystectomy. 4. Bilateral renal parenchymal thinning. No acute urinary tract abnormality. 5. Normal appendix. 6.  Aortic atherosclerosis.  (ICD10-I70.0) Electronically Signed   By: Nolon Nations M.D.   On: 11/04/2020 12:18   CT HEAD WO CONTRAST (5MM)  Result Date: 11/04/2020 CLINICAL DATA:  Altered mental status.  Nausea, vomiting, diarrhea. EXAM: CT HEAD WITHOUT CONTRAST TECHNIQUE: Contiguous axial images were obtained from the base of the skull through the vertex without intravenous contrast. COMPARISON:  None. FINDINGS: Brain: Status post right temporal craniotomy. 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 Mild diffuse parenchymal volume loss, commensurate with the patient's age. Mild periventricular white matter changes are present likely reflecting the sequela of small vessel ischemia. No evidence of acute intracranial hemorrhage or infarct. No abnormal mass effect or midline shift. No abnormal intra or extra-axial mass lesion. Ventricular size is normal. Cerebellum is unremarkable. Vascular: No asymmetric hyperdense vasculature at the skull base. Skull: No acute fracture Sinuses/Orbits: The orbits are unremarkable. The paranasal sinuses are clear. Other: Mastoid air cells and middle ear cavities are clear. 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 Stable mild senescent change. No evidence of acute intracranial hemorrhage or infarct. Electronically Signed   By: Fidela Salisbury MD   On: 11/04/2020 12:14   US Venous Img Lower Bilateral (DVT)  Result Date: 11/05/2020 CLINICAL DATA:  COVID positive, lower extremity swelling, elevated D-dimer EXAM: BILATERAL LOWER EXTREMITY VENOUS DOPPLER ULTRASOUND TECHNIQUE: Gray-scale sonography with graded compression, as well as color  Doppler and duplex ultrasound were performed to evaluate the lower extremity deep venous systems from the level of the common femoral vein and including the common femoral, femoral, profunda femoral, popliteal and calf veins including the posterior tibial, peroneal and gastrocnemius veins when visible. The superficial great saphenous vein was also interrogated. Spectral Doppler was utilized to evaluate flow at rest and with distal augmentation maneuvers in the common femoral, femoral and popliteal veins. COMPARISON:  None. FINDINGS: RIGHT LOWER EXTREMITY Common Femoral Vein: No evidence of thrombus. Normal  compressibility, respiratory phasicity and response to augmentation. Saphenofemoral Junction: No evidence of thrombus. Normal compressibility and flow on color Doppler imaging. Profunda Femoral Vein: No evidence of thrombus. Normal compressibility and flow on color Doppler imaging. Femoral Vein: No evidence of thrombus. Normal compressibility, respiratory phasicity and response to augmentation. Popliteal Vein: No evidence of thrombus. Normal compressibility, respiratory phasicity and response to augmentation. Calf Veins: No evidence of thrombus. Normal compressibility and flow on color Doppler imaging. LEFT LOWER EXTREMITY Common Femoral Vein: No evidence of thrombus. Normal compressibility, respiratory phasicity and response to augmentation. Saphenofemoral Junction: No evidence of thrombus. Normal compressibility and flow on color Doppler imaging. Profunda Femoral Vein: No evidence of thrombus. Normal compressibility and flow on color Doppler imaging. Femoral Vein: No evidence of thrombus. Normal compressibility, respiratory phasicity and response to augmentation. Popliteal Vein: No evidence of thrombus. Normal compressibility, respiratory phasicity and response to augmentation. Calf Veins: No evidence of thrombus. Normal compressibility and flow on color Doppler imaging. IMPRESSION: No evidence of deep venous  thrombosis in either lower extremity. Electronically Signed   By: Jerilynn Mages.  Shick M.D.   On: 11/05/2020 14:40   DG Chest Portable 1 View  Result Date: 11/04/2020 CLINICAL DATA:  80 year old female with history of chest pain and shortness of breath for 1 week. EXAM: PORTABLE CHEST 1 VIEW COMPARISON:  Chest x-ray 06/07/2020. FINDINGS: Defibrillator pads are noted projecting over the left hemithorax. Lung volumes are normal. No acute consolidative airspace disease. Diffuse peribronchial cuffing and widespread areas of interstitial prominence. No pneumothorax. Chronic bilateral apical pleuroparenchymal thickening and architectural distortion, similar to prior study, most compatible with chronic post infectious or inflammatory scarring. No definite evidence of pulmonary edema. Heart size is mildly enlarged. Upper mediastinal contours are within normal limits. Atherosclerosis in the thoracic aorta. Surgical clips project over the right upper quadrant of the abdomen, likely from prior cholecystectomy. IMPRESSION: 1. The appearance the chest is concerning for an acute bronchitis, as above. 2. Mild cardiomegaly. 3. Aortic atherosclerosis. Electronically Signed   By: Vinnie Langton M.D.   On: 11/04/2020 11:46   ECHOCARDIOGRAM COMPLETE  Result Date: 11/06/2020    ECHOCARDIOGRAM REPORT   Patient Name:   Carol Kent Date of Exam: 11/06/2020 Medical Rec #:  [id number]        Height:       63.0 in Accession #:    [id number]       Weight:       154.3 lb Date of Birth:  [birth year]        BSA:          1.732 m Patient Age:    37 years         BP:           102/46 mmHg Patient Gender: F                HR:           77 bpm. Exam Location:  [facility] Procedure: 2D Echo, Cardiac Doppler and Color Doppler Indications:     Atrial fibrillation I48.91  History:         Patient has no prior history of Echocardiogram examinations.                  Hypokalemia.  Sonographer:     Sherrie Sport RDCS (AE) Referring Phys:  [place]  Diagnosing Phys: Kate Sable MD  Sonographer Comments: Suboptimal apical window. IMPRESSIONS  1. Left ventricular ejection fraction, by estimation, is  60 to 65%. The left ventricle has normal function. The left ventricle has no regional wall motion abnormalities. There is mild left ventricular hypertrophy. Left ventricular diastolic parameters are consistent with Grade II diastolic dysfunction (pseudonormalization).  2. Right ventricular systolic function is normal. The right ventricular size is normal.  3. Left atrial size was moderately dilated.  4. The mitral valve is degenerative. No evidence of mitral valve regurgitation.  5. The aortic valve is grossly normal. Aortic valve regurgitation is not visualized. Mild aortic valve sclerosis is present, with no evidence of aortic valve stenosis.  6. The inferior vena cava is normal in size with greater than 50% respiratory variability, suggesting right atrial pressure of 3 mmHg. FINDINGS  Left Ventricle: Left ventricular ejection fraction, by estimation, is 60 to 65%. The left ventricle has normal function. The left ventricle has no regional wall motion abnormalities. The left ventricular internal cavity size was normal in size. There is  mild left ventricular hypertrophy. Left ventricular diastolic parameters are consistent with Grade II diastolic dysfunction (pseudonormalization). Right Ventricle: The right ventricular size is normal. No increase in right ventricular wall thickness. Right ventricular systolic function is normal. Left Atrium: Left atrial size was moderately dilated. Right Atrium: Right atrial size was normal in size. Pericardium: There is no evidence of pericardial effusion. Mitral Valve: The mitral valve is degenerative in appearance. There is mild thickening of the mitral valve leaflet(s). Mild mitral annular calcification. No evidence of mitral valve regurgitation. Tricuspid Valve: The tricuspid valve is normal in structure. Tricuspid valve  regurgitation is mild. Aortic Valve: The aortic valve is grossly normal. Aortic valve regurgitation is not visualized. Mild aortic valve sclerosis is present, with no evidence of aortic valve stenosis. Aortic valve mean gradient measures 4.0 mmHg. Aortic valve peak gradient measures 6.9 mmHg. Aortic valve area, by VTI measures 2.35 cm. Pulmonic Valve: The pulmonic valve was not well visualized. Pulmonic valve regurgitation is not visualized. Aorta: The aortic root is normal in size and structure. Venous: The inferior vena cava is normal in size with greater than 50% respiratory variability, suggesting right atrial pressure of 3 mmHg. IAS/Shunts: No atrial level shunt detected by color flow Doppler.  LEFT VENTRICLE PLAX 2D LVIDd:         3.69 cm  Diastology LVIDs:         2.14 cm  LV e' medial:    5.77 cm/s LV PW:         1.17 cm  LV E/e' medial:  20.3 LV IVS:        0.79 cm  LV e' lateral:   5.87 cm/s LVOT diam:     2.00 cm  LV E/e' lateral: 19.9 LV SV:         66 LV SV Index:   38 LVOT Area:     3.14 cm  RIGHT VENTRICLE RV Basal diam:  2.59 cm LEFT ATRIUM             Index       RIGHT ATRIUM           Index LA diam:        4.00 cm 2.31 cm/m  RA Area:     11.50 cm LA Vol (A2C):   36.8 ml 21.25 ml/m RA Volume:   24.80 ml  14.32 ml/m LA Vol (A4C):   75.3 ml 43.48 ml/m LA Biplane Vol: 56.3 ml 32.51 ml/m  AORTIC VALVE  PULMONIC VALVE AV Area (Vmax):    2.00 cm    PV Vmax:       0.68 m/s AV Area (Vmean):   2.10 cm    PV Peak grad:  1.8 mmHg AV Area (VTI):     2.35 cm AV Vmax:           131.00 cm/s AV Vmean:          90.900 cm/s AV VTI:            0.281 m AV Peak Grad:      6.9 mmHg AV Mean Grad:      4.0 mmHg LVOT Vmax:         83.60 cm/s LVOT Vmean:        60.700 cm/s LVOT VTI:          0.210 m LVOT/AV VTI ratio: 0.75  AORTA Ao Root diam: 2.90 cm MITRAL VALVE                TRICUSPID VALVE MV Area (PHT): 3.76 cm     TR Peak grad:   28.5 mmHg MV Decel Time: 202 msec     TR Vmax:        267.00  cm/s MV E velocity: 117.00 cm/s MV A velocity: 96.40 cm/s   SHUNTS MV E/A ratio:  1.21         Systemic VTI:  0.21 m                             Systemic Diam: 2.00 cm Kate Sable MD Electronically signed by Kate Sable MD Signature Date/Time: 11/06/2020/11:57:40 AM    Final      PERTINENT LAB RESULTS: CBC: Recent Labs    11/06/20 0734 11/07/20 0514  WBC 4.4 4.3  HGB 10.1* 10.1*  HCT 28.9* 29.0*  PLT 143* 145*   CMET CMP     Component Value Date/Time   NA 137 11/07/2020 0514   K 3.3 (L) 11/07/2020 0514   CL 106 11/07/2020 0514   CO2 23 11/07/2020 0514   GLUCOSE 85 11/07/2020 0514   BUN 30 (H) 11/07/2020 0514   CREATININE 1.90 (H) 11/07/2020 0514   CALCIUM 7.9 (L) 11/07/2020 0514   PROT 5.1 (L) 11/07/2020 0514   ALBUMIN 2.6 (L) 11/07/2020 0514   AST 26 11/07/2020 0514   ALT 20 11/07/2020 0514   ALKPHOS 79 11/07/2020 0514   BILITOT 0.8 11/07/2020 0514   GFRNONAA 26 (L) 11/07/2020 0514   GFRAA 30 (L) 11/17/2019 0417    GFR Estimated Creatinine Clearance: 21.4 mL/min (A) (by C-G formula based on SCr of 1.9 mg/dL (H)). Recent Labs    11/04/20 1029  LIPASE 51   No results for input(s): CKTOTAL, CKMB, CKMBINDEX, TROPONINI in the last 72 hours. Invalid input(s): POCBNP Recent Labs    11/06/20 0734 11/07/20 0514  DDIMER 1.83* 1.66*   No results for input(s): HGBA1C in the last 72 hours. No results for input(s): CHOL, HDL, LDLCALC, TRIG, CHOLHDL, LDLDIRECT in the last 72 hours. No results for input(s): TSH, T4TOTAL, T3FREE, THYROIDAB in the last 72 hours.  Invalid input(s): FREET3 No results for input(s): VITAMINB12, FOLATE, FERRITIN, TIBC, IRON, RETICCTPCT in the last 72 hours. Coags: No results for input(s): INR in the last 72 hours.  Invalid input(s): PT Microbiology: Recent Results (from the past 240 hour(s))  Resp Panel by RT-PCR (Flu A&B, Covid) Nasopharyngeal Swab     Status:  Abnormal   Collection Time: 11/04/20 10:29 AM   Specimen:  Nasopharyngeal Swab; Nasopharyngeal(NP) swabs in vial transport medium  Result Value Ref Range Status   SARS Coronavirus 2 by RT PCR POSITIVE (A) NEGATIVE Final    Comment: RESULT CALLED TO, READ BACK BY AND VERIFIED WITH: KELLY PENDLETON AT 1227 11/04/20.PMF (NOTE) SARS-CoV-2 target nucleic acids are DETECTED.  The SARS-CoV-2 RNA is generally detectable in upper respiratory specimens during the acute phase of infection. Positive results are indicative of the presence of the identified virus, but do not rule out bacterial infection or co-infection with other pathogens not detected by the test. Clinical correlation with patient history and other diagnostic information is necessary to determine patient infection status. The expected result is Negative.  Fact Sheet for Patients: EntrepreneurPulse.com.au  Fact Sheet for Healthcare Providers: IncredibleEmployment.be  This test is not yet approved or cleared by the [place] FDA and  has been authorized for detection and/or diagnosis of SARS-CoV-2 by FDA under an Emergency Use Authorization (EUA).  This EUA will remain in effect (meaning this test can b e used) for the duration of  the COVID-19 declaration under Section 564(b)(1) of the Act, 21 U.S.C. section 360bbb-3(b)(1), unless the authorization is terminated or revoked sooner.     Influenza A by PCR NEGATIVE NEGATIVE Final   Influenza B by PCR NEGATIVE NEGATIVE Final    Comment: (NOTE) The Xpert Xpress SARS-CoV-2/FLU/RSV plus assay is intended as an aid in the diagnosis of influenza from Nasopharyngeal swab specimens and should not be used as a sole basis for treatment. Nasal washings and aspirates are unacceptable for Xpert Xpress SARS-CoV-2/FLU/RSV testing.  Fact Sheet for Patients: EntrepreneurPulse.com.au  Fact Sheet for Healthcare Providers: IncredibleEmployment.be  This test is not yet approved  or cleared by the [place] FDA and has been authorized for detection and/or diagnosis of SARS-CoV-2 by FDA under an Emergency Use Authorization (EUA). This EUA will remain in effect (meaning this test can be used) for the duration of the COVID-19 declaration under Section 564(b)(1) of the Act, 21 U.S.C. section 360bbb-3(b)(1), unless the authorization is terminated or revoked.  Performed at [facility], [address] [place], [place] [address]   Blood culture (routine x 2)     Status: None (Preliminary result)   Collection Time: 11/05/20  5:38 AM   Specimen: BLOOD  Result Value Ref Range Status   Specimen Description BLOOD RIGHT ARM  Final   Special Requests   Final    BOTTLES DRAWN AEROBIC AND ANAEROBIC Blood Culture adequate volume   Culture   Final    NO GROWTH 2 DAYS Performed at [facility], [address], [place], [place] [address]    Report Status PENDING  Incomplete  Blood culture (routine x 2)     Status: None (Preliminary result)   Collection Time: 11/05/20  5:38 AM   Specimen: BLOOD  Result Value Ref Range Status   Specimen Description BLOOD LEFT ARM  Final   Special Requests   Final    BOTTLES DRAWN AEROBIC AND ANAEROBIC Blood Culture adequate volume   Culture   Final    NO GROWTH 2 DAYS Performed at [facility], [address], [place], [place] [address]    Report Status PENDING  Incomplete    FURTHER DISCHARGE INSTRUCTIONS:  Get Medicines reviewed and adjusted: Please take all your medications with you for your next visit with your Primary MD  Laboratory/radiological data: Please request your Primary MD to  go over all hospital tests and procedure/radiological results at the follow up, please ask your Primary MD to get all Hospital records sent to his/her office.  In some cases, they will be blood work, cultures and biopsy results pending at the time of your discharge. Please request that your primary care M.D. goes  through all the records of your hospital data and follows up on these results.  Also Note the following: If you experience worsening of your admission symptoms, develop shortness of breath, life threatening emergency, suicidal or homicidal thoughts you must seek medical attention immediately by calling 911 or calling your MD immediately  if symptoms less severe.  You must read complete instructions/literature along with all the possible adverse reactions/side effects for all the Medicines you take and that have been prescribed to you. Take any new Medicines after you have completely understood and accpet all the possible adverse reactions/side effects.   Do not drive when taking Pain medications or sleeping medications (Benzodaizepines)  Do not take more than prescribed Pain, Sleep and Anxiety Medications. It is not advisable to combine anxiety,sleep and pain medications without talking with your primary care practitioner  Special Instructions: If you have smoked or chewed Tobacco  in the last 2 yrs please stop smoking, stop any regular Alcohol  and or any Recreational drug use.  Wear Seat belts while driving.  Please note: You were cared for by a hospitalist during your hospital stay. Once you are discharged, your primary care physician will handle any further medical issues. Please note that NO REFILLS for any discharge medications will be authorized once you are discharged, as it is imperative that you return to your primary care physician (or establish a relationship with a primary care physician if you do not have one) for your post hospital discharge needs so that they can reassess your need for medications and monitor your lab values.  Total Time spent coordinating discharge including counseling, education and face to face time equals 35  minutes. SignedOren Binet 11/07/2020 10:24 AM

## 2020-11-10 LAB — CULTURE, BLOOD (ROUTINE X 2)
Culture: NO GROWTH
Culture: NO GROWTH
Special Requests: ADEQUATE
Special Requests: ADEQUATE

## 2021-02-17 ENCOUNTER — Emergency Department
Admission: EM | Admit: 2021-02-17 | Discharge: 2021-02-17 | Disposition: A | Discharge status: Home / Self Care | Payer: Medicare Other | Attending: Emergency Medicine | Admitting: Emergency Medicine

## 2021-02-17 ENCOUNTER — Emergency Department: Payer: Medicare Other

## 2021-02-17 ENCOUNTER — Other Ambulatory Visit: Payer: Self-pay

## 2021-02-17 DIAGNOSIS — N189 Chronic kidney disease, unspecified: Secondary | ICD-10-CM | POA: Diagnosis not present

## 2021-02-17 DIAGNOSIS — R0789 Other chest pain: Secondary | ICD-10-CM | POA: Insufficient documentation

## 2021-02-17 DIAGNOSIS — M5459 Other low back pain: Secondary | ICD-10-CM | POA: Insufficient documentation

## 2021-02-17 DIAGNOSIS — R109 Unspecified abdominal pain: Secondary | ICD-10-CM | POA: Insufficient documentation

## 2021-02-17 DIAGNOSIS — R079 Chest pain, unspecified: Secondary | ICD-10-CM

## 2021-02-17 LAB — BASIC METABOLIC PANEL
Anion gap: 8 (ref 5–15)
BUN: 32 mg/dL — ABNORMAL HIGH (ref 8–23)
CO2: 24 mmol/L (ref 22–32)
Calcium: 8.8 mg/dL — ABNORMAL LOW (ref 8.9–10.3)
Chloride: 106 mmol/L (ref 98–111)
Creatinine, Ser: 1.74 mg/dL — ABNORMAL HIGH (ref 0.44–1.00)
GFR, Estimated: 29 mL/min — ABNORMAL LOW (ref >60)
Glucose, Bld: 100 mg/dL — ABNORMAL HIGH (ref 70–99)
Potassium: 3.8 mmol/L (ref 3.5–5.1)
Sodium: 138 mmol/L (ref 135–145)

## 2021-02-17 LAB — CBC
HCT: 32.4 % — ABNORMAL LOW (ref 36.0–46.0)
Hemoglobin: 10.9 g/dL — ABNORMAL LOW (ref 12.0–15.0)
MCH: 30.5 pg (ref 26.0–34.0)
MCHC: 33.6 g/dL (ref 30.0–36.0)
MCV: 90.8 fL (ref 80.0–100.0)
Platelets: 226 10*3/uL (ref 150–400)
RBC: 3.57 MIL/uL — ABNORMAL LOW (ref 3.87–5.11)
RDW: 13.9 % (ref 11.5–15.5)
WBC: 8.4 10*3/uL (ref 4.0–10.5)
nRBC: 0 % (ref 0.0–0.2)

## 2021-02-17 LAB — TROPONIN I (HIGH SENSITIVITY)
Troponin I (High Sensitivity): 15 ng/L (ref <18)
Troponin I (High Sensitivity): 17 ng/L (ref <18)

## 2021-02-17 NOTE — ED Provider Notes (Signed)
[facility] Emergency Department Provider Note  Time seen: 2:40 AM  I have reviewed the triage vital signs and the nursing notes.   HISTORY  Chief Complaint Chest Pain  HPI Carol Kent is a 80 y.o. female with a past medical history of CKD, arthritis, possibly dementia, presents to the emergency department for chest pain.  According to the daughter the patient was experiencing pain in her chest and possibly in her back, states she called her crying that she was in pain so the daughter came over evaluated and recommended to go to the emergency department for evaluation.  Here the patient appears well, she denies just about every complaint besides very slight discomfort to the left lateral lower chest wall worse with movement.  Denies any shortness of breath nausea vomiting diaphoresis.  No recent cough or congestion.  Daughter is here with the patient who states earlier today she appeared to be uncomfortable which is only recently came to the emergency department, now she appears to be fine.   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 Authorizing Provider  albuterol (VENTOLIN HFA) 108 (90 Base) MCG/ACT inhaler Inhale 2 puffs into the lungs every 6 (six)  hours as needed for wheezing or shortness of breath. 11/07/20   Ghimire, Henreitta Leber, MD  Ascorbic Acid (VITAMIN C) 100 MG tablet Take 100 mg by mouth daily.    [provider]  benzonatate (TESSALON PERLES) 100 MG capsule Take 1 capsule (100 mg total) by mouth 3 (three) times daily as needed for cough. 11/07/20 11/07/21  Ghimire, Henreitta Leber, MD  bismuth subsalicylate (PEPTO BISMOL) 262 MG/15ML suspension Take 30 mLs by mouth every 4 (four) hours as needed for diarrhea or loose stools or indigestion.    [provider]  chlorpheniramine-HYDROcodone (TUSSIONEX PENNKINETIC ER) 10-8 MG/5ML SUER Take 5 mLs by mouth every 12 (twelve) hours as needed for cough. 11/07/20   Ghimire, Henreitta Leber, MD  EPINEPHrine 0.3 mg/0.3 mL IJ SOAJ injection Inject 0.3 mg into the skin as directed. 06/07/20   [provider]  hydroxychloroquine (PLAQUENIL) 200 MG tablet Take 200 mg by mouth in the morning and at bedtime. 01/19/20   [provider]  KLOR-CON M10 10 MEQ tablet Take 10 mEq by mouth daily. 07/14/20   [provider]  ondansetron (ZOFRAN ODT) 4 MG disintegrating tablet Take 1 tablet (4 mg total) by mouth every 8 (eight) hours as needed for nausea or vomiting. 11/07/20   Ghimire, Henreitta Leber, MD  predniSONE (DELTASONE) 5 MG tablet Take 20 mg (4 tablets) p.o. daily for 2 days, then 15 mg (3 tablets) p.o. daily for 2 days, then 10 mg (2 tablets) p.o. daily for 2 days, and  then resume usual regimen of 7.5 mg p.o. daily 11/07/20   Ghimire, Henreitta Leber, MD  vitamin B-12 1000 MCG tablet Take 1 tablet (1,000 mcg total) by mouth daily. 11/18/19   Loletha Grayer, MD    Allergies  Allergen Reactions   Codeine Nausea Only    Other reaction(s): Nausea   Erythromycin Nausea Only   Fentanyl     Other reaction(s): Hallucination   Morphine And Related     Other reaction(s): Other (See Comments)   Orencia [Abatacept]    Propoxyphene Nausea Only    Other reaction(s): Nausea   Sulfa Antibiotics      Other reaction(s): Nausea   Other Rash    Pain med - can not remember name   Penicillins Rash    Other reaction(s): Hives    No family history on file.  Social History Social History   Tobacco Use   Smoking status: Never   Smokeless tobacco: Never  Vaping Use   Vaping Use: Never used  Substance Use Topics   Alcohol use: No   Drug use: Never    Review of Systems Constitutional: Negative for fever. Cardiovascular: Left lower chest discomfort Respiratory: Negative for shortness of breath. Gastrointestinal: Negative for abdominal pain Musculoskeletal: Left lateral lower chest wall discomfort.  Worse with movement. Skin: Negative for skin complaints  Neurological: Negative for headache All other ROS negative  ____________________________________________   PHYSICAL EXAM:  VITAL SIGNS: ED Triage Vitals  Enc Vitals Group     BP 02/17/21 0058 (!) 142/65     Pulse Rate 02/17/21 0058 90     Resp 02/17/21 0058 20     Temp 02/17/21 0058 98 F (36.7 C)     Temp Source 02/17/21 0058 Oral     SpO2 02/17/21 0058 96 %     Weight 02/17/21 0059 154 lb 5.2 oz (70 kg)     Height 02/17/21 0059 5\' 3"  (1.6 m)     Head Circumference --      Peak Flow --      Pain Score 02/17/21 0058 5     Pain Loc --      Pain Edu? --      Excl. in [place]? --     Constitutional: Awake alert, no acute distress.  Calm and cooperative. Eyes: Normal exam ENT      Head: Normocephalic and atraumatic.      Mouth/Throat: Mucous membranes are moist. Cardiovascular: Normal rate, regular rhythm.  Respiratory: Normal respiratory effort without tachypnea nor retractions. Breath sounds are clear Gastrointestinal: Soft and nontender. No distention. Musculoskeletal: Very minimal left lateral lower chest wall tenderness palpation.  Otherwise no musculoskeletal pain identified.  No CT or L-spine tenderness.  No lower extremity edema or tenderness Neurologic:  Normal speech and language. No gross focal  neurologic deficits  Skin:  Skin is warm, dry and intact.  Psychiatric: Mood and affect are normal.   ____________________________________________    EKG  EKG viewed and interpreted by myself shows a sinus rhythm 87 bpm with a narrow QRS, normal axis, normal intervals, no concerning ST changes.  ____________________________________________    RADIOLOGY  This x-ray is negative.  ____________________________________________   INITIAL IMPRESSION / ASSESSMENT AND PLAN / ED COURSE  Pertinent labs & imaging results that were available during my care of the patient were reviewed by me and considered in my medical decision making (see chart for details).   Patient presents to the emergency department for possible chest pain earlier tonight.  Overall patient appears well, very slight left lateral lower chest wall tenderness to palpation worse with movement which very likely could be the cause of the patient's discomfort earlier today.  Patient's EKG is reassuring.  Chest x-ray is negative.  Lab work is largely at baseline with a negative troponin.  We will repeat a troponin.  Given the daughter's description of fairly significant pain which she states was in the lower back and possibly left flank earlier today we will obtain a CT renal scan as a precaution.  Patient and daughter are agreeable.  Patient's repeat troponin remains negative.  CT scan shows no significant or acute abnormality.  KACEE SUKHU was evaluated in Emergency Department on 02/17/2021 for the symptoms described in the history of present illness. She was evaluated in the context of the global COVID-19 pandemic, which necessitated consideration that the patient might be at risk for infection with the SARS-CoV-2 virus that causes COVID-19. Institutional protocols and algorithms that pertain to the evaluation of patients at risk for COVID-19 are in a state of rapid change based on information released by regulatory bodies  including the CDC and federal and state organizations. These policies and algorithms were followed during the patient's care in the ED.  ____________________________________________   FINAL CLINICAL IMPRESSION(S) / ED DIAGNOSES  Chest pain   Harvest Dark, MD 02/17/21 0400

## 2021-02-17 NOTE — ED Triage Notes (Signed)
FIRST NURSE NOTE:  Pt arrived via [facility] with reports of chest pain x 1 hour, sharp in nature, also c/o L flank pain, radiating to L arm, unable to rate pain for EMS, reports worse pain with inspiration.  DTR reports some possible dementia with fall a few days ago, no cough, no sick contacts.  138/62 p-92 97% RA CBG - 109

## 2021-02-17 NOTE — ED Triage Notes (Signed)
See first RN note; pt states pain awoke her from sleep. Pt A&O x4, NAD noted in triage.

## 2022-04-23 ENCOUNTER — Emergency Department: Payer: Medicare Other

## 2022-04-23 ENCOUNTER — Emergency Department
Admission: EM | Admit: 2022-04-23 | Discharge: 2022-04-23 | Disposition: A | Discharge status: Home / Self Care | Payer: Medicare Other | Attending: Emergency Medicine | Admitting: Emergency Medicine

## 2022-04-23 ENCOUNTER — Other Ambulatory Visit: Payer: Self-pay

## 2022-04-23 DIAGNOSIS — I509 Heart failure, unspecified: Secondary | ICD-10-CM | POA: Diagnosis not present

## 2022-04-23 DIAGNOSIS — W19XXXA Unspecified fall, initial encounter: Secondary | ICD-10-CM | POA: Diagnosis not present

## 2022-04-23 DIAGNOSIS — F0390 Unspecified dementia without behavioral disturbance: Secondary | ICD-10-CM | POA: Insufficient documentation

## 2022-04-23 DIAGNOSIS — M7989 Other specified soft tissue disorders: Secondary | ICD-10-CM | POA: Insufficient documentation

## 2022-04-23 DIAGNOSIS — M25551 Pain in right hip: Secondary | ICD-10-CM | POA: Diagnosis not present

## 2022-04-23 DIAGNOSIS — S3210XA Unspecified fracture of sacrum, initial encounter for closed fracture: Secondary | ICD-10-CM | POA: Diagnosis not present

## 2022-04-23 DIAGNOSIS — R600 Localized edema: Secondary | ICD-10-CM | POA: Insufficient documentation

## 2022-04-23 DIAGNOSIS — S3992XA Unspecified injury of lower back, initial encounter: Secondary | ICD-10-CM | POA: Diagnosis present

## 2022-04-23 DIAGNOSIS — S322XXA Fracture of coccyx, initial encounter for closed fracture: Secondary | ICD-10-CM | POA: Insufficient documentation

## 2022-04-23 DIAGNOSIS — N189 Chronic kidney disease, unspecified: Secondary | ICD-10-CM | POA: Diagnosis not present

## 2022-04-23 LAB — CBC WITH DIFFERENTIAL/PLATELET
Abs Immature Granulocytes: 0.03 10*3/uL (ref 0.00–0.07)
Basophils Absolute: 0 10*3/uL (ref 0.0–0.1)
Basophils Relative: 0 %
Eosinophils Absolute: 0 10*3/uL (ref 0.0–0.5)
Eosinophils Relative: 0 %
HCT: 34 % — ABNORMAL LOW (ref 36.0–46.0)
Hemoglobin: 10.7 g/dL — ABNORMAL LOW (ref 12.0–15.0)
Immature Granulocytes: 0 %
Lymphocytes Relative: 11 %
Lymphs Abs: 0.8 10*3/uL (ref 0.7–4.0)
MCH: 30 pg (ref 26.0–34.0)
MCHC: 31.5 g/dL (ref 30.0–36.0)
MCV: 95.2 fL (ref 80.0–100.0)
Monocytes Absolute: 0.4 10*3/uL (ref 0.1–1.0)
Monocytes Relative: 6 %
Neutro Abs: 5.7 10*3/uL (ref 1.7–7.7)
Neutrophils Relative %: 83 %
Platelets: 158 10*3/uL (ref 150–400)
RBC: 3.57 MIL/uL — ABNORMAL LOW (ref 3.87–5.11)
RDW: 13.5 % (ref 11.5–15.5)
WBC: 6.9 10*3/uL (ref 4.0–10.5)
nRBC: 0 % (ref 0.0–0.2)

## 2022-04-23 LAB — BASIC METABOLIC PANEL
Anion gap: 6 (ref 5–15)
BUN: 37 mg/dL — ABNORMAL HIGH (ref 8–23)
CO2: 26 mmol/L (ref 22–32)
Calcium: 8.6 mg/dL — ABNORMAL LOW (ref 8.9–10.3)
Chloride: 109 mmol/L (ref 98–111)
Creatinine, Ser: 1.76 mg/dL — ABNORMAL HIGH (ref 0.44–1.00)
GFR, Estimated: 28 mL/min — ABNORMAL LOW (ref >60)
Glucose, Bld: 94 mg/dL (ref 70–99)
Potassium: 4.3 mmol/L (ref 3.5–5.1)
Sodium: 141 mmol/L (ref 135–145)

## 2022-04-23 LAB — BRAIN NATRIURETIC PEPTIDE: B Natriuretic Peptide: 652.8 pg/mL — ABNORMAL HIGH (ref 0.0–100.0)

## 2022-04-23 MED ORDER — FUROSEMIDE 10 MG/ML IJ SOLN
40.0000 mg | Freq: Once | INTRAMUSCULAR | Status: AC
  Administered 2022-04-23: 40 mg via INTRAMUSCULAR
  Filled 2022-04-23: qty 4

## 2022-04-23 NOTE — ED Triage Notes (Signed)
Fa;; one week ago, c/o right hip pain.  Also c/o chronic bilateral leg swelling.  Seen through [facility] for fall, but pain continues to worsen.  More pain with ambulation.

## 2022-04-23 NOTE — ED Provider Triage Note (Signed)
Emergency Medicine Provider Triage Evaluation Note  Carol Kent , a 82 y.o. female  was evaluated in triage.  Pt complains of right hip pain after a fall last week, as well as bilateral leg swelling. Also has hard time walking due to pain. No pain at rest. Reports that she has no pain right now.  Review of Systems  Positive: Right hip pain Negative: Sob, cough, chest pain  Physical Exam  There were no vitals taken for this visit. Gen:   Awake, no distress   Resp:  Normal effort  MSK:   Moves extremities without difficulty Other:    Medical Decision Making  Medically screening exam initiated at 10:31 AM.  Appropriate orders placed.  Carol Kent was informed that the remainder of the evaluation will be completed by another provider, this initial triage assessment does not replace that evaluation, and the importance of remaining in the ED until their evaluation is complete.     Marquette Old, PA-C 04/23/22 1034

## 2022-04-23 NOTE — ED Provider Notes (Signed)
[facility] Provider Note    Event Date/Time   First MD Initiated Contact with Patient 04/23/22 1205     (approximate)   History   Hip Injury   HPI  Carol Kent is a 82 y.o. female with history of hypokalemia, CKD, rheumatoid arthritis, presents emergency department complaining of continued right hip pain after a fall a week ago.  Patient did not hit her head.  She does have dementia per her daughter.  States she has had more difficulty ambulating.  They are also concerned that she is tends to have more swelling than normal in her lower extremities.  She has Lasix at home but has not been taking it as she was told not to take it every day.  She denies any chest pain or shortness of breath.  No difficulty laying back when she sleeps.      Physical Exam   Triage Vital Signs: ED Triage Vitals  Enc Vitals Group     BP 04/23/22 1034 (!) 157/58     Pulse Rate 04/23/22 1034 75     Resp 04/23/22 1034 16     Temp 04/23/22 1034 97.8 F (36.6 C)     Temp Source 04/23/22 1034 Oral     SpO2 04/23/22 1034 97 %     Weight 04/23/22 1033 154 lb 5.2 oz (70 kg)     Height 04/23/22 1033 5\' 3"  (1.6 m)     Head Circumference --      Peak Flow --      Pain Score 04/23/22 1032 0     Pain Loc --      Pain Edu? --      Excl. in [place]? --     Most recent vital signs: Vitals:   04/23/22 1034 04/23/22 1422  BP: (!) 157/58 (!) 153/65  Pulse: 75 72  Resp: 16 16  Temp: 97.8 F (36.6 C) 97.7 F (36.5 C)  SpO2: 97% 100%     General: Awake, no distress.   CV:  Good peripheral perfusion. regular rate and  rhythm Resp:  Normal effort. Lungs cta Abd:  No distention.   Other:  Lumbar spine mildly tender to palpation, right hip tender along the greater trochanter, 2+ pitting edema noted in lower extremities   ED Results / Procedures / Treatments   Labs (all labs ordered are listed, but only abnormal results are displayed) Labs Reviewed  CBC WITH  DIFFERENTIAL/PLATELET - Abnormal; Notable for the following components:      Result Value   RBC 3.57 (*)    Hemoglobin 10.7 (*)    HCT 34.0 (*)    All other components within normal limits  BASIC METABOLIC PANEL - Abnormal; Notable for the following components:   BUN 37 (*)    Creatinine, Ser 1.76 (*)    Calcium 8.6 (*)    GFR, Estimated 28 (*)    All other components within normal limits  BRAIN NATRIURETIC PEPTIDE - Abnormal; Notable for the following components:   B Natriuretic Peptide 652.8 (*)    All other components within normal limits     EKG     RADIOLOGY  Chest x-ray, x-ray of the right hip, CT of the pelvis, CT lumbar spine    PROCEDURES:   Procedures   MEDICATIONS ORDERED IN ED: Medications  furosemide (LASIX) injection 40 mg (40 mg Intramuscular Given 04/23/22 1520)     IMPRESSION / MDM / ASSESSMENT AND PLAN / ED COURSE  I reviewed the triage vital signs and the nursing notes.                              Differential diagnosis includes, but is not limited to, fracture, contusion, strain, CHF, dependent edema, CKD  Patient's presentation is most consistent with acute complicated illness / injury requiring diagnostic workup.   Patient's labs are reassuring other than her BNP, BNP has doubled since last week.  She was 350 at [facility] last week: I did review her old charts.  Today her BNP was 652  X-ray of the right hip independently reviewed and interpreted by me as being negative for any acute abnormality  Due to the continued pain we will do a CT of the pelvis and CT lumbar spine  CT of the lumbar spine independently reviewed and interpreted by me, did review the radiologist report that states that she has a fracture of the sacrum which is nondisplaced it is a subtle fracture.  CT of the pelvis independently reviewed and interpreted by me as being negative for any acute abnormality  I did explain the findings to the patient and her  daughter.  I am concerned about the BNP being elevated.  In shared decision-making with the patient and her daughter we did decide to do an injection of Lasix here in the ED.  She can start p.o. Lasix at home tomorrow.  Do think that home health would be appropriate for the patient as she is having more difficulty ambulating and cannot take care of herself as well as she used to.  Also with the difficulty ambulating I did give them a DME for a rolling walker.  Patient is to follow-up with her regular doctor for recheck.  Strict instructions to return if worsening.  She is in agreement treatment plan.  Patient was discharged in the care of her daughter.  She is in stable condition at discharge      FINAL CLINICAL IMPRESSION(S) / ED DIAGNOSES   Final diagnoses:  Closed fracture of sacrum and coccyx, initial encounter ([place])  Chronic congestive heart failure, unspecified heart failure type [facility])     Rx / DC Orders   ED Discharge Orders          Ordered    For home use only DME 4 wheeled rolling walker with seat        04/23/22 1521             Note:  This document was prepared using Dragon voice recognition software and may include unintentional dictation errors.    Versie Starks, PA-C 04/23/22 1730    Carrie Mew, MD 04/25/22 [phone]

## 2022-05-02 ENCOUNTER — Encounter (INDEPENDENT_AMBULATORY_CARE_PROVIDER_SITE_OTHER): Payer: Self-pay | Admitting: Nurse Practitioner

## 2022-05-02 ENCOUNTER — Ambulatory Visit (INDEPENDENT_AMBULATORY_CARE_PROVIDER_SITE_OTHER): Payer: Medicare Other | Admitting: Nurse Practitioner

## 2022-05-02 VITALS — BP 129/63 | HR 80 | Ht 63.0 in | Wt 154.0 lb

## 2022-05-02 DIAGNOSIS — N184 Chronic kidney disease, stage 4 (severe): Secondary | ICD-10-CM | POA: Diagnosis not present

## 2022-05-02 DIAGNOSIS — R600 Localized edema: Secondary | ICD-10-CM

## 2022-05-07 ENCOUNTER — Encounter (INDEPENDENT_AMBULATORY_CARE_PROVIDER_SITE_OTHER): Payer: Self-pay | Admitting: Nurse Practitioner

## 2022-05-07 ENCOUNTER — Telehealth (INDEPENDENT_AMBULATORY_CARE_PROVIDER_SITE_OTHER): Payer: Self-pay

## 2022-05-07 NOTE — Telephone Encounter (Signed)
I suspect this is another blister.  She doesn't need to do anything to it at this point, just monitor it.  She has an appointment on Friday for a wrap and I can look at it then

## 2022-05-07 NOTE — Telephone Encounter (Signed)
Patient daughter called concerned about patients left leg.  She has noticed a blister like spot come up on the back of the patients left knee and its seems to be black and blue in color.  Patient daughter would like to know how to proceed.

## 2022-05-08 NOTE — Telephone Encounter (Signed)
Spoke with patients daughter Maudie Mercury.  They removed the unna boot Wednesday due to there concern.  Daughter also noticed increase in swelling in upper thigh.  Advised this would be evaluated at next appointment.

## 2022-05-09 ENCOUNTER — Encounter (INDEPENDENT_AMBULATORY_CARE_PROVIDER_SITE_OTHER): Payer: Self-pay

## 2022-05-09 ENCOUNTER — Ambulatory Visit (INDEPENDENT_AMBULATORY_CARE_PROVIDER_SITE_OTHER): Payer: Medicare Other | Admitting: Nurse Practitioner

## 2022-05-09 ENCOUNTER — Telehealth (INDEPENDENT_AMBULATORY_CARE_PROVIDER_SITE_OTHER): Payer: Self-pay

## 2022-05-09 VITALS — BP 149/73 | HR 83 | Resp 18

## 2022-05-09 DIAGNOSIS — R600 Localized edema: Secondary | ICD-10-CM

## 2022-05-09 MED ORDER — DOXYCYCLINE HYCLATE 100 MG PO CAPS: 100.0000 mg | ORAL_CAPSULE | Freq: Two times a day (BID) | ORAL | 0 refills | Status: DC

## 2022-05-09 NOTE — Telephone Encounter (Signed)
Home health orders has been sent to [facility] for skilled nursing for weekly bilateral unna wraps

## 2022-05-12 ENCOUNTER — Telehealth (INDEPENDENT_AMBULATORY_CARE_PROVIDER_SITE_OTHER): Payer: Self-pay

## 2022-05-12 ENCOUNTER — Encounter (INDEPENDENT_AMBULATORY_CARE_PROVIDER_SITE_OTHER): Payer: Self-pay | Admitting: Nurse Practitioner

## 2022-05-12 NOTE — Progress Notes (Signed)
Subjective:    Patient ID: Carol Kent, female    DOB: 10-03-1940, 82 y.o.   MRN: [id number] Chief Complaint  Patient presents with   New Patient (Initial Visit)    Bilateral edema    Carol Kent is an 82 year old female that presents today for evaluation for worsening lower extremity edema.  The patient has previously taken Lasix which has been helpful.  But despite that she has been having worsening lower extremity edema.  She tries to elevate her lower extremities when possible.  She has worn compression previously but does not like to wear it because it has been painful for her in the past.  She also has a blister currently on her right calf.  Otherwise there are no other open wounds or ulcerations.  Currently she does not have any weeping but she has had some previously.    Review of Systems  Cardiovascular:  Positive for leg swelling.  All other systems reviewed and are negative.      Objective:   Physical Exam Vitals reviewed.  HENT:     Head: Normocephalic.  Cardiovascular:     Rate and Rhythm: Normal rate.  Pulmonary:     Effort: Pulmonary effort is normal.  Musculoskeletal:     Right lower leg: 2+ Pitting Edema present.     Left lower leg: 2+ Pitting Edema present.  Skin:    General: Skin is warm and dry.  Neurological:     Mental Status: She is alert and oriented to person, place, and time.  Psychiatric:        Mood and Affect: Mood normal.        Behavior: Behavior normal.        Thought Content: Thought content normal.        Judgment: Judgment normal.     BP 129/63   Pulse 80   Ht 5' 3"$  (1.6 m)   Wt 154 lb (69.9 kg)   BMI 27.28 kg/m   Past Medical History:  Diagnosis Date   Brain aneurysm    History of cervical fracture     Social History   Socioeconomic History   Marital status: Married    Spouse name: Not on file   Number of children: Not on file   Years of education: Not on file   Highest education level: Not on file   Occupational History   Not on file  Tobacco Use   Smoking status: Never   Smokeless tobacco: Never  Vaping Use   Vaping Use: Never used  Substance and Sexual Activity   Alcohol use: No   Drug use: Never   Sexual activity: Not on file  Other Topics Concern   Not on file  Social History Narrative   Not on file   Social Determinants of Health   Financial Resource Strain: Not on file  Food Insecurity: Not on file  Transportation Needs: Not on file  Physical Activity: Not on file  Stress: Not on file  Social Connections: Not on file  Intimate Partner Violence: Not on file    Past Surgical History:  Procedure Laterality Date   COLONOSCOPY WITH PROPOFOL N/A 11/14/2019   Procedure: COLONOSCOPY WITH PROPOFOL;  Surgeon: Jonathon Bellows, MD;  Location: [facility] ENDOSCOPY;  Service: Gastroenterology;  Laterality: N/A;   TUBAL LIGATION      History reviewed. No pertinent family history.  Allergies  Allergen Reactions   Codeine Nausea Only    Other reaction(s): Nausea   Erythromycin Nausea  Only   Fentanyl     Other reaction(s): Hallucination   Morphine And Related     Other reaction(s): Other (See Comments)   Orencia [Abatacept]    Propoxyphene Nausea Only    Other reaction(s): Nausea   Sulfa Antibiotics     Other reaction(s): Nausea   Other Rash    Pain med - can not remember name   Penicillins Rash    Other reaction(s): Hives       Latest Ref Rng & Units 04/23/2022   12:24 PM 02/17/2021    1:01 AM 11/07/2020    5:14 AM  CBC  WBC 4.0 - 10.5 K/uL 6.9  8.4  4.3   Hemoglobin 12.0 - 15.0 g/dL 10.7  10.9  10.1   Hematocrit 36.0 - 46.0 % 34.0  32.4  29.0   Platelets 150 - 400 K/uL 158  226  145       CMP     Component Value Date/Time   NA 141 04/23/2022 1224   K 4.3 04/23/2022 1224   CL 109 04/23/2022 1224   CO2 26 04/23/2022 1224   GLUCOSE 94 04/23/2022 1224   BUN 37 (H) 04/23/2022 1224   CREATININE 1.76 (H) 04/23/2022 1224   CALCIUM 8.6 (L) 04/23/2022 1224    PROT 5.1 (L) 11/07/2020 0514   ALBUMIN 2.6 (L) 11/07/2020 0514   AST 26 11/07/2020 0514   ALT 20 11/07/2020 0514   ALKPHOS 79 11/07/2020 0514   BILITOT 0.8 11/07/2020 0514   GFRNONAA 28 (L) 04/23/2022 1224   GFRAA 30 (L) 11/17/2019 0417     No results found.     Assessment & Plan:   1. Bilateral lower extremity edema No surgery or intervention at this point in time.    I have had a long discussion with the patient regarding venous insufficiency and why it  causes symptoms, specifically venous ulceration. I have discussed with the patient the chronic skin changes that accompany venous insufficiency and the long term sequela such as infection and recurring  ulceration.  Patient will be placed in [employer] which will be changed weekly drainage permitting.  In addition, behavioral modification including several periods of elevation of the lower extremities during the day will be continued. Achieving a position with the ankles at heart level was stressed to the patient  The patient is instructed to begin routine exercise, especially walking on a daily basis  In the future the patient can be assessed for graduated compression stockings or wraps as well as a Lymph Pump once the ulcers are healed.  Will also have the patient follow-up with bilateral venous reflux studies.   2. Chronic kidney disease (CKD), stage IV (severe) (HCC) This is also likely contributing to the patient's overall lower extremity edema.  Current Outpatient Medications on File Prior to Visit  Medication Sig Dispense Refill   albuterol (VENTOLIN HFA) 108 (90 Base) MCG/ACT inhaler Inhale 2 puffs into the lungs every 6 (six) hours as needed for wheezing or shortness of breath. 8 g 0   Ascorbic Acid (VITAMIN C) 100 MG tablet Take 100 mg by mouth daily.     bismuth subsalicylate (PEPTO BISMOL) 262 MG/15ML suspension Take 30 mLs by mouth every 4 (four) hours as needed for diarrhea or loose stools or indigestion.      EPINEPHrine 0.3 mg/0.3 mL IJ SOAJ injection Inject 0.3 mg into the skin as directed.     hydroxychloroquine (PLAQUENIL) 200 MG tablet Take 200 mg by mouth in  the morning and at bedtime.     KLOR-CON M10 10 MEQ tablet Take 10 mEq by mouth daily.     ondansetron (ZOFRAN ODT) 4 MG disintegrating tablet Take 1 tablet (4 mg total) by mouth every 8 (eight) hours as needed for nausea or vomiting. 20 tablet 0   predniSONE (DELTASONE) 5 MG tablet Take 20 mg (4 tablets) p.o. daily for 2 days, then 15 mg (3 tablets) p.o. daily for 2 days, then 10 mg (2 tablets) p.o. daily for 2 days, and then resume usual regimen of 7.5 mg p.o. daily 50 tablet 0   vitamin B-12 1000 MCG tablet Take 1 tablet (1,000 mcg total) by mouth daily. 30 tablet 0   No current facility-administered medications on file prior to visit.    There are no Patient Instructions on file for this visit. No follow-ups on file.   Kris Hartmann, NP

## 2022-05-12 NOTE — Telephone Encounter (Signed)
Left message informing patient daughter to call and make appointment for unna boot change and for her to let us know what medication she would like.

## 2022-05-12 NOTE — Telephone Encounter (Signed)
Patient daughter called back and said that the pharmacist told her that there is a slight interaction between doxy and plaquenil.  The daughter also stated that the patients legs are still weeping and the blister has filled back up causing both unna boots to be soiled.  She would like to know how to proceed.

## 2022-05-12 NOTE — Progress Notes (Incomplete)
Subjective:    Patient ID: Carol Kent, female    DOB: May 24, 1940, 82 y.o.   MRN: [id number] Chief Complaint  Patient presents with  . New Patient (Initial Visit)    Bilateral edema    Carol Kent is an 82 year old female that presents today for evaluation for worsening lower extremity edema.  The patient has previously taken Lasix which has been helpful.  But despite that she has been having worsening lower extremity edema.  She tries to elevate her lower extremities when possible.  She has worn compression previously but does not like to wear it because it has been painful for her in the past.  She also has a blister currently on her right calf.    Review of Systems  Cardiovascular:  Positive for leg swelling.  All other systems reviewed and are negative.      Objective:   Physical Exam Vitals reviewed.  HENT:     Head: Normocephalic.  Cardiovascular:     Rate and Rhythm: Normal rate.  Pulmonary:     Effort: Pulmonary effort is normal.  Musculoskeletal:     Right lower leg: 2+ Pitting Edema present.     Left lower leg: 2+ Pitting Edema present.  Skin:    General: Skin is warm and dry.  Neurological:     Mental Status: She is alert and oriented to person, place, and time.  Psychiatric:        Mood and Affect: Mood normal.        Behavior: Behavior normal.        Thought Content: Thought content normal.        Judgment: Judgment normal.     BP 129/63   Pulse 80   Ht 5' 3"$  (1.6 m)   Wt 154 lb (69.9 kg)   BMI 27.28 kg/m   Past Medical History:  Diagnosis Date  . Brain aneurysm   . History of cervical fracture     Social History   Socioeconomic History  . Marital status: Married    Spouse name: Not on file  . Number of children: Not on file  . Years of education: Not on file  . Highest education level: Not on file  Occupational History  . Not on file  Tobacco Use  . Smoking status: Never  . Smokeless tobacco: Never  Vaping Use  . Vaping  Use: Never used  Substance and Sexual Activity  . Alcohol use: No  . Drug use: Never  . Sexual activity: Not on file  Other Topics Concern  . Not on file  Social History Narrative  . Not on file   Social Determinants of Health   Financial Resource Strain: Not on file  Food Insecurity: Not on file  Transportation Needs: Not on file  Physical Activity: Not on file  Stress: Not on file  Social Connections: Not on file  Intimate Partner Violence: Not on file    Past Surgical History:  Procedure Laterality Date  . COLONOSCOPY WITH PROPOFOL N/A 11/14/2019   Procedure: COLONOSCOPY WITH PROPOFOL;  Surgeon: Jonathon Bellows, MD;  Location: [facility] ENDOSCOPY;  Service: Gastroenterology;  Laterality: N/A;  . TUBAL LIGATION      History reviewed. No pertinent family history.  Allergies  Allergen Reactions  . Codeine Nausea Only    Other reaction(s): Nausea  . Erythromycin Nausea Only  . Fentanyl     Other reaction(s): Hallucination  . Morphine And Related     Other reaction(s): Other (See  Comments)  . Orencia [Abatacept]   . Propoxyphene Nausea Only    Other reaction(s): Nausea  . Sulfa Antibiotics     Other reaction(s): Nausea  . Other Rash    Pain med - can not remember name  . Penicillins Rash    Other reaction(s): Hives       Latest Ref Rng & Units 04/23/2022   12:24 PM 02/17/2021    1:01 AM 11/07/2020    5:14 AM  CBC  WBC 4.0 - 10.5 K/uL 6.9  8.4  4.3   Hemoglobin 12.0 - 15.0 g/dL 10.7  10.9  10.1   Hematocrit 36.0 - 46.0 % 34.0  32.4  29.0   Platelets 150 - 400 K/uL 158  226  145       CMP     Component Value Date/Time   NA 141 04/23/2022 1224   K 4.3 04/23/2022 1224   CL 109 04/23/2022 1224   CO2 26 04/23/2022 1224   GLUCOSE 94 04/23/2022 1224   BUN 37 (H) 04/23/2022 1224   CREATININE 1.76 (H) 04/23/2022 1224   CALCIUM 8.6 (L) 04/23/2022 1224   PROT 5.1 (L) 11/07/2020 0514   ALBUMIN 2.6 (L) 11/07/2020 0514   AST 26 11/07/2020 0514   ALT 20 11/07/2020 0514    ALKPHOS 79 11/07/2020 0514   BILITOT 0.8 11/07/2020 0514   GFRNONAA 28 (L) 04/23/2022 1224   GFRAA 30 (L) 11/17/2019 0417     No results found.     Assessment & Plan:   1. Bilateral lower extremity edema No surgery or intervention at this point in time.    I have had a long discussion with the patient regarding venous insufficiency and why it  causes symptoms, specifically venous ulceration. I have discussed with the patient the chronic skin changes that accompany venous insufficiency and the long term sequela such as infection and recurring  ulceration.  Patient will be placed in [employer] which will be changed weekly drainage permitting.  In addition, behavioral modification including several periods of elevation of the lower extremities during the day will be continued. Achieving a position with the ankles at heart level was stressed to the patient  The patient is instructed to begin routine exercise, especially walking on a daily basis  In the future the patient can be assessed for graduated compression stockings or wraps as well as a Lymph Pump once the ulcers are healed.  Will also have the patient follow-up with bilateral venous reflux studies.   2. Chronic kidney disease (CKD), stage IV (severe) (HCC) This is also likely contributing to the patient's overall lower extremity edema.  Current Outpatient Medications on File Prior to Visit  Medication Sig Dispense Refill  . albuterol (VENTOLIN HFA) 108 (90 Base) MCG/ACT inhaler Inhale 2 puffs into the lungs every 6 (six) hours as needed for wheezing or shortness of breath. 8 g 0  . Ascorbic Acid (VITAMIN C) 100 MG tablet Take 100 mg by mouth daily.    [name] bismuth subsalicylate (PEPTO BISMOL) 262 MG/15ML suspension Take 30 mLs by mouth every 4 (four) hours as needed for diarrhea or loose stools or indigestion.    [name] EPINEPHrine 0.3 mg/0.3 mL IJ SOAJ injection Inject 0.3 mg into the skin as directed.    . hydroxychloroquine  (PLAQUENIL) 200 MG tablet Take 200 mg by mouth in the morning and at bedtime.    [name] KLOR-CON M10 10 MEQ tablet Take 10 mEq by mouth daily.    [name]  ondansetron (ZOFRAN ODT) 4 MG disintegrating tablet Take 1 tablet (4 mg total) by mouth every 8 (eight) hours as needed for nausea or vomiting. 20 tablet 0  . predniSONE (DELTASONE) 5 MG tablet Take 20 mg (4 tablets) p.o. daily for 2 days, then 15 mg (3 tablets) p.o. daily for 2 days, then 10 mg (2 tablets) p.o. daily for 2 days, and then resume usual regimen of 7.5 mg p.o. daily 50 tablet 0  . vitamin B-12 1000 MCG tablet Take 1 tablet (1,000 mcg total) by mouth daily. 30 tablet 0   No current facility-administered medications on file prior to visit.    There are no Patient Instructions on file for this visit. No follow-ups on file.   Kris Hartmann, NP

## 2022-05-12 NOTE — Telephone Encounter (Signed)
Bring her in to change unna boots, I would also reach out to her PCP because it sounds like a large part of her issue is that she is fluid overloaded and she may need some diuresis.  As far as the doxy and plaquenil, the only interaction I could see was that it can cause lab work to be altered.  We could switch to keflex however, there is the possibility that it may not work and we have to switch it out again, given her allergies, her options are somewhat limited

## 2022-05-12 NOTE — Telephone Encounter (Signed)
Patients daughter called concerned that doxycycline interacts with patients arthritis medication.  She says patient does well on cephalexin.  Please advise if change is appropriate.

## 2022-05-12 NOTE — Telephone Encounter (Signed)
We can try keflex, but I am not a fan of keflex because I feel that it generally doesn't treat skin infections well.  What medication is she referring to? I don't see an arthritis medicine that doxy would interact with on her med list

## 2022-05-12 NOTE — Telephone Encounter (Signed)
Left message for patient daughter to call back to discuss medication.

## 2022-05-13 ENCOUNTER — Encounter (INDEPENDENT_AMBULATORY_CARE_PROVIDER_SITE_OTHER): Payer: Self-pay

## 2022-05-13 ENCOUNTER — Ambulatory Visit (INDEPENDENT_AMBULATORY_CARE_PROVIDER_SITE_OTHER): Payer: Medicare Other | Admitting: Nurse Practitioner

## 2022-05-13 VITALS — BP 145/87 | HR 85 | Resp 18

## 2022-05-13 DIAGNOSIS — R600 Localized edema: Secondary | ICD-10-CM | POA: Diagnosis not present

## 2022-05-13 NOTE — Telephone Encounter (Signed)
Spoke with patient daughter and she is going to pick up the doxy and bring the patient in for unna boot change.

## 2022-05-13 NOTE — Progress Notes (Signed)
History of Present Illness  There is no documented history at this time  Assessments & Plan   There are no diagnoses linked to this encounter.    Additional instructions  Subjective:  Patient presents with venous ulcer of the Bilateral lower extremity.    Procedure:  3 layer unna wrap was placed Bilateral lower extremity.   Plan:   Follow up in one week.   Bilateral lower extremity wrapped with kerlix,xeroform,and ace bandage

## 2022-05-16 ENCOUNTER — Encounter (INDEPENDENT_AMBULATORY_CARE_PROVIDER_SITE_OTHER): Payer: Medicare Other

## 2022-05-22 ENCOUNTER — Encounter (INDEPENDENT_AMBULATORY_CARE_PROVIDER_SITE_OTHER): Payer: Medicare Other

## 2022-05-23 ENCOUNTER — Telehealth (INDEPENDENT_AMBULATORY_CARE_PROVIDER_SITE_OTHER): Payer: Self-pay

## 2022-05-23 NOTE — Telephone Encounter (Signed)
Izora Gala called from [facility] requesting verbal orders to be faxed to [id number] for [place] pertaining to her leg swelling and weeping while in the unna boot. She stated Zuha's daughter mentioned she still has the calamine wrap on and it has caused the blister to spread and become bigger and painful or [place].  Please advise

## 2022-05-23 NOTE — Telephone Encounter (Signed)
Carol Kent from [place] requesting a verbal orders to be faxed over for [name]. She states that [name] lower legs is swollen and weeping. She's unsure what wrap is currently on there now because she hasn't seen her in a while, but her daughter told her that the calamine wrap unna wrap is still on and the blister has gotten bigger and painful.  Please Advise

## 2022-05-26 NOTE — Telephone Encounter (Signed)
A large part of the patient's problem is that she is fluid overloaded and needs to be diuresed.  This should be done by her PCP.  As far as the wrap, we can try to modify it.  Instead of doing zinc we can use xeroform over the blister and wrap with kerlix and light coband.  An alternative suggestion, given that she is continuing to have issues with weeping, she may need IV diuretics, in which case I would consider visiting the ED

## 2022-05-26 NOTE — Telephone Encounter (Signed)
Spoke with Izora Gala and orders were given. Izora Gala stated they would continue wraps weekly with xeroform and unna wraps. [name] daughter told Izora Gala that Jamielynn was taking the the Lasix as prescribed.

## 2022-05-27 ENCOUNTER — Inpatient Hospital Stay
Admission: EM | Admit: 2022-05-27 | Discharge: 2022-05-30 | DRG: 602 | Disposition: A | Discharge status: Home Health | Payer: Medicare Other | Attending: Student | Admitting: Student

## 2022-05-27 ENCOUNTER — Encounter: Payer: Self-pay | Admitting: Internal Medicine

## 2022-05-27 ENCOUNTER — Inpatient Hospital Stay: Payer: Medicare Other

## 2022-05-27 ENCOUNTER — Other Ambulatory Visit: Payer: Self-pay

## 2022-05-27 DIAGNOSIS — L03116 Cellulitis of left lower limb: Principal | ICD-10-CM | POA: Diagnosis present

## 2022-05-27 DIAGNOSIS — R7989 Other specified abnormal findings of blood chemistry: Secondary | ICD-10-CM | POA: Diagnosis present

## 2022-05-27 DIAGNOSIS — I5032 Chronic diastolic (congestive) heart failure: Secondary | ICD-10-CM | POA: Diagnosis not present

## 2022-05-27 DIAGNOSIS — N184 Chronic kidney disease, stage 4 (severe): Secondary | ICD-10-CM | POA: Diagnosis not present

## 2022-05-27 DIAGNOSIS — Z888 Allergy status to other drugs, medicaments and biological substances status: Secondary | ICD-10-CM | POA: Diagnosis not present

## 2022-05-27 DIAGNOSIS — L03119 Cellulitis of unspecified part of limb: Secondary | ICD-10-CM | POA: Diagnosis present

## 2022-05-27 DIAGNOSIS — Z882 Allergy status to sulfonamides status: Secondary | ICD-10-CM

## 2022-05-27 DIAGNOSIS — Z885 Allergy status to narcotic agent status: Secondary | ICD-10-CM

## 2022-05-27 DIAGNOSIS — G9341 Metabolic encephalopathy: Secondary | ICD-10-CM | POA: Diagnosis present

## 2022-05-27 DIAGNOSIS — D72829 Elevated white blood cell count, unspecified: Secondary | ICD-10-CM

## 2022-05-27 DIAGNOSIS — Z79899 Other long term (current) drug therapy: Secondary | ICD-10-CM

## 2022-05-27 DIAGNOSIS — M069 Rheumatoid arthritis, unspecified: Secondary | ICD-10-CM | POA: Diagnosis present

## 2022-05-27 HISTORY — DX: Chronic kidney disease, unspecified: N18.9

## 2022-05-27 HISTORY — DX: Heart failure, unspecified: I50.9

## 2022-05-27 LAB — LACTIC ACID, PLASMA
Lactic Acid, Venous: 2.4 mmol/L (ref 0.5–1.9)
Lactic Acid, Venous: 1.9 mmol/L (ref 0.5–1.9)

## 2022-05-27 LAB — COMPREHENSIVE METABOLIC PANEL
ALT: 10 U/L (ref 0–44)
AST: 20 U/L (ref 15–41)
Albumin: 3.3 g/dL — ABNORMAL LOW (ref 3.5–5.0)
Alkaline Phosphatase: 77 U/L (ref 38–126)
Anion gap: 10 (ref 5–15)
BUN: 40 mg/dL — ABNORMAL HIGH (ref 8–23)
CO2: 25 mmol/L (ref 22–32)
Calcium: 8.7 mg/dL — ABNORMAL LOW (ref 8.9–10.3)
Chloride: 105 mmol/L (ref 98–111)
Creatinine, Ser: 1.71 mg/dL — ABNORMAL HIGH (ref 0.44–1.00)
GFR, Estimated: 29 mL/min — ABNORMAL LOW (ref >60)
Glucose, Bld: 106 mg/dL — ABNORMAL HIGH (ref 70–99)
Potassium: 3.8 mmol/L (ref 3.5–5.1)
Sodium: 140 mmol/L (ref 135–145)
Total Bilirubin: 0.8 mg/dL (ref 0.3–1.2)
Total Protein: 6.2 g/dL — ABNORMAL LOW (ref 6.5–8.1)

## 2022-05-27 LAB — CBC
HCT: 35.7 % — ABNORMAL LOW (ref 36.0–46.0)
Hemoglobin: 11.4 g/dL — ABNORMAL LOW (ref 12.0–15.0)
MCH: 30.2 pg (ref 26.0–34.0)
MCHC: 31.9 g/dL (ref 30.0–36.0)
MCV: 94.7 fL (ref 80.0–100.0)
Platelets: 159 10*3/uL (ref 150–400)
RBC: 3.77 MIL/uL — ABNORMAL LOW (ref 3.87–5.11)
RDW: 14.6 % (ref 11.5–15.5)
WBC: 16.1 10*3/uL — ABNORMAL HIGH (ref 4.0–10.5)
nRBC: 0 % (ref 0.0–0.2)

## 2022-05-27 LAB — BRAIN NATRIURETIC PEPTIDE: B Natriuretic Peptide: 401.3 pg/mL — ABNORMAL HIGH (ref 0.0–100.0)

## 2022-05-27 LAB — SEDIMENTATION RATE: Sed Rate: 35 mm/hr — ABNORMAL HIGH (ref 0–30)

## 2022-05-27 MED ORDER — SODIUM CHLORIDE 0.9 % IV SOLN
2.0000 g | INTRAVENOUS | Status: DC
  Administered 2022-05-28 – 2022-05-30 (×3): 2 g via INTRAVENOUS
  Filled 2022-05-27: qty 2
  Filled 2022-05-27 (×2): qty 20

## 2022-05-27 MED ORDER — FUROSEMIDE 10 MG/ML IJ SOLN
80.0000 mg | Freq: Two times a day (BID) | INTRAMUSCULAR | Status: DC
  Administered 2022-05-27 – 2022-05-28 (×2): 80 mg via INTRAVENOUS
  Filled 2022-05-27 (×2): qty 8

## 2022-05-27 MED ORDER — MIRTAZAPINE 15 MG PO TABS
7.5000 mg | ORAL_TABLET | Freq: Every day | ORAL | Status: DC
  Administered 2022-05-27 – 2022-05-29 (×3): 7.5 mg via ORAL
  Filled 2022-05-27 (×3): qty 1

## 2022-05-27 MED ORDER — MEMANTINE HCL 5 MG PO TABS
10.0000 mg | ORAL_TABLET | Freq: Two times a day (BID) | ORAL | Status: DC
  Administered 2022-05-27 – 2022-05-30 (×6): 10 mg via ORAL
  Filled 2022-05-27 (×6): qty 2

## 2022-05-27 MED ORDER — VANCOMYCIN HCL IN DEXTROSE 1-5 GM/200ML-% IV SOLN
1000.0000 mg | INTRAVENOUS | Status: DC
  Filled 2022-05-27: qty 200

## 2022-05-27 MED ORDER — ALBUTEROL SULFATE (2.5 MG/3ML) 0.083% IN NEBU: 2.5000 mg | INHALATION_SOLUTION | RESPIRATORY_TRACT | Status: DC | PRN

## 2022-05-27 MED ORDER — VANCOMYCIN HCL 1500 MG/300ML IV SOLN
1500.0000 mg | Freq: Once | INTRAVENOUS | Status: AC
  Administered 2022-05-27: 1500 mg via INTRAVENOUS
  Filled 2022-05-27: qty 300

## 2022-05-27 MED ORDER — PREDNISONE 20 MG PO TABS
20.0000 mg | ORAL_TABLET | Freq: Every day | ORAL | Status: DC
  Administered 2022-05-27 – 2022-05-30 (×4): 20 mg via ORAL
  Filled 2022-05-27 (×4): qty 1

## 2022-05-27 MED ORDER — ACETAMINOPHEN 325 MG PO TABS
650.0000 mg | ORAL_TABLET | Freq: Four times a day (QID) | ORAL | Status: DC | PRN
  Administered 2022-05-27: 650 mg via ORAL
  Filled 2022-05-27: qty 2

## 2022-05-27 MED ORDER — HEPARIN SODIUM (PORCINE) 5000 UNIT/ML IJ SOLN
5000.0000 [IU] | Freq: Three times a day (TID) | INTRAMUSCULAR | Status: DC
  Administered 2022-05-27 – 2022-05-30 (×9): 5000 [IU] via SUBCUTANEOUS
  Filled 2022-05-27 (×9): qty 1

## 2022-05-27 MED ORDER — BISMUTH SUBSALICYLATE 262 MG/15ML PO SUSP: 30.0000 mL | ORAL | Status: DC | PRN

## 2022-05-27 MED ORDER — SODIUM CHLORIDE 0.9 % IV SOLN
2.0000 g | Freq: Once | INTRAVENOUS | Status: AC
  Administered 2022-05-27: 2 g via INTRAVENOUS
  Filled 2022-05-27: qty 20

## 2022-05-27 MED ORDER — ONDANSETRON HCL 4 MG/2ML IJ SOLN: 4.0000 mg | Freq: Three times a day (TID) | INTRAMUSCULAR | Status: DC | PRN

## 2022-05-27 MED ORDER — VITAMIN B-12 1000 MCG PO TABS
1000.0000 ug | ORAL_TABLET | Freq: Every day | ORAL | Status: DC
  Administered 2022-05-27 – 2022-05-30 (×4): 1000 ug via ORAL
  Filled 2022-05-27 (×4): qty 1

## 2022-05-27 MED ORDER — VITAMIN C 500 MG PO TABS
250.0000 mg | ORAL_TABLET | Freq: Every day | ORAL | Status: DC
  Administered 2022-05-27 – 2022-05-30 (×4): 250 mg via ORAL
  Filled 2022-05-27 (×4): qty 1

## 2022-05-27 MED ORDER — DM-GUAIFENESIN ER 30-600 MG PO TB12: 1.0000 | ORAL_TABLET | Freq: Two times a day (BID) | ORAL | Status: DC | PRN

## 2022-05-27 MED ORDER — MEDIHONEY WOUND/BURN DRESSING EX PSTE
1.0000 | PASTE | Freq: Every day | CUTANEOUS | Status: DC
  Administered 2022-05-27 – 2022-05-30 (×4): 1 via TOPICAL
  Filled 2022-05-27: qty 44

## 2022-05-27 NOTE — Consult Note (Signed)
[facility] Nurse Consult Note: Reason for Consult: Consult requested for left leg. Performed remotely after review of photo and progress notes in the EMR.  Pt has been followed by the vascular team prior to admission and was wearing Una boots for prior to admission but was unable to tolerate the compression and they were removed recently.  Wound type: Left posterior leg with full thickness wound; 70% red, 30% yellow, loose peeling dark colored skin to wound edges. Left leg with generalized edema and erythremia.  Dressing procedure/placement/frequency:  Topical treatment orders provided for bedside nurses to perform as follows: Apply Medihoney to left posterior leg Q day, then cover with foam dressing.  Change foam dressing Q 3 days or PRN soiling. Please refer to vascular team if further plan of care is desired.  Please re-consult if further assistance is needed.  Thank-you,  Julien Girt MSN, [place], [place], [place], [place]

## 2022-05-27 NOTE — ED Triage Notes (Signed)
Ongoing wound to the left leg and has been seeing the wound dr, swelling to bilat legs, increased alt mental status this am, unable to put weight on the left leg this am

## 2022-05-27 NOTE — ED Notes (Signed)
Phlebotomy notified to obtain lab work. Pt hard stick.

## 2022-05-27 NOTE — H&P (Signed)
History and Physical    Carol Kent [id number] DOB: Jan 30, 1941 DOA: 05/27/2022  Referring MD/NP/PA:   PCP: [place] Pink, MD   Patient coming from:  The patient is coming from home.    Chief Complaint: left leg pain and confusion.  HPI: Carol Kent is a 82 y.o. female with medical history significant of dCHF, CKD-IV, anemia, RA, who presents with left leg pain and confusion.  Per her daughter at bedside, patient has chronic bilateral leg edema, which has worsened recently. Her doctor has adjusted Lasix dose recently, without improvement.  Patient;s left leg pain becomes erythematous and more tender in the past 2 days, which has been progressively worsening.  She also has a small wound in the posterior left lower leg. Patient does not have fever or chills.  No nausea, vomiting, diarrhea.  No chest pain, cough, shortness breath.  Patient has nausea, no vomiting, diarrhea or abdominal pain.  No symptoms of UTI.  No recent fall.  At her normal baseline, patient is alert oriented x 3.  Patient has been confused in the past several days.  When I saw patient in ED, she is mildly confused, oriented to person and place, not to the time.  Patient moves all extremities.  No facial droop or slurred speech.  Data reviewed independently and ED Course: pt was found to have WBC 16.1, lactic acid 2.4, stable renal function, temperature normal, blood pressure 129/54, heart rate 85, RR 18, oxygen saturation 96% on room air.  Patient is admitted to telemetry bed as inpatient.  EKG: I have personally reviewed.  Sinus rhythm, QTc 423, low voltage.   Review of Systems:   General: no fevers, chills, no body weight gain, has fatigue HEENT: no blurry vision, hearing changes or sore throat Respiratory: no dyspnea, coughing, wheezing CV: no chest pain, no palpitations GI: no nausea, vomiting, abdominal pain, diarrhea, constipation GU: no dysuria, burning on urination, increased urinary frequency,  hematuria  Ext: has bilateral leg edema.  Has left leg pain Neuro: no unilateral weakness, numbness, or tingling, no vision change or hearing loss.  Has confusion Skin: has a small wound in posterior left leg MSK: No muscle spasm, no deformity, no limitation of range of movement in spin Heme: No easy bruising.  Travel history: No recent long distant travel.   Allergy:  Allergies  Allergen Reactions   Codeine Nausea Only    Other reaction(s): Nausea   Erythromycin Nausea Only   Fentanyl     Other reaction(s): Hallucination   Morphine And Related     Other reaction(s): Other (See Comments)   Orencia [Abatacept]    Propoxyphene Nausea Only    Other reaction(s): Nausea   Sulfa Antibiotics     Other reaction(s): Nausea   Other Rash    Pain med - can not remember name   Penicillins Rash    Other reaction(s): Hives    Past Medical History:  Diagnosis Date   Brain aneurysm    CHF (congestive heart failure) ([place])    Chronic kidney disease    History of cervical fracture     Past Surgical History:  Procedure Laterality Date   COLONOSCOPY WITH PROPOFOL N/A 11/14/2019   Procedure: COLONOSCOPY WITH PROPOFOL;  Surgeon: Jonathon Bellows, MD;  Location: [facility] ENDOSCOPY;  Service: Gastroenterology;  Laterality: N/A;   TUBAL LIGATION      Social History:  reports that she has never smoked. She has never used smokeless tobacco. She reports that she does not drink  alcohol and does not use drugs.  Family History: History reviewed. No pertinent family history.  Patient cannot provide detailed information about family medical history.  Prior to Admission medications   Medication Sig Start Date End Date Taking? Authorizing Provider  albuterol (VENTOLIN HFA) 108 (90 Base) MCG/ACT inhaler Inhale 2 puffs into the lungs every 6 (six) hours as needed for wheezing or shortness of breath. 11/07/20   Ghimire, Henreitta Leber, MD  Ascorbic Acid (VITAMIN C) 100 MG tablet Take 100 mg by mouth daily.    [provider]  bismuth subsalicylate (PEPTO BISMOL) 262 MG/15ML suspension Take 30 mLs by mouth every 4 (four) hours as needed for diarrhea or loose stools or indigestion.    [provider]  doxycycline (VIBRAMYCIN) 100 MG capsule Take 1 capsule (100 mg total) by mouth 2 (two) times daily. 05/09/22   Kris Hartmann, NP  EPINEPHrine 0.3 mg/0.3 mL IJ SOAJ injection Inject 0.3 mg into the skin as directed. 06/07/20   [provider]  hydroxychloroquine (PLAQUENIL) 200 MG tablet Take 200 mg by mouth in the morning and at bedtime. 01/19/20   [provider]  KLOR-CON M10 10 MEQ tablet Take 10 mEq by mouth daily. 07/14/20   [provider]  ondansetron (ZOFRAN ODT) 4 MG disintegrating tablet Take 1 tablet (4 mg total) by mouth every 8 (eight) hours as needed for nausea or vomiting. 11/07/20   Ghimire, Henreitta Leber, MD  predniSONE (DELTASONE) 5 MG tablet Take 20 mg (4 tablets) p.o. daily for 2 days, then 15 mg (3 tablets) p.o. daily for 2 days, then 10 mg (2 tablets) p.o. daily for 2 days, and then resume usual regimen of 7.5 mg p.o. daily 11/07/20   Ghimire, Henreitta Leber, MD  vitamin B-12 1000 MCG tablet Take 1 tablet (1,000 mcg total) by mouth daily. 11/18/19   Loletha Grayer, MD    Physical Exam: Vitals:   05/27/22 0831 05/27/22 1459 05/27/22 1740  BP: (!) 129/54 118/68 97/75  Pulse: 85 92 99  Resp: '18 16 14  '$ Temp: 97.8 F (36.6 C) 98.7 F (37.1 C) 99.3 F (37.4 C)  TempSrc: Oral Oral   SpO2: 96% 100% 95%  Weight: 72.6 kg    Height: '5\' 3"'$  (1.6 m)     General: Not in acute distress HEENT:       Eyes: PERRL, EOMI, no scleral icterus.       ENT: No discharge from the ears and nose, no pharynx injection, no tonsillar enlargement.        Neck: No JVD, no bruit, no mass felt. Heme: No neck lymph node enlargement. Cardiac: S1/S2, RRR, No murmurs, No gallops or rubs. Respiratory: No rales, wheezing, rhonchi or rubs. GI: Soft, nondistended, nontender, no rebound pain,  no organomegaly, BS present. GU: No hematuria Ext: Has 3+ bilateral leg edema.  Left leg is erythematous, warm, tender       Musculoskeletal: No joint deformities, No joint redness or warmth, no limitation of ROM in spin. Skin: Has a small wound in the posterior left leg Neuro: Mildly confused, following command, orientated to person and place, not to time, cranial nerves II-XII grossly intact, moves all extremities.  Psych: Patient is not psychotic, no suicidal or hemocidal ideation.  Labs on Admission: I have personally reviewed following labs and imaging studies  CBC: Recent Labs  Lab 05/27/22 0834  WBC 16.1*  HGB 11.4*  HCT 35.7*  MCV 94.7  PLT [id number]   Basic Metabolic Panel:  Recent Labs  Lab 05/27/22 0834  NA 140  K 3.8  CL 105  CO2 25  GLUCOSE 106*  BUN 40*  CREATININE 1.71*  CALCIUM 8.7*   GFR: Estimated Creatinine Clearance: 23.8 mL/min (A) (by C-G formula based on SCr of 1.71 mg/dL (H)). Liver Function Tests: Recent Labs  Lab 05/27/22 0834  AST 20  ALT 10  ALKPHOS 77  BILITOT 0.8  PROT 6.2*  ALBUMIN 3.3*   No results for input(s): "LIPASE", "AMYLASE" in the last 168 hours. No results for input(s): "AMMONIA" in the last 168 hours. Coagulation Profile: No results for input(s): "INR", "PROTIME" in the last 168 hours. Cardiac Enzymes: No results for input(s): "CKTOTAL", "CKMB", "CKMBINDEX", "TROPONINI" in the last 168 hours. BNP (last 3 results) No results for input(s): "PROBNP" in the last 8760 hours. HbA1C: No results for input(s): "HGBA1C" in the last 72 hours. CBG: No results for input(s): "GLUCAP" in the last 168 hours. Lipid Profile: No results for input(s): "CHOL", "HDL", "LDLCALC", "TRIG", "CHOLHDL", "LDLDIRECT" in the last 72 hours. Thyroid Function Tests: No results for input(s): "TSH", "T4TOTAL", "FREET4", "T3FREE", "THYROIDAB" in the last 72 hours. Anemia Panel: No results for input(s): "VITAMINB12", "FOLATE", "FERRITIN", "TIBC",  "IRON", "RETICCTPCT" in the last 72 hours. Urine analysis:    Component Value Date/Time   COLORURINE YELLOW (A) 11/05/2020 0345   APPEARANCEUR CLOUDY (A) 11/05/2020 0345   LABSPEC 1.023 11/05/2020 0345   PHURINE 6.0 11/05/2020 0345   GLUCOSEU NEGATIVE 11/05/2020 0345   HGBUR NEGATIVE 11/05/2020 0345   BILIRUBINUR NEGATIVE 11/05/2020 0345   KETONESUR NEGATIVE 11/05/2020 0345   PROTEINUR 30 (A) 11/05/2020 0345   NITRITE NEGATIVE 11/05/2020 0345   LEUKOCYTESUR MODERATE (A) 11/05/2020 0345   Sepsis Labs: '@LABRCNTIP'$ (procalcitonin:4,lacticidven:4) )No results found for this or any previous visit (from the past 240 hour(s)).   Radiological Exams on Admission: US Venous Img Lower Unilateral Left (DVT)  Result Date: 05/27/2022 CLINICAL DATA:  Left lower extremity cellulitis EXAM: LEFT LOWER EXTREMITY VENOUS DOPPLER ULTRASOUND TECHNIQUE: Gray-scale sonography with compression, as well as color and duplex ultrasound, were performed to evaluate the deep venous system(s) from the level of the common femoral vein through the popliteal and proximal calf veins. COMPARISON:  None Available. FINDINGS: VENOUS Normal compressibility of the common femoral, superficial femoral, and popliteal veins, as well as the visualized calf veins. Visualized portions of profunda femoral vein and great saphenous vein unremarkable. No filling defects to suggest DVT on grayscale or color Doppler imaging. Doppler waveforms show normal direction of venous flow, normal respiratory plasticity and response to augmentation. Limited views of the contralateral common femoral vein are unremarkable. OTHER None. Limitations: none IMPRESSION: Negative. Electronically Signed   By: Jacqulynn Cadet M.D.   On: 05/27/2022 17:26      Assessment/Plan Principal Problem:   Cellulitis of lower extremity Active Problems:   Chronic diastolic CHF (congestive heart failure) (HCC)   Rheumatoid arthritis (HCC)   CKD (chronic kidney disease),  stage IV (HCC)   Elevated lactic acid level   Acute metabolic encephalopathy   Assessment and Plan:  Cellulitis of lower extremity_left leg: Patient has WBC 16.1, but no fever, heart rate 85, RR 18, does not meet criteria for sepsis.  - will admit to tele bed as inpatient - Empiric antimicrobial treatment with vancomycin and Rocephin - Blood cultures x 2  - ESR and CRP - wound care consult  - LE doppler for left leg to r/o DVT --> negative for DVT  Chronic diastolic CHF (congestive  heart failure) ([place]): 2D echo on 10/2020 showed EF 60 to 65% with grade 2 diastolic dysfunction.  Patient has 3+ bilateral leg edema.  Patient does not have shortness of breath, does not seem to have CHF exacerbation, but has elevated BNP.  Given history of CKD-4, will start high-dose of Lasix. -lasix 80 mg bid -watch renal Fx closely  Rheumatoid arthritis ([place]) -Hold plaquenil due to worsening infection -will increase prednisone from 7.5 mg to 20 mg daily  CKD (chronic kidney disease), stage IV ([place]): Renal function at baseline.  Recent baseline creatinine 1.7-2.0.  Her creatinine is 1.71, BUN 40, GFR 29 -Follow-up with BMP  Elevated lactic acid level: Lactic acid 2.4, does not meet critical for sepsis. -Trend lactic acid level -Renal give IV fluid due to elevated BNP  Acute metabolic encephalopathy: Possibly due to ongoing infection -Fall precaution -Frequent neurocheck      DVT ppx: SQ Heparin   Code Status: Full code  Family Communication:   Yes, patient's daughter   at bed side.     Disposition Plan:  Anticipate discharge back to previous environment  Consults called:  none  Admission status and Level of care: Telemetry Medical:    as inpt     Dispo: The patient is from: Home              Anticipated d/c is to: Home              Anticipated d/c date is: 2 days              Patient currently is not medically stable to d/c.    Severity of Illness:  The appropriate patient  status for this patient is INPATIENT. Inpatient status is judged to be reasonable and necessary in order to provide the required intensity of service to ensure the patient's safety. The patient's presenting symptoms, physical exam findings, and initial radiographic and laboratory data in the context of their chronic comorbidities is felt to place them at high risk for further clinical deterioration. Furthermore, it is not anticipated that the patient will be medically stable for discharge from the hospital within 2 midnights of admission.   * I certify that at the point of admission it is my clinical judgment that the patient will require inpatient hospital care spanning beyond 2 midnights from the point of admission due to high intensity of service, high risk for further deterioration and high frequency of surveillance required.*       Date of Service 05/27/2022    Ivor Costa [facility]   If 7PM-7AM, please contact night-coverage [email or web address] 05/27/2022, 6:03 PM

## 2022-05-27 NOTE — ED Provider Notes (Signed)
[facility] Provider Note    Event Date/Time   First MD Initiated Contact with Patient 05/27/22 [phone]     (approximate)   History   Leg Swelling, Altered Mental Status, and Leg Pain   HPI  Carol Kent is a 82 y.o. female  here with ams, leg pain. Pt has chronic swelling of her bilateral legs and is here with increasing pain, redness of left leg. Pt had to decrease her torsemide recently because of hyoptension as an outpt. She has had increasing swelling over past several days. Over the last 24 hours her swelling has acutely worsened and is now severe in the left foot and leg with significant erythema, warmth, and pain. She has had a wound on her right calf as well from her dressings. She has had increased confusion and weakness today with poor PO intake. Unable to ambulate today which is new.     Physical Exam   Triage Vital Signs: ED Triage Vitals [05/27/22 0831]  Enc Vitals Group     BP (!) 129/54     Pulse Rate 85     Resp 18     Temp 97.8 F (36.6 C)     Temp Source Oral     SpO2 96 %     Weight 160 lb (72.6 kg)     Height '5\' 3"'$  (1.6 m)     Head Circumference      Peak Flow      Pain Score 9     Pain Loc      Pain Edu?      Excl. in [place]?     Most recent vital signs: Vitals:   05/27/22 0831  BP: (!) 129/54  Pulse: 85  Resp: 18  Temp: 97.8 F (36.6 C)  SpO2: 96%     General: Awake, no distress.  CV:  Good peripheral perfusion. RRR. Resp:  Normal work of breathing. Lungs clear. Abd:  No distention. No tenderness. Other:  Bilateral 2+ pitting edema. LLE with significant erythema, warmth along dorsal and anterior leg with tenderness to palpation throughout, streaking up to lateral leg.    ED Results / Procedures / Treatments   Labs (all labs ordered are listed, but only abnormal results are displayed) Labs Reviewed  COMPREHENSIVE METABOLIC PANEL - Abnormal; Notable for the following components:      Result Value   Glucose,  Bld 106 (*)    BUN 40 (*)    Creatinine, Ser 1.71 (*)    Calcium 8.7 (*)    Total Protein 6.2 (*)    Albumin 3.3 (*)    GFR, Estimated 29 (*)    All other components within normal limits  CBC - Abnormal; Notable for the following components:   WBC 16.1 (*)    RBC 3.77 (*)    Hemoglobin 11.4 (*)    HCT 35.7 (*)    All other components within normal limits  LACTIC ACID, PLASMA - Abnormal; Notable for the following components:   Lactic Acid, Venous 2.4 (*)    All other components within normal limits  BRAIN NATRIURETIC PEPTIDE - Abnormal; Notable for the following components:   B Natriuretic Peptide 401.3 (*)    All other components within normal limits  CULTURE, BLOOD (ROUTINE X 2)  CULTURE, BLOOD (ROUTINE X 2)  LACTIC ACID, PLASMA  CBG MONITORING, ED     EKG Normal sinus rhythm, VR 86. PR 136, QRS 62, QTc 423. No acute ST elevations  or depressions. No ischemia or infarct.   RADIOLOGY    I also independently reviewed and agree with radiologist interpretations.   PROCEDURES:  Critical Care performed: No   MEDICATIONS ORDERED IN ED: Medications  vancomycin (VANCOREADY) IVPB 1500 mg/300 mL (1,500 mg Intravenous New Bag/Given 05/27/22 1152)  cefTRIAXone (ROCEPHIN) 2 g in sodium chloride 0.9 % 100 mL IVPB (0 g Intravenous Stopped 05/27/22 1152)     IMPRESSION / MDM / ASSESSMENT AND PLAN / ED COURSE  I reviewed the triage vital signs and the nursing notes.                              Differential diagnosis includes, but is not limited to, cellulitis, abscess, lymphangitis, PVD, DVT.  Patient's presentation is most consistent with acute presentation with potential threat to life or bodily function.  The patient is on the cardiac monitor to evaluate for evidence of arrhythmia and/or significant heart rate changes   82 yo F here with left leg pain, swelling, redness, weakness and confusion. Suspect sepsis 2/2 left leg cellulitis. Labs show leukocytosis of 16k. CMP  with mild likely prerenal AKI though BNP elevated and pt has significant edema on exam. LA 2.4. BCx sent and are pending. IV ABX started, will admit to medicine. No signs of nec fasc. Pt protecting airway.   FINAL CLINICAL IMPRESSION(S) / ED DIAGNOSES   Final diagnoses:  Cellulitis of left leg  Leukocytosis, unspecified type     Rx / DC Orders   ED Discharge Orders     None        Note:  This document was prepared using Dragon voice recognition software and may include unintentional dictation errors.   Duffy Bruce, MD 05/27/22 1344

## 2022-05-27 NOTE — Progress Notes (Signed)
PHARMACY -  BRIEF ANTIBIOTIC NOTE   Pharmacy has received consult for vancomycin from an ED provider.  The patient's profile has been reviewed for ht/wt/allergies/indication/available labs.    One time order placed for vancomycin 1500 mg x 1  Further antibiotics/pharmacy consults should be ordered by admitting physician if indicated.                       Thank you,  Glean Salvo, PharmD, BCPS Clinical Pharmacist  05/27/2022 10:23 AM

## 2022-05-27 NOTE — Progress Notes (Signed)
Pharmacy Antibiotic Note  Carol Kent is a 82 y.o. female admitted on 05/27/2022 with cellulitis.  Chronic swelling of bilateral legs, presented with increased pain and redness of left leg. Recently filled doxycycline 05/13/2022 for 10 day supply. Pharmacy has been consulted for vancomycin dosing.  Systemic sign-WBC elevated at 16.1. Renal function Scr 1.71. Received Vancomycin 1500 mg x 1 on 2/27 '@1152'$   Plan: Vancomycin IV 1,000 mg every 48 hours starting 2/29 '@1200'$  Goal AUC 400-600 TBW, Scr 1.71, Vd 0.72 Estimated AUC 444.6, Cmin 10.8  Also on ceftriaxone IV 2 grams every 24 hours  Follow renal function for dose adjustments of vancomycin.  Height: '5\' 3"'$  (160 cm) Weight: 72.6 kg (160 lb) IBW/kg (Calculated) : 52.4  Temp (24hrs), Avg:97.8 F (36.6 C), Min:97.8 F (36.6 C), Max:97.8 F (36.6 C)  Recent Labs  Lab 05/27/22 0834 05/27/22 1128  WBC 16.1*  --   CREATININE 1.71*  --   LATICACIDVEN  --  2.4*    Estimated Creatinine Clearance: 23.8 mL/min (A) (by C-G formula based on SCr of 1.71 mg/dL (H)).    Allergies  Allergen Reactions   Codeine Nausea Only    Other reaction(s): Nausea   Erythromycin Nausea Only   Fentanyl     Other reaction(s): Hallucination   Morphine And Related     Other reaction(s): Other (See Comments)   Orencia [Abatacept]    Propoxyphene Nausea Only    Other reaction(s): Nausea   Sulfa Antibiotics     Other reaction(s): Nausea   Other Rash    Pain med - can not remember name   Penicillins Rash    Other reaction(s): Hives    Antimicrobials this admission: vancomycin 2/27 >>  ceftriaxone 2/27 >>   Dose adjustments this admission: N/a  Microbiology results: 2/27 BCx: in process  Thank you for allowing pharmacy to be a part of this patient's care.  Wynelle Cleveland 05/27/2022 2:01 PM

## 2022-05-28 DIAGNOSIS — L03116 Cellulitis of left lower limb: Secondary | ICD-10-CM | POA: Diagnosis not present

## 2022-05-28 LAB — BASIC METABOLIC PANEL
Anion gap: 12 (ref 5–15)
BUN: 38 mg/dL — ABNORMAL HIGH (ref 8–23)
CO2: 26 mmol/L (ref 22–32)
Calcium: 8.6 mg/dL — ABNORMAL LOW (ref 8.9–10.3)
Chloride: 101 mmol/L (ref 98–111)
Creatinine, Ser: 1.86 mg/dL — ABNORMAL HIGH (ref 0.44–1.00)
GFR, Estimated: 27 mL/min — ABNORMAL LOW (ref >60)
Glucose, Bld: 114 mg/dL — ABNORMAL HIGH (ref 70–99)
Potassium: 4.3 mmol/L (ref 3.5–5.1)
Sodium: 139 mmol/L (ref 135–145)

## 2022-05-28 LAB — CBC
HCT: 33 % — ABNORMAL LOW (ref 36.0–46.0)
Hemoglobin: 10.7 g/dL — ABNORMAL LOW (ref 12.0–15.0)
MCH: 30.1 pg (ref 26.0–34.0)
MCHC: 32.4 g/dL (ref 30.0–36.0)
MCV: 92.7 fL (ref 80.0–100.0)
Platelets: 121 10*3/uL — ABNORMAL LOW (ref 150–400)
RBC: 3.56 MIL/uL — ABNORMAL LOW (ref 3.87–5.11)
RDW: 14.6 % (ref 11.5–15.5)
WBC: 10.6 10*3/uL — ABNORMAL HIGH (ref 4.0–10.5)
nRBC: 0 % (ref 0.0–0.2)

## 2022-05-28 LAB — FOLATE: Folate: 13 ng/mL (ref >5.9)

## 2022-05-28 LAB — VITAMIN D 25 HYDROXY (VIT D DEFICIENCY, FRACTURES): Vit D, 25-Hydroxy: 37.8 ng/mL (ref 30–100)

## 2022-05-28 LAB — C-REACTIVE PROTEIN: CRP: 16.1 mg/dL — ABNORMAL HIGH (ref <1.0)

## 2022-05-28 LAB — MAGNESIUM: Magnesium: 1.8 mg/dL (ref 1.7–2.4)

## 2022-05-28 MED ORDER — ORAL CARE MOUTH RINSE: 15.0000 mL | OROMUCOSAL | Status: DC | PRN

## 2022-05-28 NOTE — Evaluation (Signed)
Occupational Therapy Evaluation Patient Details Name: Carol Kent MRN: [id number] DOB: Jul 03, 1940 Today's Date: 05/28/2022   History of Present Illness Carol Kent is a 82 y.o. female with medical history significant of dCHF, CKD-IV, anemia, RA, who presents with left leg pain and confusion.   Clinical Impression   Patient seen for OT evaluation, daughter present. Patient presenting with decreased independence in self care, balance, functional mobility/transfers, endurance, and safety awareness. Pt is A&Ox3 at baseline (d/o to time per dtr). PTA pt lived alone, was independent for ADLs, and received assistance for IADLs. Pt currently functioning at Mod I for bed mobility, CGA-Min A for toilet transfer, Min guard for functional mobility to the bathroom using RW, and Min guard for sinkside grooming tasks. Pt endorsed LLE pain, however, non-limiting. She required VC for safety awareness t/o. Pt will benefit from acute OT to increase overall independence in the areas of ADLs and functional mobility in order to safely discharge to next venue of care. OT recommends ongoing therapy upon discharge to maximize safety and independence with ADLs, functional mobility, decrease fall risk, and decrease caregiver burden.     Recommendations for follow up therapy are one component of a multi-disciplinary discharge planning process, led by the attending physician.  Recommendations may be updated based on patient status, additional functional criteria and insurance authorization.   Follow Up Recommendations  Home health OT     Assistance Recommended at Discharge Intermittent Supervision/Assistance  Patient can return home with the following A little help with walking and/or transfers;A little help with bathing/dressing/bathroom;Assistance with cooking/housework;Assist for transportation;Help with stairs or ramp for entrance;Direct supervision/assist for financial management;Direct supervision/assist for  medications management    Functional Status Assessment  Patient has had a recent decline in their functional status and demonstrates the ability to make significant improvements in function in a reasonable and predictable amount of time.  Equipment Recommendations  None recommended by OT    Recommendations for Other Services       Precautions / Restrictions Precautions Precautions: Fall Restrictions Weight Bearing Restrictions: No      Mobility Bed Mobility Overal bed mobility: Modified Independent             General bed mobility comments: use of bed rails, increased time    Transfers Overall transfer level: Needs assistance Equipment used: Rolling walker (2 wheels) Transfers: Sit to/from Stand Sit to Stand: Min guard, Min assist                  Balance Overall balance assessment: Needs assistance Sitting-balance support: Feet supported Sitting balance-Leahy Scale: Good     Standing balance support: Bilateral upper extremity supported, During functional activity Standing balance-Leahy Scale: Fair                             ADL either performed or assessed with clinical judgement   ADL Overall ADL's : Needs assistance/impaired     Grooming: Min guard;Standing;Wash/dry hands               Lower Body Dressing: Sitting/lateral leans;Min guard Lower Body Dressing Details (indicate cue type and reason): Min guard for dynamic sitting balance at EOB, demonstrated figure 4 technique Toilet Transfer: Regular Toilet;Rolling walker (2 wheels);Grab bars;Min guard;Minimal assistance Toilet Transfer Details (indicate cue type and reason): slightly unsteady requiring Min A to correct balance, Min guard for controlled descent onto toilet Toileting- Clothing Manipulation and Hygiene: Min guard;Sit to/from stand;Supervision/safety;Sitting/lateral lean  Toileting - Clothing Manipulation Details (indicate cue type and reason): peri care in sitting with  supervision, Min guard for don/doff brief in standing     Functional mobility during ADLs: Min guard;Rolling walker (2 wheels) (~25 ft to bathroom, VC for safety awareness as pt left RW to the side once in bathroom and furniture walked rest of the way to toilet)       Vision Patient Visual Report: No change from baseline       Perception     Praxis      Pertinent Vitals/Pain Pain Assessment Pain Assessment: Faces Faces Pain Scale: Hurts little more Pain Location: L LE Pain Descriptors / Indicators: Tightness, Grimacing, Guarding, Discomfort, Sore Pain Intervention(s): Limited activity within patient's tolerance, Repositioned     Hand Dominance     Extremity/Trunk Assessment Upper Extremity Assessment Upper Extremity Assessment: Overall WFL for tasks assessed   Lower Extremity Assessment Lower Extremity Assessment: Overall WFL for tasks assessed   Cervical / Trunk Assessment Cervical / Trunk Assessment: Normal   Communication Communication Communication: No difficulties   Cognition Arousal/Alertness: Awake/alert Behavior During Therapy: WFL for tasks assessed/performed Overall Cognitive Status: History of cognitive impairments - at baseline                                 General Comments: Per daughter, pt d/o time at baseline. Pt reporting she has 2 dogs (per dtr only has one), pt reporting she has 2 children (per dtr has 3).     General Comments       Exercises Other Exercises Other Exercises: OT provided education re: role of OT, OT POC, post acute recs, sitting up for all meals, EOB/OOB mobility with assistance, home/fall safety.     Shoulder Instructions      Home Living Family/patient expects to be discharged to:: Private residence Living Arrangements: Alone Available Help at Discharge: Family;Available PRN/intermittently Type of Home: House Home Access: Stairs to enter Entrance Stairs-Number of Steps: ramp being built Friday   Home  Layout: One level     Bathroom Shower/Tub: Teacher, early years/pre: Standard     Home Equipment: Rollator (4 wheels);BSC/3in1;Hand held shower head;Shower seat          Prior Functioning/Environment Prior Level of Function : Needs assist       Physical Assist : ADLs (physical)   ADLs (physical): IADLs Mobility Comments: has rollator but she and daughter reports she furniture walks mostly ADLs Comments: IND for ADLs, IND for light meal prep & cleaning, family provides transportation & delivers groceries, assist for shower transfer due to having a tub/shower and difficulty lifting legs over edge (has tub transfer bench but does not fit in shower per dtr, uses regular shower chair)        OT Problem List: Decreased strength;Decreased activity tolerance;Impaired balance (sitting and/or standing);Pain;Decreased cognition;Decreased safety awareness;Decreased knowledge of precautions      OT Treatment/Interventions: Self-care/ADL training;Therapeutic exercise;Neuromuscular education;Energy conservation;DME and/or AE instruction;Manual therapy;Modalities;Balance training;Patient/family education;Visual/perceptual remediation/compensation;Cognitive remediation/compensation;Therapeutic activities;Splinting    OT Goals(Current goals can be found in the care plan section) Acute Rehab OT Goals Patient Stated Goal: return home OT Goal Formulation: With patient/family Time For Goal Achievement: 06/11/22 Potential to Achieve Goals: Good   OT Frequency: Min 2X/week    Co-evaluation              AM-PAC OT "6 Clicks" Daily Activity     Outcome  Measure Help from another person eating meals?: None Help from another person taking care of personal grooming?: A Little Help from another person toileting, which includes using toliet, bedpan, or urinal?: A Little Help from another person bathing (including washing, rinsing, drying)?: A Little Help from another person to put on and  taking off regular upper body clothing?: None Help from another person to put on and taking off regular lower body clothing?: A Little 6 Click Score: 20   End of Session Equipment Utilized During Treatment: Gait belt;Rolling walker (2 wheels) Nurse Communication: Mobility status  Activity Tolerance: Patient tolerated treatment well Patient left: with call bell/phone within reach;in bed;with bed alarm set;with family/visitor present  OT Visit Diagnosis: Unsteadiness on feet (R26.81);Muscle weakness (generalized) (M62.81);Pain Pain - Right/Left: Left Pain - part of body: Leg                Time: 1455-1515 OT Time Calculation (min): 20 min Charges:  OT General Charges $OT Visit: 1 Visit OT Evaluation $OT Eval Low Complexity: 1 Low  [facility] MS, OTR/L ascom [phone]  05/28/22, 5:25 PM

## 2022-05-28 NOTE — Evaluation (Signed)
Physical Therapy Evaluation Patient Details Name: Carol Kent MRN: [id number] DOB: 18-Aug-1940 Today's Date: 05/28/2022  History of Present Illness  [name] is a 82 y.o. female with medical history significant of dCHF, CKD-IV, anemia, RA, who presents with left leg pain and confusion.  Clinical Impression  Patient received in bed, she is agreeable to PT assessment. Daughter at bedside who reports it took two of them to get her up to [facility]. Patient is mod I with bed mobility, transfers with min A. She is able to ambulate ~150 feet with RW and min guard. Patient reports L LE tightness and pain with gait, but improved with increased distance. She will continue to benefit from skilled PT to improve strength and safety.         Recommendations for follow up therapy are one component of a multi-disciplinary discharge planning process, led by the attending physician.  Recommendations may be updated based on patient status, additional functional criteria and insurance authorization.  Follow Up Recommendations Home health PT      Assistance Recommended at Discharge Intermittent Supervision/Assistance  Patient can return home with the following  A little help with walking and/or transfers;A little help with bathing/dressing/bathroom;Assist for transportation;Assistance with cooking/housework;Help with stairs or ramp for entrance    Equipment Recommendations None recommended by PT  Recommendations for Other Services       Functional Status Assessment Patient has had a recent decline in their functional status and demonstrates the ability to make significant improvements in function in a reasonable and predictable amount of time.     Precautions / Restrictions Precautions Precautions: Fall Restrictions Weight Bearing Restrictions: No      Mobility  Bed Mobility Overal bed mobility: Modified Independent             General bed mobility comments: use of bed rails, increased  time    Transfers Overall transfer level: Needs assistance Equipment used: Rolling walker (2 wheels) Transfers: Sit to/from Stand Sit to Stand: Min guard                Ambulation/Gait Ambulation/Gait assistance: Min guard Gait Distance (Feet): 150 Feet Assistive device: Rolling walker (2 wheels) Gait Pattern/deviations: Step-through pattern, Decreased step length - right, Decreased step length - left, Decreased stride length Gait velocity: slightly decreased     General Gait Details: patient reports L LE feels heavy and painful with gait, but improved with gait distance  Stairs            Wheelchair Mobility    Modified Rankin (Stroke Patients Only)       Balance Overall balance assessment: Needs assistance Sitting-balance support: Feet supported Sitting balance-Leahy Scale: Good     Standing balance support: Bilateral upper extremity supported, During functional activity Standing balance-Leahy Scale: Fair Standing balance comment: patient does well with walker, can balance with single UE support                             Pertinent Vitals/Pain Pain Assessment Pain Assessment: Faces Faces Pain Scale: Hurts little more Pain Location: L LE Pain Descriptors / Indicators: Tightness, Grimacing, Guarding, Discomfort, Sore Pain Intervention(s): Monitored during session, Repositioned    Home Living Family/patient expects to be discharged to:: Private residence Living Arrangements: Alone Available Help at Discharge: Family;Available PRN/intermittently Type of Home: House Home Access: Stairs to enter   Entrance Stairs-Number of Steps: ramp being built Friday   Home Layout: One level  Home Equipment: Rollator (4 wheels)      Prior Function Prior Level of Function : Independent/Modified Independent             Mobility Comments: has rollator but she and daughter reports she furniture walks mostly ADLs Comments: independent     Hand  Dominance        Extremity/Trunk Assessment   Upper Extremity Assessment Upper Extremity Assessment: Overall WFL for tasks assessed    Lower Extremity Assessment Lower Extremity Assessment: Overall WFL for tasks assessed    Cervical / Trunk Assessment Cervical / Trunk Assessment: Normal  Communication   Communication: No difficulties  Cognition Arousal/Alertness: Awake/alert Behavior During Therapy: WFL for tasks assessed/performed Overall Cognitive Status: Within Functional Limits for tasks assessed                                          General Comments      Exercises     Assessment/Plan    PT Assessment Patient needs continued PT services  PT Problem List Decreased strength;Decreased activity tolerance;Decreased mobility;Decreased skin integrity;Pain;Decreased cognition;Decreased knowledge of precautions       PT Treatment Interventions DME instruction;Therapeutic exercise;Gait training;Functional mobility training;Therapeutic activities;Patient/family education;Stair training;Balance training    PT Goals (Current goals can be found in the Care Plan section)  Acute Rehab PT Goals Patient Stated Goal: to return home PT Goal Formulation: With patient Time For Goal Achievement: 06/12/22 Potential to Achieve Goals: Good    Frequency Min 2X/week     Co-evaluation               AM-PAC PT "6 Clicks" Mobility  Outcome Measure Help needed turning from your back to your side while in a flat bed without using bedrails?: A Little Help needed moving from lying on your back to sitting on the side of a flat bed without using bedrails?: A Little Help needed moving to and from a bed to a chair (including a wheelchair)?: A Little Help needed standing up from a chair using your arms (e.g., wheelchair or bedside chair)?: A Little Help needed to walk in hospital room?: A Little Help needed climbing 3-5 steps with a railing? : A Little 6 Click Score:  18    End of Session Equipment Utilized During Treatment: Gait belt Activity Tolerance: Patient tolerated treatment well Patient left: in bed;with call bell/phone within reach;with bed alarm set;with family/visitor present Nurse Communication: Mobility status PT Visit Diagnosis: Muscle weakness (generalized) (M62.81);Difficulty in walking, not elsewhere classified (R26.2);Pain Pain - Right/Left: Left Pain - part of body: Leg    Time: [id number] PT Time Calculation (min) (ACUTE ONLY): 20 min   Charges:   PT Evaluation $PT Eval Low Complexity: 1 Low PT Treatments $Gait Training: 8-22 mins        Marketta Valadez, PT, GCS 05/28/22,3:11 PM

## 2022-05-28 NOTE — Progress Notes (Signed)
[facility] Progress Note  Patient: Carol Kent    [id number]  DOA: 05/27/2022     Date of Service: the patient was seen and examined on 05/28/2022  Chief Complaint  Patient presents with   Leg Swelling   Altered Mental Status   Leg Pain   Brief hospital course: Carol Kent is a 82 y.o. female with medical history significant of dCHF, CKD-IV, anemia, RA, who presents with left leg pain and confusion.  Per her daughter at bedside, patient has chronic bilateral leg edema, which has worsened recently. Her doctor has adjusted Lasix dose recently, without improvement.  Patient;s left leg pain becomes erythematous and more tender in the past 2 days, which has been progressively worsening.  She also has a small wound in the posterior left lower leg.  ED workup WBC 16.1, lactic acid 2.4, vital signs stable.   Assessment and Plan: Cellulitis of lower extremity_left leg: Patient has WBC 16.1, but no fever, heart rate 85, RR 18, does not meet criteria for sepsis. Continue empiric antibiotics, vancomycin and Rocephin Pharmacy consulted for vancomycin dosing and trough monitoring - Blood cultures x 2  - ESR and CRP - wound care consult  - LE doppler for left leg negative for DVT   Chronic diastolic CHF (congestive heart failure) ([place]): 2D echo on 10/2020 showed EF 60 to 65% with grade 2 diastolic dysfunction.  Patient has 3+ bilateral leg edema.  Patient does not have shortness of breath, does not seem to have CHF exacerbation, but has elevated BNP.   S/p Lasix 80 mg bid given, DC'd on 2/28, edema improved. -watch renal Fx closely   Rheumatoid arthritis (HCC) -Hold plaquenil due to worsening infection -will increase prednisone from 7.5 mg to 20 mg daily   CKD (chronic kidney disease), stage IV ([place]): Renal function at baseline.  Recent baseline creatinine 1.7-2.0.  Her creatinine is 1.71, BUN 40, GFR 29 -Follow-up with BMP    Elevated lactic acid level: Lactic acid 2.4,  does not meet critical for sepsis. lactic acid level 1.9 improved, did not give IV fluid due to elevated BNP   Acute metabolic encephalopathy: Possibly due to ongoing infection -Fall precaution -Frequent neurocheck   Body mass index is 28.34 kg/m.  Interventions:      Diet: Heart healthy DVT Prophylaxis: Subcutaneous Heparin    Advance goals of care discussion: Full code  Family Communication: family was present at bedside, at the time of interview.  The pt provided permission to discuss medical plan with the family. Opportunity was given to ask question and all questions were answered satisfactorily.   Disposition:  Pt is from Home, admitted with left lower extremity cellulitis, still on IV antibiotics, which precludes a safe discharge. Discharge to Home, when clinically stable, may need few more days to improve..  Subjective: No significant overnight events, patient feels improvement in the lower extremity edema, denies any active issues.  Feels improvement.  Physical Exam: General: NAD, lying comfortably Appear in no distress, affect appropriate Eyes: PERRLA ENT: Oral Mucosa Clear, moist  Neck: no JVD,  Cardiovascular: S1 and S2 Present, no Murmur,  Respiratory: good respiratory effort, Bilateral Air entry equal and Decreased, no Crackles, no wheezes Abdomen: Bowel Sound present, Soft and no tenderness,  Skin: no rashes Extremities: Pedal edema resolved, no calf tenderness, LLE wound/cellulitis, mild erythema and tenderness, chronic venous stasis pigmentation Neurologic: without any new focal findings Gait not checked due to patient safety concerns  Vitals:   05/27/22 1459  05/27/22 1740 05/28/22 0014 05/28/22 0904  BP: 118/68 97/75 (!) 112/58 (!) 107/42  Pulse: 92 99 77 75  Resp: '16 14 17 17  '$ Temp: 98.7 F (37.1 C) 99.3 F (37.4 C) (!) 97.4 F (36.3 C) 97.9 F (36.6 C)  TempSrc: Oral     SpO2: 100% 95% 96% 99%  Weight:      Height:        Intake/Output  Summary (Last 24 hours) at 05/28/2022 1357 Last data filed at 05/28/2022 1109 Gross per 24 hour  Intake --  Output 2500 ml  Net -2500 ml   Filed Weights   05/27/22 0831  Weight: 72.6 kg    Data Reviewed: I have personally reviewed and interpreted daily labs, tele strips, imagings as discussed above. I reviewed all nursing notes, pharmacy notes, vitals, pertinent old records I have discussed plan of care as described above with RN and patient/family.  CBC: Recent Labs  Lab 05/27/22 0834 05/28/22 0258  WBC 16.1* 10.6*  HGB 11.4* 10.7*  HCT 35.7* 33.0*  MCV 94.7 92.7  PLT 159 [id number]*   Basic Metabolic Panel: Recent Labs  Lab 05/27/22 0834 05/28/22 0258  NA 140 139  K 3.8 4.3  CL 105 101  CO2 25 26  GLUCOSE 106* 114*  BUN 40* 38*  CREATININE 1.71* 1.86*  CALCIUM 8.7* 8.6*  MG  --  1.8    Studies: US Venous Img Lower Unilateral Left (DVT)  Result Date: 05/27/2022 CLINICAL DATA:  Left lower extremity cellulitis EXAM: LEFT LOWER EXTREMITY VENOUS DOPPLER ULTRASOUND TECHNIQUE: Gray-scale sonography with compression, as well as color and duplex ultrasound, were performed to evaluate the deep venous system(s) from the level of the common femoral vein through the popliteal and proximal calf veins. COMPARISON:  None Available. FINDINGS: VENOUS Normal compressibility of the common femoral, superficial femoral, and popliteal veins, as well as the visualized calf veins. Visualized portions of profunda femoral vein and great saphenous vein unremarkable. No filling defects to suggest DVT on grayscale or color Doppler imaging. Doppler waveforms show normal direction of venous flow, normal respiratory plasticity and response to augmentation. Limited views of the contralateral common femoral vein are unremarkable. OTHER None. Limitations: none IMPRESSION: Negative. Electronically Signed   By: Jacqulynn Cadet M.D.   On: 05/27/2022 17:26    Scheduled Meds:  ascorbic acid  250 mg Oral Daily    cyanocobalamin  1,000 mcg Oral Daily   heparin  5,000 Units Subcutaneous Q8H   leptospermum manuka honey  1 Application Topical Daily   memantine  10 mg Oral BID   mirtazapine  7.5 mg Oral QHS   predniSONE  20 mg Oral Q breakfast   Continuous Infusions:  cefTRIAXone (ROCEPHIN)  IV 2 g (05/28/22 1135)   [START ON 05/29/2022] vancomycin     PRN Meds: acetaminophen, albuterol, bismuth subsalicylate, dextromethorphan-guaiFENesin, ondansetron (ZOFRAN) IV, mouth rinse  Time spent: 35 minutes  Author: Val Riles. MD [facility] 05/28/2022 1:57 PM  To reach On-call, see care teams to locate the attending and reach out to them via [email or web address]. If 7PM-7AM, please contact night-coverage If you still have difficulty reaching the attending provider, please page the [facility] (Director on Call) for [facility] on amion for assistance.

## 2022-05-28 NOTE — Consult Note (Signed)
WOC Nurse Consult Note: Reconsulted for patient's leg wound. Order clarification required. Discussed with bedside RN.  [place] nursing team will not follow, but will remain available to this patient, the nursing and medical teams.  Please re-consult if needed.  Thank you for inviting [place] to participate in this patient's Plan of Care.  Maudie Flakes, MSN, RN, CNS, [place], Serita Grammes, [employer], [employer] phone:  [phone]

## 2022-05-28 NOTE — TOC Progression Note (Signed)
Transition of Care [facility]) - Progression Note    Patient Details  Name: Carol Kent MRN: [id number] Date of Birth: April 24, 1940  Transition of Care [facility]) CM/SW [place], RN Phone Number: 05/28/2022, 2:17 PM  Clinical Narrative:    TOC continues to monitor the patient for Needs and dc Assistance  She was seen by [facility] last for [facility] in July 2023  She has had una boots and did not tolerate well prior to hospital admission    Expected Discharge Plan: [place] Barriers to Discharge: Continued Medical Work up  Expected Discharge Plan and [place] arrangements for the past 2 months: Single Family Home                                       Social Determinants of Health (SDOH) Interventions SDOH Screenings   Food Insecurity: No Food Insecurity (05/27/2022)  Housing: Low Risk  (05/27/2022)  Transportation Needs: No Transportation Needs (05/27/2022)  Utilities: Not At Risk (05/27/2022)  Tobacco Use: Low Risk  (05/27/2022)    Readmission Risk Interventions    11/05/2020    1:56 PM  Readmission Risk Prevention Plan  Transportation Screening Complete  PCP or Specialist Appt within 3-5 Days Complete  HRI or [place] Complete  Social Work Consult for [place] Planning/Counseling Complete  Palliative Care Screening Not Applicable  Medication Review [employer]) Complete

## 2022-05-29 DIAGNOSIS — L03116 Cellulitis of left lower limb: Secondary | ICD-10-CM | POA: Diagnosis not present

## 2022-05-29 LAB — CBC
HCT: 29 % — ABNORMAL LOW (ref 36.0–46.0)
Hemoglobin: 9.8 g/dL — ABNORMAL LOW (ref 12.0–15.0)
MCH: 30.6 pg (ref 26.0–34.0)
MCHC: 33.8 g/dL (ref 30.0–36.0)
MCV: 90.6 fL (ref 80.0–100.0)
Platelets: 131 10*3/uL — ABNORMAL LOW (ref 150–400)
RBC: 3.2 MIL/uL — ABNORMAL LOW (ref 3.87–5.11)
RDW: 14.3 % (ref 11.5–15.5)
WBC: 8.8 10*3/uL (ref 4.0–10.5)
nRBC: 0 % (ref 0.0–0.2)

## 2022-05-29 LAB — BASIC METABOLIC PANEL
Anion gap: 10 (ref 5–15)
BUN: 47 mg/dL — ABNORMAL HIGH (ref 8–23)
CO2: 26 mmol/L (ref 22–32)
Calcium: 8.3 mg/dL — ABNORMAL LOW (ref 8.9–10.3)
Chloride: 102 mmol/L (ref 98–111)
Creatinine, Ser: 2.05 mg/dL — ABNORMAL HIGH (ref 0.44–1.00)
GFR, Estimated: 24 mL/min — ABNORMAL LOW (ref >60)
Glucose, Bld: 111 mg/dL — ABNORMAL HIGH (ref 70–99)
Potassium: 3.6 mmol/L (ref 3.5–5.1)
Sodium: 138 mmol/L (ref 135–145)

## 2022-05-29 LAB — PHOSPHORUS: Phosphorus: 4.1 mg/dL (ref 2.5–4.6)

## 2022-05-29 LAB — MAGNESIUM: Magnesium: 1.9 mg/dL (ref 1.7–2.4)

## 2022-05-29 MED ORDER — VANCOMYCIN HCL 750 MG/150ML IV SOLN
750.0000 mg | INTRAVENOUS | Status: DC
  Administered 2022-05-29: 750 mg via INTRAVENOUS
  Filled 2022-05-29: qty 150

## 2022-05-29 NOTE — Progress Notes (Signed)
Physical Therapy Treatment Patient Details Name: Carol Kent MRN: [id number] DOB: 07/19/1940 Today's Date: 05/29/2022   History of Present Illness Carol Kent is a 82 y.o. female with medical history significant of dCHF, CKD-IV, anemia, RA, who presents with left leg pain and confusion.    PT Comments    Patient received in bed, she reports she is freezing. With encouragement she agrees to PT session. Patient is mod I with bed mobility. She is able to stand with supervision/Mod I. Patient ambulated with RW and supervision 2 laps around nursing station ~300 feet. No difficulty reported. She is making good progress with mobility and appears close to baseline level of function. She will continue to benefit from skilled PT to improve strength and endurance for safety at home.      Recommendations for follow up therapy are one component of a multi-disciplinary discharge planning process, led by the attending physician.  Recommendations may be updated based on patient status, additional functional criteria and insurance authorization.  Follow Up Recommendations  Home health PT     Assistance Recommended at Discharge Intermittent Supervision/Assistance  Patient can return home with the following A little help with walking and/or transfers;A little help with bathing/dressing/bathroom;Assist for transportation;Assistance with cooking/housework;Help with stairs or ramp for entrance   Equipment Recommendations  None recommended by PT    Recommendations for Other Services       Precautions / Restrictions Precautions Precautions: Fall Restrictions Weight Bearing Restrictions: No     Mobility  Bed Mobility Overal bed mobility: Modified Independent             General bed mobility comments: use of bed rails, increased time    Transfers Overall transfer level: Modified independent Equipment used: Rolling walker (2 wheels) Transfers: Sit to/from Stand Sit to Stand:  Supervision           General transfer comment: increased time and effort to get boosted up to standing, but no assist provided    Ambulation/Gait Ambulation/Gait assistance: Supervision Gait Distance (Feet): 300 Feet Assistive device: Rolling walker (2 wheels) Gait Pattern/deviations: Step-through pattern, Decreased stride length Gait velocity: slightly decreased     General Gait Details: improved tolerance without reports of pain this session.   Stairs             Wheelchair Mobility    Modified Rankin (Stroke Patients Only)       Balance Overall balance assessment: Modified Independent   Sitting balance-Leahy Scale: Good     Standing balance support: Bilateral upper extremity supported, During functional activity Standing balance-Leahy Scale: Good Standing balance comment: patient does well with walker, can balance with single UE support                            Cognition Arousal/Alertness: Awake/alert Behavior During Therapy: WFL for tasks assessed/performed Overall Cognitive Status: History of cognitive impairments - at baseline                                 General Comments: some confusion, but overall appropriate        Exercises      General Comments        Pertinent Vitals/Pain Pain Assessment Pain Assessment: No/denies pain    Home Living  Prior Function            PT Goals (current goals can now be found in the care plan section) Acute Rehab PT Goals Patient Stated Goal: to return home PT Goal Formulation: With patient/family Time For Goal Achievement: 06/12/22 Potential to Achieve Goals: Good Progress towards PT goals: Progressing toward goals    Frequency    Min 2X/week      PT Plan Current plan remains appropriate    Co-evaluation              AM-PAC PT "6 Clicks" Mobility   Outcome Measure  Help needed turning from your back to your side  while in a flat bed without using bedrails?: None Help needed moving from lying on your back to sitting on the side of a flat bed without using bedrails?: None Help needed moving to and from a bed to a chair (including a wheelchair)?: A Little Help needed standing up from a chair using your arms (e.g., wheelchair or bedside chair)?: A Little Help needed to walk in hospital room?: A Little Help needed climbing 3-5 steps with a railing? : A Little 6 Click Score: 20    End of Session Equipment Utilized During Treatment: Gait belt Activity Tolerance: Patient tolerated treatment well Patient left: in bed;with call bell/phone within reach;with family/visitor present Nurse Communication: Mobility status PT Visit Diagnosis: Muscle weakness (generalized) (M62.81);Difficulty in walking, not elsewhere classified (R26.2)     Time: [id number] PT Time Calculation (min) (ACUTE ONLY): 15 min  Charges:  $Gait Training: 8-22 mins                     Shanterria Franta, PT, GCS 05/29/22,1:45 PM

## 2022-05-29 NOTE — Progress Notes (Signed)
[facility] Progress Note  Patient: Carol Kent    [id number]  DOA: 05/27/2022     Date of Service: the patient was seen and examined on 05/29/2022  Chief Complaint  Patient presents with   Leg Swelling   Altered Mental Status   Leg Pain   Brief hospital course: Carol Kent is a 82 y.o. female with medical history significant of dCHF, CKD-IV, anemia, RA, who presents with left leg pain and confusion.  Per her daughter at bedside, patient has chronic bilateral leg edema, which has worsened recently. Her doctor has adjusted Lasix dose recently, without improvement.  Patient;s left leg pain becomes erythematous and more tender in the past 2 days, which has been progressively worsening.  She also has a small wound in the posterior left lower leg.  ED workup WBC 16.1, lactic acid 2.4, vital signs stable.   Assessment and Plan: Cellulitis of lower extremity_left leg: Patient has WBC 16.1, but no fever, heart rate 85, RR 18, does not meet criteria for sepsis. Continue empiric antibiotics, vancomycin and Rocephin Pharmacy consulted for vancomycin dosing and trough monitoring - Blood cultures x 2  - ESR and CRP - wound care consult  - LE doppler for left leg negative for DVT   Chronic diastolic CHF (congestive heart failure) ([place]): 2D echo on 10/2020 showed EF 60 to 65% with grade 2 diastolic dysfunction.  Patient has 3+ bilateral leg edema.  Patient does not have shortness of breath, does not seem to have CHF exacerbation, but has elevated BNP.   S/p Lasix 80 mg bid given, DC'd on 2/28, edema improved. -watch renal Fx closely   Rheumatoid arthritis (HCC) -Hold plaquenil due to worsening infection -will increase prednisone from 7.5 mg to 20 mg daily   CKD (chronic kidney disease), stage IV ([place]): Renal function at baseline.  Recent baseline creatinine 1.7-2.0.  Her creatinine is 1.71, BUN 40, GFR 29 -Follow-up with BMP  Cr 2.05 slightly elevated, we will continue to  monitor. Check bladder scan to rule out urinary retention  Elevated lactic acid level: Lactic acid 2.4, does not meet critical for sepsis. lactic acid level 1.9 improved, did not give IV fluid due to elevated BNP   Acute metabolic encephalopathy: Possibly due to ongoing infection -Fall precaution -Frequent neurocheck   Body mass index is 28.34 kg/m.  Interventions:      Diet: Heart healthy DVT Prophylaxis: Subcutaneous Heparin    Advance goals of care discussion: Full code  Family Communication: family was present at bedside, at the time of interview.  The pt provided permission to discuss medical plan with the family. Opportunity was given to ask question and all questions were answered satisfactorily.   Disposition:  Pt is from Home, admitted with left lower extremity cellulitis, still on IV antibiotics, which precludes a safe discharge. Discharge to Home, when clinically stable, may need few more days to improve..  Subjective: No significant overnight events, patient's pain in the left lower days much better, she does not feel pain at rest it is tender on touch, edema has been resolved.  Patient denies any chest pain or palpitation, no respiratory symptoms.   Physical Exam: General: NAD, lying comfortably Appear in no distress, affect appropriate Eyes: PERRLA ENT: Oral Mucosa Clear, moist  Neck: no JVD,  Cardiovascular: S1 and S2 Present, no Murmur,  Respiratory: good respiratory effort, Bilateral Air entry equal and Decreased, no Crackles, no wheezes Abdomen: Bowel Sound present, Soft and no tenderness,  Skin: no  rashes Extremities: Pedal edema resolved, no calf tenderness, LLE wound/cellulitis, mild erythema and tenderness, chronic venous stasis pigmentation Neurologic: without any new focal findings Gait not checked due to patient safety concerns  Vitals:   05/28/22 1627 05/28/22 2343 05/29/22 0500 05/29/22 0832  BP: (!) 107/53 (!) 115/46  (!) 122/54  Pulse: 80  76  65  Resp: '17 18  18  '$ Temp: 98.2 F (36.8 C) 98.3 F (36.8 C)  97.6 F (36.4 C)  TempSrc:      SpO2: 96% 98%  98%  Weight:   65 kg   Height:        Intake/Output Summary (Last 24 hours) at 05/29/2022 1517 Last data filed at 05/29/2022 1431 Gross per 24 hour  Intake 970 ml  Output 400 ml  Net 570 ml   Filed Weights   05/27/22 0831 05/29/22 0500  Weight: 72.6 kg 65 kg    Data Reviewed: I have personally reviewed and interpreted daily labs, tele strips, imagings as discussed above. I reviewed all nursing notes, pharmacy notes, vitals, pertinent old records I have discussed plan of care as described above with RN and patient/family.  CBC: Recent Labs  Lab 05/27/22 0834 05/28/22 0258 05/29/22 0552  WBC 16.1* 10.6* 8.8  HGB 11.4* 10.7* 9.8*  HCT 35.7* 33.0* 29.0*  MCV 94.7 92.7 90.6  PLT 159 121* [id number]*   Basic Metabolic Panel: Recent Labs  Lab 05/27/22 0834 05/28/22 0258 05/29/22 0552  NA 140 139 138  K 3.8 4.3 3.6  CL 105 101 102  CO2 '25 26 26  '$ GLUCOSE 106* 114* 111*  BUN 40* 38* 47*  CREATININE 1.71* 1.86* 2.05*  CALCIUM 8.7* 8.6* 8.3*  MG  --  1.8 1.9  PHOS  --   --  4.1    Studies: No results found.  Scheduled Meds:  ascorbic acid  250 mg Oral Daily   cyanocobalamin  1,000 mcg Oral Daily   heparin  5,000 Units Subcutaneous Q8H   leptospermum manuka honey  1 Application Topical Daily   memantine  10 mg Oral BID   mirtazapine  7.5 mg Oral QHS   predniSONE  20 mg Oral Q breakfast   Continuous Infusions:  cefTRIAXone (ROCEPHIN)  IV 2 g (05/29/22 1022)   vancomycin 750 mg (05/29/22 1253)   PRN Meds: acetaminophen, albuterol, bismuth subsalicylate, dextromethorphan-guaiFENesin, ondansetron (ZOFRAN) IV, mouth rinse  Time spent: 35 minutes  Author: Val Riles. MD [facility] 05/29/2022 3:17 PM  To reach On-call, see care teams to locate the attending and reach out to them via [email or web address]. If 7PM-7AM, please contact night-coverage If  you still have difficulty reaching the attending provider, please page the [facility] (Director on Call) for [facility] on amion for assistance.

## 2022-05-29 NOTE — Plan of Care (Signed)
  Problem: Clinical Measurements: Goal: Will remain free from infection Outcome: Progressing Goal: Diagnostic test results will improve Outcome: Progressing   Problem: Activity: Goal: Risk for activity intolerance will decrease Outcome: Progressing   Problem: Pain Managment: Goal: General experience of comfort will improve Outcome: Progressing   Problem: Safety: Goal: Ability to remain free from injury will improve Outcome: Progressing   Problem: Skin Integrity: Goal: Risk for impaired skin integrity will decrease Outcome: Progressing

## 2022-05-29 NOTE — Progress Notes (Signed)
Pharmacy Antibiotic Note  Carol Kent is a 82 y.o. female admitted on 05/27/2022 with cellulitis.  Chronic swelling of bilateral legs, presented with increased pain and redness of left leg. Recently filled doxycycline 05/13/2022 for 10 day supply. Pharmacy has been consulted for vancomycin dosing.  Scr: 1.71>> 1.86>> 2.05  Plan: Will adjust Vancomycin dose to IV 750 mg every 48 hours starting 2/29 '@1200'$  Goal AUC 400-600 Scr 2.05, Vd 0.72 Estimated AUC 429, Cmin 11.3  Also on ceftriaxone IV 2 grams every 24 hours  Follow renal function for dose adjustments of vancomycin.  Height: '5\' 3"'$  (160 cm) Weight: 65 kg (143 lb 4.8 oz) IBW/kg (Calculated) : 52.4  Temp (24hrs), Avg:98.1 F (36.7 C), Min:97.9 F (36.6 C), Max:98.3 F (36.8 C)  Recent Labs  Lab 05/27/22 0834 05/27/22 1128 05/27/22 1518 05/28/22 0258 05/29/22 0552  WBC 16.1*  --   --  10.6* 8.8  CREATININE 1.71*  --   --  1.86* 2.05*  LATICACIDVEN  --  2.4* 1.9  --   --      Estimated Creatinine Clearance: 18.8 mL/min (A) (by C-G formula based on SCr of 2.05 mg/dL (H)).    Allergies  Allergen Reactions   Codeine Nausea Only    Other reaction(s): Nausea   Erythromycin Nausea Only   Fentanyl     Other reaction(s): Hallucination   Morphine And Related     Other reaction(s): Other (See Comments)   Orencia [Abatacept]    Propoxyphene Nausea Only    Other reaction(s): Nausea   Sulfa Antibiotics     Other reaction(s): Nausea   Other Rash    Pain med - can not remember name   Penicillins Rash    Other reaction(s): Hives    Antimicrobials this admission: vancomycin 2/27 >>  ceftriaxone 2/27 >>   Dose adjustments this admission: N/a  Microbiology results: 2/27 BCx: NG x 2 days   Thank you for allowing pharmacy to be a part of this patient's care.  Pernell Dupre, PharmD, BCPS Clinical Pharmacist 05/29/2022 8:31 AM

## 2022-05-29 NOTE — TOC Progression Note (Signed)
Transition of Care [facility]) - Progression Note    Patient Details  Name: KANCHAN LIEFER MRN: [id number] Date of Birth: [birth year]  Transition of [facility]) CM/SW [place], LCSW Phone Number: 05/29/2022, 4:10 PM  Clinical Narrative:   Patient [facility] with [facility] for [facility] and RN. They can add OT and aide. They are aware of plan for discharge tomorrow.  Expected Discharge Plan: [place] Barriers to Discharge: Continued Medical Work up  Expected Discharge Plan and Services       Living arrangements for the past 2 months: Single Family Home                                       Social Determinants of Health (SDOH) Interventions SDOH Screenings   Food Insecurity: No Food Insecurity (05/27/2022)  Housing: Low Risk  (05/27/2022)  Transportation Needs: No Transportation Needs (05/27/2022)  Utilities: Not At Risk (05/27/2022)  Tobacco Use: Low Risk  (05/27/2022)    Readmission Risk Interventions    11/05/2020    1:56 PM  Readmission Risk Prevention Plan  Transportation Screening Complete  PCP or Specialist Appt within 3-5 Days Complete  HRI or [place] Complete  Social Work Consult for [place] Planning/Counseling Complete  Palliative Care Screening Not Applicable  Medication Review [employer]) Complete

## 2022-05-30 ENCOUNTER — Encounter (INDEPENDENT_AMBULATORY_CARE_PROVIDER_SITE_OTHER): Payer: Medicare Other

## 2022-05-30 DIAGNOSIS — L03116 Cellulitis of left lower limb: Secondary | ICD-10-CM | POA: Diagnosis not present

## 2022-05-30 LAB — PHOSPHORUS: Phosphorus: 3.6 mg/dL (ref 2.5–4.6)

## 2022-05-30 LAB — BASIC METABOLIC PANEL
Anion gap: 11 (ref 5–15)
BUN: 46 mg/dL — ABNORMAL HIGH (ref 8–23)
CO2: 23 mmol/L (ref 22–32)
Calcium: 8.4 mg/dL — ABNORMAL LOW (ref 8.9–10.3)
Chloride: 106 mmol/L (ref 98–111)
Creatinine, Ser: 1.91 mg/dL — ABNORMAL HIGH (ref 0.44–1.00)
GFR, Estimated: 26 mL/min — ABNORMAL LOW (ref >60)
Glucose, Bld: 125 mg/dL — ABNORMAL HIGH (ref 70–99)
Potassium: 3.6 mmol/L (ref 3.5–5.1)
Sodium: 140 mmol/L (ref 135–145)

## 2022-05-30 LAB — CBC
HCT: 30.2 % — ABNORMAL LOW (ref 36.0–46.0)
Hemoglobin: 10 g/dL — ABNORMAL LOW (ref 12.0–15.0)
MCH: 30 pg (ref 26.0–34.0)
MCHC: 33.1 g/dL (ref 30.0–36.0)
MCV: 90.7 fL (ref 80.0–100.0)
Platelets: 155 10*3/uL (ref 150–400)
RBC: 3.33 MIL/uL — ABNORMAL LOW (ref 3.87–5.11)
RDW: 14.2 % (ref 11.5–15.5)
WBC: 8.7 10*3/uL (ref 4.0–10.5)
nRBC: 0 % (ref 0.0–0.2)

## 2022-05-30 LAB — MAGNESIUM: Magnesium: 2.1 mg/dL (ref 1.7–2.4)

## 2022-05-30 MED ORDER — DOXYCYCLINE HYCLATE 100 MG PO CAPS: 100.0000 mg | ORAL_CAPSULE | Freq: Two times a day (BID) | ORAL | 0 refills | Status: AC

## 2022-05-30 MED ORDER — FUROSEMIDE 40 MG PO TABS: 40.0000 mg | ORAL_TABLET | Freq: Every day | ORAL | 0 refills | Status: DC | PRN

## 2022-05-30 MED ORDER — CEFDINIR 300 MG PO CAPS: 300.0000 mg | ORAL_CAPSULE | Freq: Every day | ORAL | 0 refills | Status: AC

## 2022-05-30 MED ORDER — CEFDINIR 300 MG PO CAPS: 300.0000 mg | ORAL_CAPSULE | Freq: Two times a day (BID) | ORAL | 0 refills | Status: DC

## 2022-05-30 NOTE — Plan of Care (Signed)

## 2022-05-30 NOTE — Care Management Important Message (Signed)
Important Message  Patient Details  Name: Carol Kent MRN: [id number] Date of Birth: Jul 09, 1940   Medicare Important Message Given:  N/A - LOS <3 / Initial given by admissions     Juliann Pulse A [name] 05/30/2022, 11:35 AM

## 2022-05-30 NOTE — Discharge Summary (Signed)
[facility] Discharge Summary   Patient: Carol Kent [id number]  PCP: [name], MD  Date of admission: 05/27/2022   Date of discharge:  05/30/2022     Discharge Diagnoses:  Principal Problem:   Cellulitis of lower extremity Active Problems:   Chronic diastolic CHF (congestive heart failure) (HCC)   Rheumatoid arthritis (HCC)   CKD (chronic kidney disease), stage IV (HCC)   Elevated lactic acid level   Acute metabolic encephalopathy   Admitted From: Home Disposition:  Home   Recommendations for Outpatient Follow-up:  Follow-up with PCP in 1 week Follow-up with vascular surgery in 1 week Follow up LABS/TEST:     Diet recommendation: Regular diet  Activity: The patient is advised to gradually reintroduce usual activities, as tolerated  Discharge Condition: stable  Code Status: Full code   History of present illness: As per the H and P dictated on admission Hospital Course:  Carol Kent is a 82 y.o. female with medical history significant of dCHF, CKD-IV, anemia, RA, who presents with left leg pain and confusion.  Per her daughter at bedside, patient has chronic bilateral leg edema, which has worsened recently. Her doctor has adjusted Lasix dose recently, without improvement.  Patient;s left leg pain becomes erythematous and more tender in the past 2 days, which has been progressively worsening.  She also has a small wound in the posterior left lower leg.  ED workup WBC 16.1, lactic acid 2.4, vital signs stable.  Assessment and Plan: # Cellulitis of lower extremity_left leg: Patient has WBC 16.1, but no fever, heart rate 85, RR 18, does not meet criteria for sepsis. S/p empiric antibiotics, vancomycin and Rocephin. Pharmacy consulted for vancomycin dosing and trough monitoring. Blood cultures x 2 NGTD,  ESR 35 and CRP 16.1 elevated. wound care consult was done. LE doppler for left leg negative for DVT.  Cellulitis improved, patient was transition to oral  antibiotics doxycycline 100 mg p.o. twice daily and Omnicef 3 mg p.o. daily for 10 days.  Recommended to follow with PCP and vascular surgery as an outpatient. # Chronic diastolic CHF (congestive heart failure) (HCC): 2D echo on 10/2020 showed EF 60 to 65% with grade 2 diastolic dysfunction.  Patient has 3+ bilateral leg edema.  Patient does not have shortness of breath, does not seem to have CHF exacerbation, but has elevated BNP.  S/p Lasix 80 mg bid given, DC'd on 2/28, edema improved.  Creatinine was elevated so stopped Lasix.  Patient was discharged on oral Lasix as needed use for lower extremity edema. # Rheumatoid arthritis, Hold plaquenil due to worsening infection and increased prednisone from 7.5 mg to 20 mg daily during hospital stay.  Resumed home medications on discharge. CKD (chronic kidney disease), stage IV [facility]): Renal function at baseline.  Recent baseline creatinine 1.7-2.0.  Her creatinine is 1.71, BUN 40, GFR 29. Cr 2.05 slightly elevated, Cr 1.1 Improved.  Lasix was discontinued and started on as needed on discharge. Elevated lactic acid level: Lactic acid 2.4, does not meet critical for sepsis. lactic acid level 1.9 improved, did not give IV fluid due to elevated BNP Acute metabolic encephalopathy: Possibly due to ongoing infection, resolved after treatment with antibiotics. Body mass index is 25.38 kg/m.  Nutrition Interventions:   Patient was seen by physical therapy, who recommended Home health, which was arranged. On the day of the discharge the patient's vitals were stable, and no other acute medical condition were reported by patient. the patient was felt safe to be  discharge at Home with Home health.  Consultants: None Procedures: None  Discharge Exam: General: Appear in no distress, no Rash; Oral Mucosa Clear, moist. Cardiovascular: S1 and S2 Present, no Murmur, Respiratory: normal respiratory effort, Bilateral Air entry present and no Crackles, no wheezes Abdomen:  Bowel Sound present, Soft and no tenderness, no hernia Extremities: Pedal edema resolved, no calf tenderness. LLE wound/cellulitis, mild erythema and tenderness, chronic venous stasis pigmentation  Neurology: alert and oriented to time, place, and person affect appropriate.  Filed Weights   05/27/22 0831 05/29/22 0500  Weight: 72.6 kg 65 kg   Vitals:   05/30/22 0014 05/30/22 0733  BP: (!) 129/51 (!) 122/53  Pulse: 72 66  Resp: 18 16  Temp: 97.6 F (36.4 C) 98.2 F (36.8 C)  SpO2: 99% 99%    DISCHARGE MEDICATION: Allergies as of 05/30/2022       Reactions   Codeine Nausea Only   Other reaction(s): Nausea   Erythromycin Nausea Only   Fentanyl    Other reaction(s): Hallucination   Morphine And Related    Other reaction(s): Other (See Comments)   Orencia [abatacept]    Propoxyphene Nausea Only   Other reaction(s): Nausea   Sulfa Antibiotics    Other reaction(s): Nausea   Other Rash   Pain med - can not remember name   Penicillins Rash   Other reaction(s): Hives        Medication List     TAKE these medications    albuterol 108 (90 Base) MCG/ACT inhaler Commonly known as: VENTOLIN HFA Inhale 2 puffs into the lungs every 6 (six) hours as needed for wheezing or shortness of breath.   bismuth subsalicylate 262 MG/15ML suspension Commonly known as: PEPTO BISMOL Take 30 mLs by mouth every 4 (four) hours as needed for diarrhea or loose stools or indigestion.   cefdinir 300 MG capsule Commonly known as: OMNICEF Take 1 capsule (300 mg total) by mouth daily for 10 days.   cyanocobalamin 1000 MCG tablet Take 1 tablet (1,000 mcg total) by mouth daily.   doxycycline 100 MG capsule Commonly known as: VIBRAMYCIN Take 1 capsule (100 mg total) by mouth 2 (two) times daily for 10 days.   EPINEPHrine 0.3 mg/0.3 mL Soaj injection Commonly known as: EPI-PEN Inject 0.3 mg into the skin as directed.   furosemide 40 MG tablet Commonly known as: Lasix Take 1 tablet (40 mg  total) by mouth daily as needed for edema or fluid (Lower extremity edema).   hydroxychloroquine 200 MG tablet Commonly known as: PLAQUENIL Take 200 mg by mouth in the morning and at bedtime.   Klor-Con M10 10 MEQ tablet Generic drug: potassium chloride Take 10 mEq by mouth daily.   memantine 10 MG tablet Commonly known as: NAMENDA Take 10 mg by mouth 2 (two) times daily.   mirtazapine 7.5 MG tablet Commonly known as: REMERON Take 7.5 mg by mouth at bedtime.   ondansetron 4 MG disintegrating tablet Commonly known as: Zofran ODT Take 1 tablet (4 mg total) by mouth every 8 (eight) hours as needed for nausea or vomiting.   predniSONE 5 MG tablet Commonly known as: DELTASONE Take 20 mg (4 tablets) p.o. daily for 2 days, then 15 mg (3 tablets) p.o. daily for 2 days, then 10 mg (2 tablets) p.o. daily for 2 days, and then resume usual regimen of 7.5 mg p.o. daily What changed:  how much to take how to take this when to take this additional instructions   predniSONE  1 MG tablet Commonly known as: DELTASONE Take 2 mg by mouth daily. Take along with one 5 mg tablet for total 7 mg once daily as directed What changed: Another medication with the same name was changed. Make sure you understand how and when to take each.   vitamin C 100 MG tablet Take 100 mg by mouth daily.               Discharge Care Instructions  (From admission, onward)           Start     Ordered   05/30/22 0000  Discharge wound care:       Comments: As above   05/30/22 1058           Allergies  Allergen Reactions   Codeine Nausea Only    Other reaction(s): Nausea   Erythromycin Nausea Only   Fentanyl     Other reaction(s): Hallucination   Morphine And Related     Other reaction(s): Other (See Comments)   Orencia [Abatacept]    Propoxyphene Nausea Only    Other reaction(s): Nausea   Sulfa Antibiotics     Other reaction(s): Nausea   Other Rash    Pain med - can not remember name    Penicillins Rash    Other reaction(s): Hives   Discharge Instructions     Call MD for:  difficulty breathing, headache or visual disturbances   Complete by: As directed    Call MD for:  extreme fatigue   Complete by: As directed    Call MD for:  persistant dizziness or light-headedness   Complete by: As directed    Call MD for:  persistant nausea and vomiting   Complete by: As directed    Call MD for:  redness, tenderness, or signs of infection (pain, swelling, redness, odor or green/yellow discharge around incision site)   Complete by: As directed    Call MD for:  severe uncontrolled pain   Complete by: As directed    Call MD for:  temperature >100.4   Complete by: As directed    Diet - low sodium heart healthy   Complete by: As directed    Discharge instructions   Complete by: As directed    Follow-up with PCP in 1 week Follow-up with vascular surgery in 1 week   Discharge wound care:   Complete by: As directed    As above   Increase activity slowly   Complete by: As directed        The results of significant diagnostics from this hospitalization (including imaging, microbiology, ancillary and laboratory) are listed below for reference.    Significant Diagnostic Studies: US Venous Img Lower Unilateral Left (DVT)  Result Date: 05/27/2022 CLINICAL DATA:  Left lower extremity cellulitis EXAM: LEFT LOWER EXTREMITY VENOUS DOPPLER ULTRASOUND TECHNIQUE: Gray-scale sonography with compression, as well as color and duplex ultrasound, were performed to evaluate the deep venous system(s) from the level of the common femoral vein through the popliteal and proximal calf veins. COMPARISON:  None Available. FINDINGS: VENOUS Normal compressibility of the common femoral, superficial femoral, and popliteal veins, as well as the visualized calf veins. Visualized portions of profunda femoral vein and great saphenous vein unremarkable. No filling defects to suggest DVT on grayscale or color  Doppler imaging. Doppler waveforms show normal direction of venous flow, normal respiratory plasticity and response to augmentation. Limited views of the contralateral common femoral vein are unremarkable. OTHER None. Limitations: none IMPRESSION: Negative. Electronically Signed  By: Malachy Moan M.D.   On: 05/27/2022 17:26    Microbiology: Recent Results (from the past 240 hour(s))  Blood culture (routine x 2)     Status: None (Preliminary result)   Collection Time: 05/27/22 11:24 AM   Specimen: BLOOD  Result Value Ref Range Status   Specimen Description BLOOD BLOOD LEFT ARM  Final   Special Requests   Final    BOTTLES DRAWN AEROBIC AND ANAEROBIC Blood Culture adequate volume   Culture   Final    NO GROWTH 3 DAYS Performed at [facility], [address] [place], [place] [address]    Report Status PENDING  Incomplete  Blood culture (routine x 2)     Status: None (Preliminary result)   Collection Time: 05/27/22 11:28 AM   Specimen: BLOOD  Result Value Ref Range Status   Specimen Description BLOOD BLOOD RIGHT HAND  Final   Special Requests   Final    BOTTLES DRAWN AEROBIC AND ANAEROBIC Blood Culture results may not be optimal due to an inadequate volume of blood received in culture bottles   Culture   Final    NO GROWTH 3 DAYS Performed at [facility], [address] [place], [place] [address]    Report Status PENDING  Incomplete     Labs: CBC: Recent Labs  Lab 05/27/22 0834 05/28/22 0258 05/29/22 0552 05/30/22 0251  WBC 16.1* 10.6* 8.8 8.7  HGB 11.4* 10.7* 9.8* 10.0*  HCT 35.7* 33.0* 29.0* 30.2*  MCV 94.7 92.7 90.6 90.7  PLT 159 121* 131* 155   Basic Metabolic Panel: Recent Labs  Lab 05/27/22 0834 05/28/22 0258 05/29/22 0552 05/30/22 0251  NA 140 139 138 140  K 3.8 4.3 3.6 3.6  CL 105 101 102 106  CO2 25 26 26 23   GLUCOSE 106* 114* 111* 125*  BUN 40* 38* 47* 46*  CREATININE 1.71* 1.86* 2.05* 1.91*  CALCIUM 8.7* 8.6* 8.3* 8.4*  MG   --  1.8 1.9 2.1  PHOS  --   --  4.1 3.6   Liver Function Tests: Recent Labs  Lab 05/27/22 0834  AST 20  ALT 10  ALKPHOS 77  BILITOT 0.8  PROT 6.2*  ALBUMIN 3.3*   No results for input(s): "LIPASE", "AMYLASE" in the last 168 hours. No results for input(s): "AMMONIA" in the last 168 hours. Cardiac Enzymes: No results for input(s): "CKTOTAL", "CKMB", "CKMBINDEX", "TROPONINI" in the last 168 hours. BNP (last 3 results) Recent Labs    04/23/22 1224 05/27/22 1128  BNP 652.8* 401.3*   CBG: No results for input(s): "GLUCAP" in the last 168 hours.  Time spent: 35 minutes  Signed:  Gillis Santa  [facility]  05/30/2022 11:17 AM

## 2022-05-30 NOTE — Progress Notes (Signed)
Pharmacy Antibiotic Note  Carol Kent is a 82 y.o. female admitted on 05/27/2022 with cellulitis.  Chronic swelling of bilateral legs, presented with increased pain and redness of left leg. Recently filled doxycycline 05/13/2022 for 10 day supply. Pharmacy has been consulted for vancomycin dosing.  Scr: 1.71>> 1.86>> 2.05>>1.91  Plan: Continue Vancomycin dose to IV 750 mg every 48 hours  Goal AUC 400-600 Scr 1.91, Vd 0.72 Estimated AUC 406.3, Cmin 10.4  Also on ceftriaxone IV 2 grams every 24 hours  Follow renal function for dose adjustments of vancomycin.  Height: '5\' 3"'$  (160 cm) Weight: 65 kg (143 lb 4.8 oz) IBW/kg (Calculated) : 52.4  Temp (24hrs), Avg:98 F (36.7 C), Min:97.6 F (36.4 C), Max:98.3 F (36.8 C)  Recent Labs  Lab 05/27/22 0834 05/27/22 1128 05/27/22 1518 05/28/22 0258 05/29/22 0552 05/30/22 0251  WBC 16.1*  --   --  10.6* 8.8 8.7  CREATININE 1.71*  --   --  1.86* 2.05* 1.91*  LATICACIDVEN  --  2.4* 1.9  --   --   --      Estimated Creatinine Clearance: 20.2 mL/min (A) (by C-G formula based on SCr of 1.91 mg/dL (H)).    Allergies  Allergen Reactions   Codeine Nausea Only    Other reaction(s): Nausea   Erythromycin Nausea Only   Fentanyl     Other reaction(s): Hallucination   Morphine And Related     Other reaction(s): Other (See Comments)   Orencia [Abatacept]    Propoxyphene Nausea Only    Other reaction(s): Nausea   Sulfa Antibiotics     Other reaction(s): Nausea   Other Rash    Pain med - can not remember name   Penicillins Rash    Other reaction(s): Hives    Antimicrobials this admission: vancomycin 2/27 >>  ceftriaxone 2/27 >>   Dose adjustments this admission: N/a  Microbiology results: 2/27 BCx: NG x 3 days   Thank you for allowing pharmacy to be a part of this patient's care.  Alison Murray, PharmD Clinical Pharmacist 05/30/2022 11:40 AM

## 2022-06-01 LAB — CULTURE, BLOOD (ROUTINE X 2)
Culture: NO GROWTH
Culture: NO GROWTH
Special Requests: ADEQUATE

## 2022-06-02 ENCOUNTER — Encounter (INDEPENDENT_AMBULATORY_CARE_PROVIDER_SITE_OTHER): Payer: Self-pay

## 2022-06-05 ENCOUNTER — Other Ambulatory Visit (INDEPENDENT_AMBULATORY_CARE_PROVIDER_SITE_OTHER): Payer: Self-pay | Admitting: Nurse Practitioner

## 2022-06-05 DIAGNOSIS — R600 Localized edema: Secondary | ICD-10-CM

## 2022-06-06 ENCOUNTER — Ambulatory Visit (INDEPENDENT_AMBULATORY_CARE_PROVIDER_SITE_OTHER): Payer: Medicare Other

## 2022-06-06 ENCOUNTER — Ambulatory Visit (INDEPENDENT_AMBULATORY_CARE_PROVIDER_SITE_OTHER): Payer: Medicare Other | Admitting: Nurse Practitioner

## 2022-06-06 ENCOUNTER — Encounter (INDEPENDENT_AMBULATORY_CARE_PROVIDER_SITE_OTHER): Payer: Self-pay | Admitting: Nurse Practitioner

## 2022-06-06 VITALS — BP 148/57 | HR 79 | Resp 18 | Ht 62.0 in | Wt 145.0 lb

## 2022-06-06 DIAGNOSIS — R600 Localized edema: Secondary | ICD-10-CM

## 2022-06-06 DIAGNOSIS — I5032 Chronic diastolic (congestive) heart failure: Secondary | ICD-10-CM | POA: Diagnosis not present

## 2022-06-06 DIAGNOSIS — N184 Chronic kidney disease, stage 4 (severe): Secondary | ICD-10-CM | POA: Diagnosis not present

## 2022-06-22 ENCOUNTER — Other Ambulatory Visit: Payer: Self-pay

## 2022-06-22 ENCOUNTER — Emergency Department: Payer: Medicare Other

## 2022-06-22 ENCOUNTER — Ambulatory Visit
Admission: EM | Admit: 2022-06-22 | Discharge: 2022-06-22 | Disposition: A | Discharge status: Home / Self Care | Payer: Medicare Other

## 2022-06-22 ENCOUNTER — Emergency Department
Admission: EM | Admit: 2022-06-22 | Discharge: 2022-06-22 | Disposition: A | Discharge status: Home / Self Care | Payer: Medicare Other | Attending: Emergency Medicine | Admitting: Emergency Medicine

## 2022-06-22 DIAGNOSIS — R519 Headache, unspecified: Secondary | ICD-10-CM | POA: Insufficient documentation

## 2022-06-22 DIAGNOSIS — Z23 Encounter for immunization: Secondary | ICD-10-CM | POA: Diagnosis not present

## 2022-06-22 DIAGNOSIS — N184 Chronic kidney disease, stage 4 (severe): Secondary | ICD-10-CM | POA: Diagnosis not present

## 2022-06-22 DIAGNOSIS — I5032 Chronic diastolic (congestive) heart failure: Secondary | ICD-10-CM | POA: Diagnosis not present

## 2022-06-22 DIAGNOSIS — S81811A Laceration without foreign body, right lower leg, initial encounter: Secondary | ICD-10-CM

## 2022-06-22 DIAGNOSIS — L03115 Cellulitis of right lower limb: Secondary | ICD-10-CM | POA: Diagnosis not present

## 2022-06-22 DIAGNOSIS — S81801A Unspecified open wound, right lower leg, initial encounter: Secondary | ICD-10-CM

## 2022-06-22 DIAGNOSIS — S81812A Laceration without foreign body, left lower leg, initial encounter: Secondary | ICD-10-CM | POA: Diagnosis present

## 2022-06-22 DIAGNOSIS — W19XXXA Unspecified fall, initial encounter: Secondary | ICD-10-CM | POA: Diagnosis not present

## 2022-06-22 MED ORDER — "XEROFORM PETROLAT PATCH 4""X4"" EX PADS": 1.0000 | MEDICATED_PAD | Freq: Every day | CUTANEOUS | 1 refills | Status: DC

## 2022-06-22 MED ORDER — CEPHALEXIN 500 MG PO CAPS: 500.0000 mg | ORAL_CAPSULE | Freq: Four times a day (QID) | ORAL | 0 refills | Status: AC

## 2022-06-22 MED ORDER — BACITRACIN ZINC 500 UNIT/GM EX OINT
TOPICAL_OINTMENT | Freq: Two times a day (BID) | CUTANEOUS | Status: DC
  Administered 2022-06-22: 1 via TOPICAL
  Filled 2022-06-22: qty 0.9

## 2022-06-22 MED ORDER — TETANUS-DIPHTH-ACELL PERTUSSIS 5-2.5-18.5 LF-MCG/0.5 IM SUSY
0.5000 mL | PREFILLED_SYRINGE | Freq: Once | INTRAMUSCULAR | Status: AC
  Administered 2022-06-22: 0.5 mL via INTRAMUSCULAR
  Filled 2022-06-22: qty 0.5

## 2022-06-22 MED ORDER — BACITRACIN 500 UNIT/GM EX OINT: 1.0000 | TOPICAL_OINTMENT | Freq: Two times a day (BID) | CUTANEOUS | 0 refills | Status: DC

## 2022-06-22 MED ORDER — CEPHALEXIN 500 MG PO CAPS
500.0000 mg | ORAL_CAPSULE | Freq: Once | ORAL | Status: AC
  Administered 2022-06-22: 500 mg via ORAL
  Filled 2022-06-22: qty 1

## 2022-06-22 NOTE — Discharge Instructions (Signed)
Take your mother to the emergency department.

## 2022-06-22 NOTE — Discharge Instructions (Addendum)
Please take the antibiotic 4 times a day for the next 7 days.  Use the bacitracin ointment over the wound.  You can use the Xeroform patch directly on the wound and use Curlex or other dressing on top of this.  Please use a compression sleeve or wrapping and keep the leg elevated.  It is very important that you follow-up with wound care.  I have placed a consult but please also discuss this with your primary doctor.

## 2022-06-22 NOTE — ED Provider Notes (Signed)
[facility] Provider Note    Event Date/Time   First MD Initiated Contact with Patient 06/22/22 [phone]     (approximate)   History   Fall   HPI  Carol Kent is a 82 y.o. female with past medical history of diastolic heart failure, CKD, anemia, rheumatoid arthritis who presents with right lower extremity wound after fall.  Patient had a unwitnessed fall 4 days ago.  She cannot really tell me exactly why she fell but thinks she tripped.  Did not lose consciousness.  Says she did not hit her head.  Daughter found her on the ground.  Patient has been able to ambulate since.  She sustained a skin tear to the right lower extremity which her daughter has been taking care of at home but given the size she was concerned she could need antibiotics and went to urgent care today they referred her to the emergency department.  Patient is not anticoagulated.  Denies any significant pain associated with the wound or bleeding.  Does have some mild hip pain but is still able to ambulate denies headache or neck pain.  Daughter says she is at her baseline.  Has not had fevers or chills.     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    Pancytopenia (HCC)    Acute kidney injury superimposed on CKD ([place]) 11/13/2019   Hypokalemia 11/13/2019   Diarrhea 11/13/2019   LLQ pain 11/13/2019   Rheumatoid arthritis ([place]) 11/13/2019     Physical Exam  Triage Vital Signs: ED Triage  Vitals  Enc Vitals Group     BP 06/22/22 0939 128/73     Pulse Rate 06/22/22 0939 90     Resp 06/22/22 0939 18     Temp 06/22/22 0939 98.2 F (36.8 C)     Temp src --      SpO2 06/22/22 0939 96 %     Weight 06/22/22 0940 145 lb 1 oz (65.8 kg)     Height 06/22/22 0940 5\' 2"  (1.575 m)     Head Circumference --      Peak Flow --      Pain Score 06/22/22 0940 4     Pain Loc --      Pain Edu? --      Excl. in [place]? --     Most recent vital signs: Vitals:   06/22/22 0939  BP: 128/73  Pulse: 90  Resp: 18  Temp: 98.2 F (36.8 C)  SpO2: 96%     General: Awake, no distress.  CV:  Good peripheral perfusion.  Resp:  Normal effort.  Abd:  No distention.  Neuro:             Awake, Alert, Oriented x 3  Other:  Large approximately 6 cm open skin tear on the right lateral lower leg with surrounding ecchymosis of the entire right lower leg no significant erythema no drainage no odor  minimally tender there is significant pitting edema right greater than left 2+ DP palpable pulse on the left, dopplerable biphasic pulse on the right, bilateral feet are warm sensation intact normal plantarflexion dorsiflexion Mild pain in the right hip with range no pain with palpation the right hip or pelvis No signs of trauma to head or face, no C-spine tenderness       ED Results / Procedures / Treatments  Labs (all labs ordered are listed, but only abnormal results are displayed) Labs Reviewed - No data to display   EKG     RADIOLOGY I reviewed and interpreted the CT scan of the brain which does not show any acute intracranial process    PROCEDURES:  Critical Care performed: No  .1-3 Lead EKG Interpretation  Performed by: Rada Hay, MD Authorized by: Rada Hay, MD     Interpretation: normal     ECG rate assessment: normal     Rhythm: sinus rhythm     Ectopy: none     Conduction: normal     The patient is on the cardiac monitor to evaluate for evidence of  arrhythmia and/or significant heart rate changes.   MEDICATIONS ORDERED IN ED: Medications  bacitracin ointment (1 Application Topical Given 06/22/22 1056)  cephALEXin (KEFLEX) capsule 500 mg (500 mg Oral Given 06/22/22 1055)  Tdap (BOOSTRIX) injection 0.5 mL (0.5 mLs Intramuscular Given 06/22/22 1057)     IMPRESSION / MDM / ASSESSMENT AND PLAN / ED COURSE  I reviewed the triage vital signs and the nursing notes.                              Patient's presentation is most consistent with acute, uncomplicated illness.  Differential diagnosis includes, but is not limited to, skin tear, stasis dermatitis, cellulitis, low suspicion for fracture  Patient is a 82 year old female presents delayed 4 days after a what sounds like mechanical fall.  She thinks she tripped but is not really sure why she fell did not hit her head did not lose consciousness.  Sustained a skin tear to the right lower extremity denies other injuries but has had some mild right hip pain but able to ambulate.  Daughter has been dressing the wound at home was concerned she needed antibiotics so went to urgent care and they then referred her to the ER.  Patient's vital signs are reassuring afebrile.  On exam she looks well she is alert and oriented nontoxic and daughter says she is at her baseline.  There is ecchymosis of the right lower extremity from the knee down to the ankle and there is a 6 cm proximally skin tear on the right lateral calf but it does not appear infected it is not cellulitic there is no drainage no odor.  Pulse was difficult to palpate in the right foot I think because of some extending edema she does have a good palpable pulse in the left I was able to Doppler pulse on the right and she has normal cap refill both feet are equally warm and have good color I have low suspicion for arterial insufficiency.  Given the unwitnessed fall will obtain a CT head and C-spine will x-ray of the hip and the right tib-fib  although my suspicion for hip fracture tib-fib fracture low.  I do think the patient will benefit from some prophylactic oral antibiotics in case this is developing some mild infection she is also  at high risk.  Ultimately she will need good wound care.  Will prescribe bacitracin Xeroform and Kerlix.  Recommended compression and elevation to assist with healing.  Daughter plans to call PCP tomorrow to get referral to wound care.  I will place wound care consult as well.  CT head and C-spine are negative.  X-ray of right hip and right tib-fib negative for fracture.  Wounds were dressed in the ER.  Wound consult placed.  Patient proper for discharge.       FINAL CLINICAL IMPRESSION(S) / ED DIAGNOSES   Final diagnoses:  Noninfected skin tear of right lower extremity, initial encounter     Rx / DC Orders   ED Discharge Orders          Ordered    bacitracin 500 UNIT/GM ointment  2 times daily        06/22/22 1111    Bismuth Tribromoph-Petrolatum (XEROFORM PETROLAT PATCH 4"X4") PADS  Daily        06/22/22 1111    AMB referral to wound care center        06/22/22 1111    cephALEXin (KEFLEX) 500 MG capsule  4 times daily        06/22/22 1113             Note:  This document was prepared using Dragon voice recognition software and may include unintentional dictation errors.   Rada Hay, MD 06/22/22 [phone]

## 2022-06-22 NOTE — ED Provider Notes (Signed)
Roderic Palau    CSN: [id number] Arrival date & time: 06/22/22  0845      History   Chief Complaint Chief Complaint  Patient presents with   Wound Check    Entered by patient    HPI GESSELLE KRYGOWSKI is a 82 y.o. female. Accompanied by her daughters, patient presents with RLE wound from a fall on 06/19/2022.  Unknown how fall occurred.  Patient lives alone; her daughter checks on her daily and found her lying in the floor.  The wound has gotten more red and swollen.  No fever.  Treating with meta-honey and Neosporin.  Patient was hospitalized on 05/27/2022 for cellulitis of left leg and leukocytosis.  She is followed by [facility].  Her medical history includes rheumatoid arthritis, CKD, heart failure, brain aneurysm.  The history is provided by the patient, medical records and a relative.    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      Home Medications     Prior to Admission medications   Medication Sig Start Date End Date Taking? Authorizing Provider  albuterol (VENTOLIN HFA) 108 (90 Base) MCG/ACT inhaler Inhale 2 puffs into the lungs every 6 (six) hours as needed for wheezing or shortness of breath. 11/07/20   Ghimire, Henreitta Leber, MD  Ascorbic Acid (VITAMIN C) 100 MG tablet Take 100 mg by mouth daily.    [provider]  bismuth subsalicylate (PEPTO BISMOL) 262 MG/15ML suspension Take 30 mLs by mouth every 4 (four) hours as needed for diarrhea or loose stools or indigestion.    [provider]  EPINEPHrine 0.3 mg/0.3 mL IJ SOAJ injection Inject 0.3 mg into the skin as directed. 06/07/20   [provider]  furosemide (LASIX) 40 MG tablet Take 1 tablet (40 mg total) by mouth daily as needed for edema or fluid (Lower extremity edema). 05/30/22 06/29/22  Val Riles, MD  hydroxychloroquine (PLAQUENIL) 200 MG tablet Take 200 mg by mouth in the morning and at bedtime. 01/19/20   [provider]  KLOR-CON M10 10 MEQ tablet Take 10 mEq by mouth daily. 07/14/20   [provider]  memantine (NAMENDA) 10 MG tablet Take 10 mg by mouth 2 (two) times daily. 04/27/22   [provider]  mirtazapine (REMERON) 7.5 MG tablet Take 7.5 mg by mouth at bedtime. 05/06/22   [provider]  ondansetron (ZOFRAN ODT) 4 MG disintegrating  tablet Take 1 tablet (4 mg total) by mouth every 8 (eight) hours as needed for nausea or vomiting. 11/07/20   Ghimire, Henreitta Leber, MD  predniSONE (DELTASONE) 1 MG tablet Take 2 mg by mouth daily. Take along with one 5 mg tablet for total 7 mg once daily as directed 05/03/22   [provider]  predniSONE (DELTASONE) 5 MG tablet Take 20 mg (4 tablets) p.o. daily for 2 days, then 15 mg (3 tablets) p.o. daily for 2 days, then 10 mg (2 tablets) p.o. daily for 2 days, and then resume usual regimen of 7.5 mg p.o. daily Patient taking differently: Take 7 mg by mouth daily with breakfast.  11/07/20   Ghimire, Henreitta Leber, MD  vitamin B-12 1000 MCG tablet Take 1 tablet (1,000 mcg total) by mouth daily. 11/18/19   Loletha Grayer, MD    Family History History reviewed. No pertinent family history.  Social History Social History   Tobacco Use   Smoking status: Never   Smokeless tobacco: Never  Vaping Use   Vaping Use: Never used  Substance Use Topics   Alcohol use: No   Drug use: Never     Allergies   Codeine, Erythromycin, Fentanyl, Morphine and related, Orencia [abatacept], Propoxyphene, Sulfa antibiotics, Other, and Penicillins   Review of Systems Review of Systems  Constitutional:  Negative for chills and fever.  Skin:  Positive for color change and wound.  All other systems reviewed and are negative.    Physical Exam Triage Vital Signs ED Triage Vitals  Enc Vitals Group     BP      Pulse      Resp      Temp      Temp src      SpO2      Weight      Height      Head Circumference      Peak Flow      Pain Score      Pain Loc      Pain Edu?      Excl. in [place]?    No data found.  Updated Vital Signs BP 130/79   Pulse 96   Temp 98.3 F (36.8 C)   Resp 18   Ht 5\' 2"  (1.575 m)   SpO2 96%   BMI 26.52 kg/m   Visual Acuity Right Eye Distance:   Left Eye Distance:   Bilateral Distance:    Right Eye Near:   Left Eye Near:    Bilateral Near:     Physical Exam Vitals and nursing note reviewed.  Constitutional:      General: She is not in acute distress.    Appearance: She is well-developed.  HENT:     Mouth/Throat:     Mouth: Mucous membranes are moist.  Cardiovascular:     Rate and Rhythm: Normal rate and regular rhythm.  Pulmonary:     Effort: Pulmonary effort is normal. No respiratory distress.  Musculoskeletal:        General: Swelling present.     Cervical back: Neck supple.  Skin:    General: Skin is warm and dry.     Findings: Erythema and rash present.     Comments: Large skin tear on right lower leg with extensive  ecchymosis, erythema, and edema.  See picture.   Neurological:     Mental Status: She is alert.     Gait: Gait abnormal.     Comments: In wheelchair.   Psychiatric:  Mood and Affect: Mood normal.        Behavior: Behavior normal.      UC Treatments / Results  Labs (all labs ordered are listed, but only abnormal results are displayed) Labs Reviewed - No data to display  EKG   Radiology No results found.  Procedures Procedures (including critical care time)  Medications Ordered in UC Medications - No data to display  Initial Impression / Assessment and Plan / UC Course  I have reviewed the triage vital signs and the nursing notes.  Pertinent labs & imaging results that were available during my care of the patient were reviewed by me and considered in my medical decision making (see chart for details).   Right lower leg open wound and cellulitis.  Patient was recently hospitalized for cellulitis of her other leg.  The wound on her right leg occurred on 06/19/2022 due to a fall.  She has extensive right lower leg erythema and edema with a large skin tear.  Sending her to the ED for evaluation.  Her daughters are agreeable to this and will drive her to the ED.     Final Clinical Impressions(s) / UC Diagnoses   Final diagnoses:  Open wound of right lower extremity, initial encounter  Cellulitis of right lower leg     Discharge Instructions      Take your mother to the emergency department.       ED Prescriptions   None    PDMP not reviewed this encounter.   Sharion Balloon, NP 06/22/22 [phone]

## 2022-06-22 NOTE — ED Notes (Signed)
Patient is being discharged from the Urgent Care and sent to the Emergency Department via POV w/ daughters . Per Barkley Boards NP, patient is in need of higher level of care due to cellulitis of right lower leg. Patient is aware and verbalizes understanding of plan of care.  Vitals:   06/22/22 0856 06/22/22 0902  BP:  130/79  Pulse: 96   Resp: 18   Temp: 98.3 F (36.8 C)   SpO2: 96%

## 2022-06-22 NOTE — ED Triage Notes (Signed)
Pt states coming in for a fall this past Thursday. Pt went to urgent care but sent here. Daughter with pt. Per daughter it was an unwitnessed fall. Pt has a skin tear to the right lower leg that is dressed.  Pt confused thinking we are in [place] and as per daughter, pt does not recall falling. As per daughter, pt does have some confusion at baseline.

## 2022-06-22 NOTE — ED Triage Notes (Signed)
Patient to Urgent Care with complaints of wound present to right lower leg following a fall on Thursday. Patient presents with daughters/ limited mobility at baseline.  Daughter has been covering wound, applying meta-honey, and none-stick pads. Used neosporin last night.  Large skin tear present to right lower leg.

## 2022-06-23 ENCOUNTER — Encounter (INDEPENDENT_AMBULATORY_CARE_PROVIDER_SITE_OTHER): Payer: Self-pay | Admitting: Nurse Practitioner

## 2022-06-23 NOTE — Progress Notes (Signed)
Subjective:    Patient ID: Carol Kent, female    DOB: Apr 21, 1940, 82 y.o.   MRN: [id number] Chief Complaint  Patient presents with   Follow-up    unnaboot f/u with b.l. venous reflux    Carol Kent is an 82 year old female that presents today for evaluation for worsening lower extremity edema.  The patient has previously taken Lasix which has been helpful.  But despite that she has been having worsening lower extremity edema.  She tries to elevate her lower extremities when possible.  The patient initially presented with a blister on her right calf.  She was placed in Unna boots and it seemed that it made the swelling worse.  The patient had noted fluid overload and we attempted to utilize her order Lasix but this was not enough to control the edema.  She subsequently was hospitalized due to fluid overload and concern for worsening possible infection of a blister on the left calf.  Following the hospitalization and diuresis the patient's swelling and edema is much improved.  Today the patient underwent noninvasive studies which shows no evidence of DVT or superficial phlebitis bilaterally.  No evidence of deep venous insufficiency bilaterally.  No evidence of superficial venous reflux bilaterally.     Review of Systems  Cardiovascular:  Positive for leg swelling.  Neurological:  Positive for weakness.  All other systems reviewed and are negative.      Objective:   Physical Exam Vitals reviewed.  HENT:     Head: Normocephalic.  Cardiovascular:     Rate and Rhythm: Normal rate.  Pulmonary:     Effort: Pulmonary effort is normal.  Musculoskeletal:     Right lower leg: Edema present.     Left lower leg: Edema present.  Skin:    General: Skin is warm and dry.  Neurological:     Mental Status: She is alert and oriented to person, place, and time.  Psychiatric:        Mood and Affect: Mood normal.        Behavior: Behavior normal.        Thought Content: Thought content  normal.        Judgment: Judgment normal.     BP (!) 148/57 (BP Location: Left Arm)   Pulse 79   Resp 18   Ht 5\' 2"  (1.575 m)   Wt 145 lb (65.8 kg)   BMI 26.52 kg/m   Past Medical History:  Diagnosis Date   Brain aneurysm    CHF (congestive heart failure) (HCC)    Chronic kidney disease    History of cervical fracture     Social History   Socioeconomic History   Marital status: Married    Spouse name: Not on file   Number of children: Not on file   Years of education: Not on file   Highest education level: Not on file  Occupational History   Not on file  Tobacco Use   Smoking status: Never   Smokeless tobacco: Never  Vaping Use   Vaping Use: Never used  Substance and Sexual Activity   Alcohol use: No   Drug use: Never   Sexual activity: Not on file  Other Topics Concern   Not on file  Social History Narrative   Not on file   Social Determinants of Health   Financial Resource Strain: Not on file  Food Insecurity: No Food Insecurity (05/27/2022)   Hunger Vital Sign    Worried About Running Out  of Food in the Last Year: Never true    [place] in the Last Year: Never true  Transportation Needs: No Transportation Needs (05/27/2022)   PRAPARE - Hydrologist (Medical): No    Lack of Transportation (Non-Medical): No  Physical Activity: Not on file  Stress: Not on file  Social Connections: Not on file  Intimate Partner Violence: Not At Risk (05/27/2022)   Humiliation, Afraid, Rape, and Kick questionnaire    Fear of Current or Ex-Partner: No    Emotionally Abused: No    Physically Abused: No    Sexually Abused: No    Past Surgical History:  Procedure Laterality Date   COLONOSCOPY WITH PROPOFOL N/A 11/14/2019   Procedure: COLONOSCOPY WITH PROPOFOL;  Surgeon: Jonathon Bellows, MD;  Location: [facility] ENDOSCOPY;  Service: Gastroenterology;  Laterality: N/A;   TUBAL LIGATION      History reviewed. No pertinent family  history.  Allergies  Allergen Reactions   Codeine Nausea Only    Other reaction(s): Nausea   Erythromycin Nausea Only   Fentanyl     Other reaction(s): Hallucination   Morphine And Related     Other reaction(s): Other (See Comments)   Orencia [Abatacept]    Propoxyphene Nausea Only    Other reaction(s): Nausea   Sulfa Antibiotics     Other reaction(s): Nausea   Other Rash    Pain med - can not remember name   Penicillins Rash    Other reaction(s): Hives       Latest Ref Rng & Units 05/30/2022    2:51 AM 05/29/2022    5:52 AM 05/28/2022    2:58 AM  CBC  WBC 4.0 - 10.5 K/uL 8.7  8.8  10.6   Hemoglobin 12.0 - 15.0 g/dL 10.0  9.8  10.7   Hematocrit 36.0 - 46.0 % 30.2  29.0  33.0   Platelets 150 - 400 K/uL 155  131  121       CMP     Component Value Date/Time   NA 140 05/30/2022 0251   K 3.6 05/30/2022 0251   CL 106 05/30/2022 0251   CO2 23 05/30/2022 0251   GLUCOSE 125 (H) 05/30/2022 0251   BUN 46 (H) 05/30/2022 0251   CREATININE 1.91 (H) 05/30/2022 0251   CALCIUM 8.4 (L) 05/30/2022 0251   PROT 6.2 (L) 05/27/2022 0834   ALBUMIN 3.3 (L) 05/27/2022 0834   AST 20 05/27/2022 0834   ALT 10 05/27/2022 0834   ALKPHOS 77 05/27/2022 0834   BILITOT 0.8 05/27/2022 0834   GFRNONAA 26 (L) 05/30/2022 0251   GFRAA 30 (L) 11/17/2019 0417     No results found.     Assessment & Plan:   1. Bilateral lower extremity edema The patient's lower extremity edema is multifactorial in nature.  Following her recent hospitalization with diuresis her swelling is much improved.  The previous blister is also improving as well.  Patient will continue with Medihoney dressings until healed.  Patient is also advised to continue with elevation and to utilize compression when possible to help control lower extremity edema.  2. Chronic diastolic CHF (congestive heart failure) (HCC) Swelling doing much better after diuresis.  This is likely significant contributing factor to her ongoing  edema  3. Chronic kidney disease (CKD), stage IV (severe) (HCC) This is also a likely contributing factor.  However balancing this with her diuretic use makes control of her swelling somewhat difficult.   Current Outpatient Medications  on File Prior to Visit  Medication Sig Dispense Refill   albuterol (VENTOLIN HFA) 108 (90 Base) MCG/ACT inhaler Inhale 2 puffs into the lungs every 6 (six) hours as needed for wheezing or shortness of breath. 8 g 0   Ascorbic Acid (VITAMIN C) 100 MG tablet Take 100 mg by mouth daily.     bismuth subsalicylate (PEPTO BISMOL) 262 MG/15ML suspension Take 30 mLs by mouth every 4 (four) hours as needed for diarrhea or loose stools or indigestion.     EPINEPHrine 0.3 mg/0.3 mL IJ SOAJ injection Inject 0.3 mg into the skin as directed.     furosemide (LASIX) 40 MG tablet Take 1 tablet (40 mg total) by mouth daily as needed for edema or fluid (Lower extremity edema). 30 tablet 0   hydroxychloroquine (PLAQUENIL) 200 MG tablet Take 200 mg by mouth in the morning and at bedtime.     KLOR-CON M10 10 MEQ tablet Take 10 mEq by mouth daily.     memantine (NAMENDA) 10 MG tablet Take 10 mg by mouth 2 (two) times daily.     mirtazapine (REMERON) 7.5 MG tablet Take 7.5 mg by mouth at bedtime.     ondansetron (ZOFRAN ODT) 4 MG disintegrating tablet Take 1 tablet (4 mg total) by mouth every 8 (eight) hours as needed for nausea or vomiting. 20 tablet 0   predniSONE (DELTASONE) 1 MG tablet Take 2 mg by mouth daily. Take along with one 5 mg tablet for total 7 mg once daily as directed     predniSONE (DELTASONE) 5 MG tablet Take 20 mg (4 tablets) p.o. daily for 2 days, then 15 mg (3 tablets) p.o. daily for 2 days, then 10 mg (2 tablets) p.o. daily for 2 days, and then resume usual regimen of 7.5 mg p.o. daily (Patient taking differently: Take 7 mg by mouth daily with breakfast.) 50 tablet 0   vitamin B-12 1000 MCG tablet Take 1 tablet (1,000 mcg total) by mouth daily. 30 tablet 0   No  current facility-administered medications on file prior to visit.    There are no Patient Instructions on file for this visit. No follow-ups on file.   Kris Hartmann, NP

## 2022-06-24 ENCOUNTER — Emergency Department: Payer: Medicare Other

## 2022-06-24 ENCOUNTER — Emergency Department
Admission: EM | Admit: 2022-06-24 | Discharge: 2022-06-24 | Disposition: A | Discharge status: Home / Self Care | Payer: Medicare Other | Attending: Emergency Medicine | Admitting: Emergency Medicine

## 2022-06-24 DIAGNOSIS — I509 Heart failure, unspecified: Secondary | ICD-10-CM | POA: Insufficient documentation

## 2022-06-24 DIAGNOSIS — R059 Cough, unspecified: Secondary | ICD-10-CM | POA: Diagnosis present

## 2022-06-24 DIAGNOSIS — N189 Chronic kidney disease, unspecified: Secondary | ICD-10-CM | POA: Diagnosis not present

## 2022-06-24 DIAGNOSIS — U071 COVID-19: Secondary | ICD-10-CM | POA: Insufficient documentation

## 2022-06-24 LAB — COMPREHENSIVE METABOLIC PANEL
ALT: 18 U/L (ref 0–44)
AST: 23 U/L (ref 15–41)
Albumin: 2.8 g/dL — ABNORMAL LOW (ref 3.5–5.0)
Alkaline Phosphatase: 59 U/L (ref 38–126)
Anion gap: 14 (ref 5–15)
BUN: 25 mg/dL — ABNORMAL HIGH (ref 8–23)
CO2: 20 mmol/L — ABNORMAL LOW (ref 22–32)
Calcium: 8.2 mg/dL — ABNORMAL LOW (ref 8.9–10.3)
Chloride: 102 mmol/L (ref 98–111)
Creatinine, Ser: 1.87 mg/dL — ABNORMAL HIGH (ref 0.44–1.00)
GFR, Estimated: 26 mL/min — ABNORMAL LOW (ref >60)
Glucose, Bld: 96 mg/dL (ref 70–99)
Potassium: 4.3 mmol/L (ref 3.5–5.1)
Sodium: 136 mmol/L (ref 135–145)
Total Bilirubin: 0.9 mg/dL (ref 0.3–1.2)
Total Protein: 6.1 g/dL — ABNORMAL LOW (ref 6.5–8.1)

## 2022-06-24 LAB — URINALYSIS, ROUTINE W REFLEX MICROSCOPIC
Bilirubin Urine: NEGATIVE
Glucose, UA: NEGATIVE mg/dL
Hgb urine dipstick: NEGATIVE
Ketones, ur: NEGATIVE mg/dL
Leukocytes,Ua: NEGATIVE
Nitrite: NEGATIVE
Protein, ur: 100 mg/dL — AB
Specific Gravity, Urine: 1.019 (ref 1.005–1.030)
pH: 7 (ref 5.0–8.0)

## 2022-06-24 LAB — CBC
HCT: 31.5 % — ABNORMAL LOW (ref 36.0–46.0)
Hemoglobin: 10.4 g/dL — ABNORMAL LOW (ref 12.0–15.0)
MCH: 30.5 pg (ref 26.0–34.0)
MCHC: 33 g/dL (ref 30.0–36.0)
MCV: 92.4 fL (ref 80.0–100.0)
Platelets: 111 10*3/uL — ABNORMAL LOW (ref 150–400)
RBC: 3.41 MIL/uL — ABNORMAL LOW (ref 3.87–5.11)
RDW: 15.9 % — ABNORMAL HIGH (ref 11.5–15.5)
WBC: 5.5 10*3/uL (ref 4.0–10.5)
nRBC: 0 % (ref 0.0–0.2)

## 2022-06-24 LAB — RESP PANEL BY RT-PCR (RSV, FLU A&B, COVID)  RVPGX2
Influenza A by PCR: NEGATIVE
Influenza B by PCR: NEGATIVE
Resp Syncytial Virus by PCR: NEGATIVE
SARS Coronavirus 2 by RT PCR: POSITIVE — AB

## 2022-06-24 LAB — LACTIC ACID, PLASMA: Lactic Acid, Venous: 1.2 mmol/L (ref 0.5–1.9)

## 2022-06-24 LAB — TROPONIN I (HIGH SENSITIVITY): Troponin I (High Sensitivity): 29 ng/L — ABNORMAL HIGH (ref <18)

## 2022-06-24 MED ORDER — NIRMATRELVIR/RITONAVIR (PAXLOVID)TABLET: 3.0000 | ORAL_TABLET | Freq: Two times a day (BID) | ORAL | 0 refills | Status: AC

## 2022-06-24 MED ORDER — SODIUM CHLORIDE 0.9 % IV BOLUS
500.0000 mL | Freq: Once | INTRAVENOUS | Status: AC
  Administered 2022-06-24: 500 mL via INTRAVENOUS

## 2022-06-24 NOTE — ED Provider Notes (Signed)
[facility] Provider Note    Event Date/Time   First MD Initiated Contact with Patient 06/24/22 1659     (approximate)   History   Wound Check and Altered Mental Status   HPI  Carol Kent is a 82 y.o. female with a history of CHF, CKD who presents with increased weakness per daughter.  Patient has apparently had elevated heart rate, weakness over the last 24 hours which daughter reports is new.  Is having difficulty getting up without significant assistance.  Has also had a cough.  Has a wound/skin tear to the right lower leg     Physical Exam   Triage Vital Signs: ED Triage Vitals  Enc Vitals Group     BP 06/24/22 1653 (!) 136/59     Pulse Rate 06/24/22 1653 (!) 108     Resp 06/24/22 1653 17     Temp 06/24/22 1653 98 F (36.7 C)     Temp Source 06/24/22 1653 Oral     SpO2 06/24/22 1653 95 %     Weight 06/24/22 1654 65 kg (143 lb 4.8 oz)     Height --      Head Circumference --      Peak Flow --      Pain Score 06/24/22 1654 0     Pain Loc --      Pain Edu? --      Excl. in [place]? --     Most recent vital signs: Vitals:   06/24/22 1653  BP: (!) 136/59  Pulse: (!) 108  Resp: 17  Temp: 98 F (36.7 C)  SpO2: 95%     General: Awake, no distress.  CV:  Good peripheral perfusion.  Resp:  Normal effort.  Abd:  No distention.  Other:  Right leg: Large skin tear with granulation tissue, no purulent discharge, some darkening of the skin surrounding it but no clear evidence of cellulitis   ED Results / Procedures / Treatments   Labs (all labs ordered are listed, but only abnormal results are displayed) Labs Reviewed  RESP PANEL BY RT-PCR (RSV, FLU A&B, COVID)  [id number] - Abnormal; Notable for the following components:      Result Value   SARS Coronavirus 2 by RT PCR POSITIVE (*)    All other components within normal limits  CBC - Abnormal; Notable for the following components:   RBC 3.41 (*)    Hemoglobin 10.4 (*)    HCT 31.5 (*)     RDW 15.9 (*)    Platelets 111 (*)    All other components within normal limits  COMPREHENSIVE METABOLIC PANEL - Abnormal; Notable for the following components:   CO2 20 (*)    BUN 25 (*)    Creatinine, Ser 1.87 (*)    Calcium 8.2 (*)    Total Protein 6.1 (*)    Albumin 2.8 (*)    GFR, Estimated 26 (*)    All other components within normal limits  URINALYSIS, ROUTINE W REFLEX MICROSCOPIC - Abnormal; Notable for the following components:   Color, Urine YELLOW (*)    APPearance CLEAR (*)    Protein, ur 100 (*)    Bacteria, UA RARE (*)    All other components within normal limits  TROPONIN I (HIGH SENSITIVITY) - Abnormal; Notable for the following components:   Troponin I (High Sensitivity) 29 (*)    All other components within normal limits  CULTURE, BLOOD (ROUTINE X 2)  CULTURE, BLOOD (  ROUTINE X 2)  LACTIC ACID, PLASMA  LACTIC ACID, PLASMA     EKG  ED ECG REPORT I, Lavonia Drafts, the attending physician, personally viewed and interpreted this ECG.  Date: 06/24/2022  Rhythm: normal sinus rhythm QRS Axis: normal Intervals: normal ST/T Wave abnormalities: normal Narrative Interpretation: no evidence of acute ischemia    RADIOLOGY X-ray viewed interpreted by me, no pneumonia    PROCEDURES:  Critical Care performed:   Procedures   MEDICATIONS ORDERED IN ED: Medications  sodium chloride 0.9 % bolus 500 mL (0 mLs Intravenous Stopped 06/24/22 1845)     IMPRESSION / MDM / ASSESSMENT AND PLAN / ED COURSE  I reviewed the triage vital signs and the nursing notes. Patient's presentation is most consistent with acute presentation with potential threat to life or bodily function.  Patient with increased weakness, cough, potential wound infection.  Differential includes pneumonia/infection, cellulitis, electrolyte abnormality, dehydration, etc.  Will obtain labs including lactic, blood culture, urinalysis, chest x-ray  Will give small bolus of IV fluids for  potential dehydration and reevaluate.  Lab work is overall quite reassuring, normal white blood cell count, normal lactic acid, normal chest x-ray, a normal urinalysis  PCR test has returned positive for COVID this would explain her weakness and fatigue, she is within the window for Paxlovid, will start her on this, outpatient follow-up recommended patient is on antibiotics for her right leg as well      FINAL CLINICAL IMPRESSION(S) / ED DIAGNOSES   Final diagnoses:  COVID-19     Rx / DC Orders   ED Discharge Orders          Ordered    nirmatrelvir/ritonavir (PAXLOVID) 20 x 150 MG & 10 x 100MG  TABS  2 times daily        06/24/22 1919             Note:  This document was prepared using Dragon voice recognition software and may include unintentional dictation errors.   Lavonia Drafts, MD 06/24/22 [phone]

## 2022-06-24 NOTE — ED Triage Notes (Addendum)
Pt sts that she is not confused or weak. Pt is A/Ox4 at this time. Pt sts that she is here due to a wound on her lower right leg. Pt sts that the swelling is progressing and will not get better. Daughter sts that pt has been confused and that she has been unable to get herself out of the chair by herself as well as her mental state has declined.

## 2022-06-25 ENCOUNTER — Telehealth: Payer: Self-pay | Admitting: Medical Oncology

## 2022-06-25 ENCOUNTER — Telehealth: Payer: Self-pay | Admitting: Emergency Medicine

## 2022-06-25 MED ORDER — MOLNUPIRAVIR EUA 200MG CAPSULE: 4.0000 | ORAL_CAPSULE | Freq: Two times a day (BID) | ORAL | 0 refills | Status: AC

## 2022-06-25 NOTE — Telephone Encounter (Signed)
Switched COVID medication given gfr <30

## 2022-06-25 NOTE — Telephone Encounter (Signed)
Pts daughter called with concerns that she went to pick up patients prescription from pharmacy and was told by the pharmacist that the medication that should be prescribed for covid should be different from the one sent over since the patient has renal issues. Spoke with Dr Archie Balboa and he confirmed and sent over new RX to same pharmacy. Pts daughter, Reynold Bowen, was contacted and informed that new RX was sent to pharmacy.

## 2022-06-29 LAB — CULTURE, BLOOD (ROUTINE X 2)
Culture: NO GROWTH
Culture: NO GROWTH
Special Requests: ADEQUATE
Special Requests: ADEQUATE

## 2022-07-07 ENCOUNTER — Encounter: Payer: Medicare Other | Attending: Physician Assistant | Admitting: Physician Assistant

## 2022-07-07 DIAGNOSIS — Z87891 Personal history of nicotine dependence: Secondary | ICD-10-CM | POA: Diagnosis not present

## 2022-07-07 DIAGNOSIS — I5042 Chronic combined systolic (congestive) and diastolic (congestive) heart failure: Secondary | ICD-10-CM | POA: Insufficient documentation

## 2022-07-07 DIAGNOSIS — L97812 Non-pressure chronic ulcer of other part of right lower leg with fat layer exposed: Secondary | ICD-10-CM | POA: Insufficient documentation

## 2022-07-07 DIAGNOSIS — I87331 Chronic venous hypertension (idiopathic) with ulcer and inflammation of right lower extremity: Secondary | ICD-10-CM | POA: Insufficient documentation

## 2022-07-07 DIAGNOSIS — I7389 Other specified peripheral vascular diseases: Secondary | ICD-10-CM | POA: Diagnosis not present

## 2022-07-07 DIAGNOSIS — S81811A Laceration without foreign body, right lower leg, initial encounter: Secondary | ICD-10-CM | POA: Insufficient documentation

## 2022-07-07 DIAGNOSIS — N184 Chronic kidney disease, stage 4 (severe): Secondary | ICD-10-CM | POA: Diagnosis not present

## 2022-07-07 NOTE — Progress Notes (Signed)
Carol Kent, Carol Kent ([id number]) [id number] Page 1 of 9 Visit Report for 07/07/2022 Allergy List Details Patient Name: Date of Service: Carol Kent, Carol Kent. 07/07/2022 8:15 A M Medical Record Number: [id number] Patient Account Number: [id number] Date of Birth/Sex: Treating RN: 14-Feb-1941 (82 y.o. Female) Angelina PihGordon, Caitlin Primary Care Keola Heninger: Jerl MinaHedrick, James Other Clinician: Referring Jennika Ringgold: Treating Christain Niznik/Extender: Allen DerryStone, [facility] Weeks in Treatment: 0 Allergies Active Allergies codeine Reaction: nausea erythromycin base Reaction: nausea fentanyl morphine Orencia propoxyphene Sulfa (Sulfonamide Antibiotics) penicillin Reaction: rash Allergy Notes Electronic Signature(s) Signed: 07/07/2022 9:21:33 AM By: Angelina PihGordon, Caitlin Entered By: Angelina PihGordon, Caitlin on 07/07/2022 09:21:33 -------------------------------------------------------------------------------- Arrival Information Details Patient Name: Date of Service: Carol Kent, Carol Kent. 07/07/2022 8:15 A M Medical Record Number: [id number] Patient Account Number: [id number] Date of Birth/Sex: Treating RN: 14-Feb-1941 (82 y.o. Female) Angelina PihGordon, Caitlin Primary Care Adelard Sanon: Jerl MinaHedrick, James Other Clinician: Referring Virjean Boman: Treating Mariana Goytia/Extender: Allen DerryStone, [facility]oy[facility] Weeks in Treatment: 0 Visit Information Patient Arrived: Wheel Chair Arrival Time: 08:29 Accompanied By: daughter Transfer Assistance: None Patient Identification Verified: Yes Secondary Verification Process Completed: Yes Patient Has Alerts: Yes Patient Alerts: ABI non compressible Electronic Signature(s) Signed: 07/07/2022 9:27:26 AM By: Angelina PihGordon, Caitlin Entered By: Angelina PihGordon, Caitlin on 07/07/2022 09:27:26 Clinic Level of Care Assessment Details -------------------------------------------------------------------------------- Carol Kent, Tish Kent ([id number]) [id number] Page 2 of 9 Patient Name: Date of  Service: Carol Kent, Carol Kent. 07/07/2022 8:15 A M Medical Record Number: [id number] Patient Account Number: [id number] Date of Birth/Sex: Treating RN: 14-Feb-1941 (82 y.o. Female) Angelina PihGordon, Caitlin Primary Care Irlanda Croghan: Jerl MinaHedrick, James Other Clinician: Referring Shaquilla Kehres: Treating Tonji Elliff/Extender: Allen DerryStone, Hoyt [facility] Weeks in Treatment: 0 Clinic Level of Care Assessment Items TOOL 1 Quantity Score []  - 0 Use when EandM and Procedure is performed on INITIAL visit ASSESSMENTS - Nursing Assessment / Reassessment X- 1 20 General Physical Exam (combine w/ comprehensive assessment (listed just below) when performed on new pt. evals) X- 1 25 Comprehensive Assessment (HX, ROS, Risk Assessments, Wounds Hx, etc.) ASSESSMENTS - Wound and Skin Assessment / Reassessment []  - 0 Dermatologic / Skin Assessment (not related to wound area) ASSESSMENTS - Ostomy and/or Continence Assessment and Care []  - 0 Incontinence Assessment and Management []  - 0 Ostomy Care Assessment and Management (repouching, etc.) PROCESS - Coordination of Care X - Simple Patient / Family Education for ongoing care 1 15 []  - 0 Complex (extensive) Patient / Family Education for ongoing care X- 1 10 Staff obtains ChiropractorConsents, Records, T Results / Process Orders est []  - 0 Staff telephones HHA, Nursing Homes / Clarify orders / etc []  - 0 Routine Transfer to another Facility (non-emergent condition) []  - 0 Routine Hospital Admission (non-emergent condition) X- 1 15 New Admissions / Manufacturing engineernsurance Authorizations / Ordering NPWT Apligraf, etc. , []  - 0 Emergency Hospital Admission (emergent condition) PROCESS - Special Needs []  - 0 Pediatric / Minor Patient Management []  - 0 Isolation Patient Management []  - 0 Hearing / Language / Visual special needs []  - 0 Assessment of Community assistance (transportation, D/Kent planning, etc.) []  - 0 Additional assistance / Altered mentation []  - 0 Support Surface(s) Assessment  (bed, cushion, seat, etc.) INTERVENTIONS - Miscellaneous []  - 0 External ear exam []  - 0 Patient Transfer (multiple staff / Nurse, adultHoyer Lift / Similar devices) []  - 0 Simple Staple / Suture removal (25 or less) []  - 0 Complex Staple / Suture removal (26 or more) []  - 0 Hypo/Hyperglycemic Management (do not check if billed separately) X- 1 15 Ankle / Brachial Index (ABI) - do  not check if billed separately Has the patient been seen at the hospital within the last three years: Yes Total Score: 100 Level Of Care: New/Established - Level 3 Electronic Signature(s) Signed: 07/07/2022 4:43:19 PM By: Angelina Pih Entered By: Angelina Pih on 07/07/2022 15:10:40 Encounter Discharge Information Details -------------------------------------------------------------------------------- Carol Larry ([id number]) [id number] Page 3 of 9 Patient Name: Date of Service: [name], [name] 07/07/2022 8:15 A M Medical Record Number: [id number] Patient Account Number: [id number] Date of Birth/Sex: Treating RN: [birth year] (82 y.o. Female) Angelina Pih Primary Care Lashanna Angelo: [name] Other Clinician: Referring Alekai Pocock: Treating Ganesh Deeg/Extender: Allen Derry [facility], KELLY Weeks in Treatment: 0 Encounter Discharge Information Items Post Procedure Vitals Discharge Condition: Stable Temperature (F): 98.2 Ambulatory Status: Wheelchair Pulse (bpm): 92 Discharge Destination: Home Respiratory Rate (breaths/min): 18 Transportation: Private Auto Blood Pressure (mmHg): 135/52 Accompanied By: daughter Schedule Follow-up Appointment: No Clinical Summary of Care: Electronic Signature(s) Signed: 07/07/2022 3:27:01 PM By: Angelina Pih Entered By: Angelina Pih on 07/07/2022 15:27:01 -------------------------------------------------------------------------------- Lower Extremity Assessment Details Patient Name: Date of Service: TASHEKA, HOUSEMAN 07/07/2022 8:15 A  M Medical Record Number: [id number] Patient Account Number: [id number] Date of Birth/Sex: Treating RN: [birth year] (82 y.o. Female) Angelina Pih Primary Care Jamarri Vuncannon: [name] Other Clinician: Referring Bucky Grigg: Treating Archie Shea/Extender: Allen Derry [facility], KELLY Weeks in Treatment: 0 Edema Assessment Assessed: [Left: No] [Right: No] Edema: [Left: Yes] [Right: Yes] Calf Left: Right: Point of Measurement: 33 cm From Medial Instep 36.2 cm Ankle Left: Right: Point of Measurement: 10 cm From Medial Instep 25.5 cm Vascular Assessment Pulses: Dorsalis Pedis Doppler Audible: [Left:Yes] [Right:Yes] Posterior Tibial Doppler Audible: [Left:Yes] [Right:Yes] Notes Using MESI, ABI states weak pulses, PAD for left and right Electronic Signature(s) Signed: 07/07/2022 9:26:37 AM By: Angelina Pih Entered By: Angelina Pih on 07/07/2022 09:26:36 -------------------------------------------------------------------------------- Multi Wound Chart Details Patient Name: Date of Service: Carol Kent 07/07/2022 8:15 A M Medical Record Number: [id number] Patient Account Number: [id number] Date of Birth/Sex: Treating RN: 13-May-1940 (82 y.o. Female) Angelina Pih Primary Care Jaidyn Kuhl: [name] Other Clinician: Referring Jori Frerichs: Treating Luvada Salamone/Extender: Allen Derry [facility], KELLY Weeks in Treatment: 0 DAZIAH, HESLER ([id number]) [id number] Page 4 of 9 Vital Signs Height(in): 62 Pulse(bpm): 92 Weight(lbs): 150 Blood Pressure(mmHg): 135/52 Body Mass Index(BMI): 27.4 Temperature(F): 98.2 Respiratory Rate(breaths/min): 18 [1:Photos:] [Kent/A:Kent/A] Right Lower Leg Right, Proximal, Lateral Lower Leg Kent/A Wound Location: Skin Tear/Laceration Skin Tear/Laceration Kent/A Wounding Event: Skin Tear Skin Tear Kent/A Primary Etiology: Cataracts, Anemia, Congestive Heart Cataracts, Anemia, Congestive Heart Kent/A Comorbid History: Failure, Rheumatoid  Arthritis Failure, Rheumatoid Arthritis 06/19/2022 06/19/2022 Kent/A Date Acquired: 0 0 Kent/A Weeks of Treatment: Open Open Kent/A Wound Status: No No Kent/A Wound Recurrence: 12x9x0.1 2x0.5x0.1 Kent/A Measurements L x W x D (cm) 84.823 0.785 Kent/A A (cm) : rea 8.482 0.079 Kent/A Volume (cm) : Full Thickness Without Exposed Full Thickness Without Exposed Kent/A Classification: Support Structures Support Structures Medium Medium Kent/A Exudate A mount: Serosanguineous Serosanguineous Kent/A Exudate Type: red, brown red, brown Kent/A Exudate Color: Small (1-33%) Medium (34-66%) Kent/A Granulation A mount: Red Red, Pink Kent/A Granulation Quality: Large (67-100%) Medium (34-66%) Kent/A Necrotic A mount: Fat Layer (Subcutaneous Tissue): Yes Fat Layer (Subcutaneous Tissue): Yes Kent/A Exposed Structures: None None Kent/A Epithelialization: Treatment Notes Electronic Signature(s) Signed: 07/07/2022 4:43:19 PM By: Angelina Pih Entered By: Angelina Pih on 07/07/2022 09:38:02 -------------------------------------------------------------------------------- Multi-Disciplinary Care Plan Details Patient Name: Date of Service: Carol Kent. 07/07/2022 8:15 A M Medical Record Number: [id number] Patient Account Number: [id number] Date of Birth/Sex:  Treating RN: Nov 17, 1940 (82 y.o. Female) Angelina Pih Primary Care Ludy Messamore: [name] Other Clinician: Referring Leodan Bolyard: Treating Jennavieve Arrick/Extender: Allen Derry [facility], KELLY Weeks in Treatment: 0 Active Inactive Abuse / Safety / Falls / Self Care Management Nursing Diagnoses: History of Falls Impaired physical mobility Potential for falls Potential for injury related to falls Goals: Patient will not develop complications from immobility Date Initiated: 07/07/2022 Target Resolution Date: 08/04/2022 Goal Status: Active ELLOWYN, RIEVES ([id number]) [phone].pdf Page 5 of 9 Patient will not experience any injury related to falls Date  Initiated: 07/07/2022 Target Resolution Date: 08/04/2022 Goal Status: Active Patient will remain injury free related to falls Date Initiated: 07/07/2022 Target Resolution Date: 08/18/2022 Goal Status: Active Patient/caregiver will demonstrate safe use of adaptive devices to increase mobility Date Initiated: 07/07/2022 Target Resolution Date: 08/04/2022 Goal Status: Active Patient/caregiver will verbalize understanding of skin care regimen Date Initiated: 07/07/2022 Date Inactivated: 07/07/2022 Target Resolution Date: 07/07/2022 Goal Status: Met Patient/caregiver will verbalize/demonstrate measures taken to improve the patient's personal safety Date Initiated: 07/07/2022 Target Resolution Date: 08/11/2022 Goal Status: Active Patient/caregiver will verbalize/demonstrate measures taken to prevent injury and/or falls Date Initiated: 07/07/2022 Target Resolution Date: 08/11/2022 Goal Status: Active Interventions: Assess Activities of Daily Living upon admission and as needed Assess fall risk on admission and as needed Assess impairment of mobility on admission and as needed per policy Assess personal safety and home safety (as indicated) on admission and as needed Provide education on fall prevention Provide education on personal and home safety Provide education on safe transfers Notes: Necrotic Tissue Nursing Diagnoses: Impaired tissue integrity related to necrotic/devitalized tissue Knowledge deficit related to management of necrotic/devitalized tissue Goals: Necrotic/devitalized tissue will be minimized in the wound bed Date Initiated: 07/07/2022 Target Resolution Date: 08/04/2022 Goal Status: Active Patient/caregiver will verbalize understanding of reason and process for debridement of necrotic tissue Date Initiated: 07/07/2022 Date Inactivated: 07/07/2022 Target Resolution Date: 07/07/2022 Goal Status: Met Interventions: Assess patient pain level pre-, during and post procedure and prior to  discharge Provide education on necrotic tissue and debridement process Treatment Activities: Biologic debridement : 07/07/2022 Notes: Wound/Skin Impairment Nursing Diagnoses: Impaired tissue integrity Knowledge deficit related to ulceration/compromised skin integrity Goals: Ulcer/skin breakdown will have a volume reduction of 30% by week 4 Date Initiated: 07/07/2022 Target Resolution Date: 08/04/2022 Goal Status: Active Ulcer/skin breakdown will have a volume reduction of 50% by week 8 Date Initiated: 07/07/2022 Target Resolution Date: 09/01/2022 Goal Status: Active Ulcer/skin breakdown will have a volume reduction of 80% by week 12 Date Initiated: 07/07/2022 Target Resolution Date: 09/29/2022 Goal Status: Active Ulcer/skin breakdown will heal within 14 weeks Date Initiated: 07/07/2022 Target Resolution Date: 10/13/2022 Goal Status: Active Interventions: Assess patient/caregiver ability to obtain necessary supplies Assess patient/caregiver ability to perform ulcer/skin care regimen upon admission and as needed JOLANDA, MCCANN ([id number]) [id number] Page 6 of 9 Assess ulceration(s) every visit Provide education on ulcer and skin care Treatment Activities: Skin care regimen initiated : 07/07/2022 Notes: Electronic Signature(s) Signed: 07/07/2022 3:29:39 PM By: Angelina Pih Previous Signature: 07/07/2022 3:25:26 PM Version By: Angelina Pih Entered By: Angelina Pih on 07/07/2022 15:29:39 -------------------------------------------------------------------------------- Pain Assessment Details Patient Name: Date of Service: Carol Kent 07/07/2022 8:15 A M Medical Record Number: [id number] Patient Account Number: [id number] Date of Birth/Sex: Treating RN: 04/11/1940 (82 y.o. Female) Angelina Pih Primary Care Huntleigh Doolen: [name] Other Clinician: Referring Jannice Beitzel: Treating Teasia Zapf/Extender: Allen Derry [facility], KELLY Weeks in Treatment: 0 Active  Problems Location of Pain Severity and Description of Pain Patient Has  Paino Yes Site Locations Rate the pain. Current Pain Level: 5 Pain Management and Medication Current Pain Management: Notes pt states some pain at wound site Electronic Signature(s) Signed: 07/07/2022 4:43:19 PM By: Angelina Pih Entered By: Angelina Pih on 07/07/2022 08:30:32 -------------------------------------------------------------------------------- Patient/Caregiver Education Details Patient Name: Date of Service: Carol Kent 4/8/2024andnbsp8:15 A M Medical Record Number: [id number] Patient Account Number: [id number] Date of Birth/Gender: Treating RN: 1940/10/29 (82 y.o. Female) Angelina Pih Primary Care Physician: [name] Other Clinician: Referring Physician: Treating Physician/Extender: Allen Derry [facility], Tresa Endo Weeks in Treatment: 0 Education Assessment JINCY, KONICEK ([id number]) [id number] Page 7 of 9 Education Provided To: Patient Education Topics Provided Welcome T [facility] Packet: o Handouts: [facility] Pledge form, Welcome T [facility] o Methods: Explain/Verbal Responses: State content correctly Wound Debridement: Handouts: Wound Debridement Methods: Explain/Verbal Responses: State content correctly Wound/Skin Impairment: Handouts: Caring for Your Ulcer Methods: Explain/Verbal Electronic Signature(s) Signed: 07/07/2022 4:43:19 PM By: Angelina Pih Entered By: Angelina Pih on 07/07/2022 15:25:56 -------------------------------------------------------------------------------- Wound Assessment Details Patient Name: Date of Service: Carol Kent 07/07/2022 8:15 A M Medical Record Number: [id number] Patient Account Number: [id number] Date of Birth/Sex: Treating RN: 09-28-1940 (82 y.o. Female) Angelina Pih Primary Care Jeevan Kalla: [name] Other Clinician: Referring  Manning Luna: Treating Norvin Ohlin/Extender: Allen Derry [facility], KELLY Weeks in Treatment: 0 Wound Status Wound Number: 1 Primary Skin Tear Etiology: Wound Location: Right Lower Leg Wound Status: Open Wounding Event: Skin Tear/Laceration Comorbid Cataracts, Anemia, Congestive Heart Failure, Rheumatoid Date Acquired: 06/19/2022 History: Arthritis Weeks Of Treatment: 0 Clustered Wound: No Photos Wound Measurements Length: (cm) 12 Width: (cm) 9 Depth: (cm) 0.1 Area: (cm) 84.823 Volume: (cm) 8.482 % Reduction in Area: % Reduction in Volume: Epithelialization: None Tunneling: No Undermining: No Wound Description Classification: Full Thickness Without Exposed Suppor Exudate Amount: Medium Exudate Type: Serosanguineous Exudate Color: red, brown t Structures Foul Odor After Cleansing: No Slough/Fibrino Yes Wound Bed Granulation Amount: Small (1-33%) Exposed Structure Granulation Quality: Red Fat Layer (Subcutaneous Tissue) Exposed: Yes SEQUOYAH, PIESTER ([id number]) [id number] Page 8 of 9 Necrotic Amount: Large (67-100%) Necrotic Quality: Adherent Slough Treatment Notes Wound #1 (Lower Leg) Wound Laterality: Right Cleanser Soap and Water Discharge Instruction: Gently cleanse wound with antibacterial soap, rinse and pat dry prior to dressing wounds Peri-Wound Care Topical Primary Dressing Xeroform 5x9-HBD (in/in) Discharge Instruction: Apply Xeroform 5x9-HBD (in/in) as directed Double layer Secondary Dressing ABD Pad 5x9 (in/in) Discharge Instruction: Cover with ABD pad Secured With Medipore T - 21M Medipore H Soft Cloth Surgical T ape ape, 2x2 (in/yd) Conform 4'' - Conforming Stretch Gauze Bandage 4x75 (in/in) Discharge Instruction: Apply as directed Tubigrip Size F, 4x10 (in/yd) Discharge Instruction: Apply Tubigrip F 3 finger-widths below knee to base of toes to secure dressing and/or for swelling. Single layer Compression Wrap Compression  Stockings Add-Ons Electronic Signature(s) Signed: 07/07/2022 4:43:19 PM By: Angelina Pih Previous Signature: 07/07/2022 9:15:11 AM Version By: Angelina Pih Entered By: Angelina Pih on 07/07/2022 09:34:57 -------------------------------------------------------------------------------- Wound Assessment Details Patient Name: Date of Service: Carol Kent 07/07/2022 8:15 A M Medical Record Number: [id number] Patient Account Number: [id number] Date of Birth/Sex: Treating RN: February 16, 1941 (82 y.o. Female) Angelina Pih Primary Care Naomii Kreger: [name] Other Clinician: Referring Adamarys Shall: Treating Alpha Mysliwiec/Extender: Allen Derry [facility], KELLY Weeks in Treatment: 0 Wound Status Wound Number: 2 Primary Skin Tear Etiology: Wound Location: Right, Proximal, Lateral Lower Leg Wound Status: Open Wounding Event: Skin Tear/Laceration Comorbid Cataracts, Anemia, Congestive Heart Failure, Rheumatoid Date Acquired:  06/19/2022 History: Arthritis Weeks Of Treatment: 0 Clustered Wound: No Photos JERSIE, TOSTO ([id number]) [id number] Page 9 of 9 Wound Measurements Length: (cm) 2 Width: (cm) 0.5 Depth: (cm) 0.1 Area: (cm) 0.785 Volume: (cm) 0.079 % Reduction in Area: % Reduction in Volume: Epithelialization: None Tunneling: No Undermining: No Wound Description Classification: Full Thickness Without Exposed Support Structures Exudate Amount: Medium Exudate Type: Serosanguineous Exudate Color: red, brown Foul Odor After Cleansing: No Slough/Fibrino Yes Wound Bed Granulation Amount: Medium (34-66%) Exposed Structure Granulation Quality: Red, Pink Fat Layer (Subcutaneous Tissue) Exposed: Yes Necrotic Amount: Medium (34-66%) Necrotic Quality: Adherent Scientist, physiological) Signed: 07/07/2022 9:15:38 AM By: Angelina Pih Entered By: Angelina Pih on 07/07/2022  09:15:38 -------------------------------------------------------------------------------- Vitals Details Patient Name: Date of Service: Carol Kent. 07/07/2022 8:15 A M Medical Record Number: [id number] Patient Account Number: [id number] Date of Birth/Sex: Treating RN: 1940/04/27 (82 y.o. Female) Angelina Pih Primary Care Jaxtyn Linville: [name] Other Clinician: Referring Rayson Rando: Treating Essa Malachi/Extender: Allen Derry [facility], KELLY Weeks in Treatment: 0 Vital Signs Time Taken: 08:30 Temperature (F): 98.2 Height (in): 62 Pulse (bpm): 92 Source: Stated Respiratory Rate (breaths/min): 18 Weight (lbs): 150 Blood Pressure (mmHg): 135/52 Source: Stated Reference Range: 80 - 120 mg / dl Body Mass Index (BMI): 27.4 Electronic Signature(s) Signed: 07/07/2022 4:43:19 PM By: Angelina Pih Entered By: Angelina Pih on 07/07/2022 08:30:59

## 2022-07-07 NOTE — Progress Notes (Signed)
Carol Kent, Carol Kent ([id number]) [id number] [id number].pdf Page 1 of 4 Visit Report for 07/07/2022 Abuse Risk Screen Details Patient Name: Date of Service: Carol Kent, Carol Kent 07/07/2022 8:15 A M Medical Record Number: [id number] Patient Account Number: [id number] Date of Birth/Sex: Treating RN: [birth year] (82 y.o. Female) Angelina Pih Primary Care Carol Kent: [name] Other Clinician: Referring Carol Kent: Treating Krisy Dix/Extender: Carol Kent [facility], Kent Weeks in Treatment: 0 Abuse Risk Screen Items Answer ABUSE RISK SCREEN: Has anyone close to you tried to hurt or harm you recentlyo No Do you feel uncomfortable with anyone in your familyo No Has anyone forced you do things that you didnt want to doo No Electronic Signature(s) Signed: 07/07/2022 4:43:19 PM By: Angelina Pih Entered By: Angelina Pih on 07/07/2022 08:41:28 -------------------------------------------------------------------------------- Activities of Daily Living Details Patient Name: Date of Service: Carol Kent, Carol Kent 07/07/2022 8:15 A M Medical Record Number: [id number] Patient Account Number: [id number] Date of Birth/Sex: Treating RN: Carol Kent (82 y.o. Female) Angelina Pih Primary Care Carol Kent: [name] Other Clinician: Referring Carol Kent: Treating Tyrina Hines/Extender: Carol Kent Carol Kent Weeks in Treatment: 0 Activities of Daily Living Items Answer Activities of Daily Living (Please select one for each item) Drive Automobile Need Assistance T Medications ake Need Assistance Use T elephone Need Assistance Care for Appearance Need Assistance Use T oilet Need Assistance Bath / Shower Need Assistance Dress Self Need Assistance Feed Self Need Assistance Walk Need Assistance Get In / Out Bed Need Assistance Housework Need Assistance Prepare Meals Need Assistance Handle Money Need Assistance Shop for Self Need Assistance Electronic Signature(s) Signed: 07/07/2022  4:43:19 PM By: Angelina Pih Entered By: Angelina Pih on 07/07/2022 08:41:52 -------------------------------------------------------------------------------- Education Screening Details Patient Name: Date of Service: Carol Kent 07/07/2022 8:15 A M Medical Record Number: [id number] Patient Account Number: [id number] Date of Birth/Sex: Treating RN: Carol Kent (82 y.o. Female) Angelina Pih Primary Care Devontae Casasola: [name] Other Clinician: Referring Aleen Marston: Treating Prestyn Mahn/Extender: Carol Kent [facility], Kent Weeks in Treatment: 0 Carol Kent, Carol Kent ([id number]) [id number] [id number].pdf Page 2 of 4 Learning Preferences/Education Level/Primary Language Learning Preference: Explanation, Demonstration, Video, Public affairs consultant, Printed Material Preferred Language: Economist Language Barrier: No Translator Needed: No Memory Deficit: No Emotional Barrier: No Cultural/Religious Beliefs Affecting Medical Care: No Physical Barrier Impaired Vision: No Impaired Hearing: No Decreased Hand dexterity: No Knowledge/Comprehension Knowledge Level: Medium Comprehension Level: Medium Ability to understand written instructions: Medium Ability to understand verbal instructions: Medium Motivation Anxiety Level: Calm Cooperation: Cooperative Education Importance: Acknowledges Need Interest in Health Problems: Asks Questions Perception: Coherent Willingness to Engage in Self-Management High Activities: Readiness to Engage in Self-Management High Activities: Electronic Signature(s) Signed: 07/07/2022 4:43:19 PM By: Angelina Pih Entered By: Angelina Pih on 07/07/2022 08:42:11 -------------------------------------------------------------------------------- Fall Risk Assessment Details Patient Name: Date of Service: Carol Kent 07/07/2022 8:15 A M Medical Record Number: [id number] Patient Account Number: [id number] Date of  Birth/Sex: Treating RN: Carol Kent (82 y.o. Female) Angelina Pih Primary Care Sigmund Morera: [name] Other Clinician: Referring Sarrah Fiorenza: Treating Gemma Ruan/Extender: Carol Kent [facility], Kent Weeks in Treatment: 0 Fall Risk Assessment Items Have you had 2 or more falls in the last 12 monthso 0 Yes Have you had any fall that resulted in injury in the last 12 monthso 0 Yes FALLS RISK SCREEN History of falling - immediate or within 3 months 25 Yes Secondary diagnosis (Do you have 2 or more medical diagnoseso) 0 No Ambulatory aid None/bed rest/wheelchair/nurse 0 No Crutches/cane/walker 15 Yes Furniture 0 No Intravenous therapy Access/Saline/Heparin Lock 0 No Gait/Transferring Normal/ bed  rest/ wheelchair 0 No Weak (short steps with or without shuffle, stooped but able to lift head while walking, may seek 10 Yes support from furniture) Impaired (short steps with shuffle, may have difficulty arising from chair, head down, impaired 0 No balance) Mental Status Oriented to own ability 0 No Electronic Signature(s) Carol Kent, Carol Kent ([id number]) [id number] [id number].pdf Page 3 of 4 Signed: 07/07/2022 4:43:19 PM By: Angelina PihGordon, Kent Entered By: Angelina PihGordon, Kent on 07/07/2022 08:42:41 -------------------------------------------------------------------------------- Foot Assessment Details Patient Name: Date of Service: Carol Kent, Carol Kent. 07/07/2022 8:15 A M Medical Record Number: [id number] Patient Account Number: [id number] Date of Birth/Sex: Treating RN: [birth year] Kent (82 y.o. Female) Angelina PihGordon, Kent Primary Care Carol Kent: Jerl MinaHedrick, Kent Other Clinician: Referring Carol Kent: Treating Carol Kent/Extender: Carol DerryStone, [facility] [place] Weeks in Treatment: 0 Foot Assessment Items Site Locations + = Sensation present, - = Sensation absent, Kent = Callus, U = Ulcer R = Redness, W = Warmth, M = Maceration, PU = Pre-ulcerative lesion F = Fissure, S = Swelling, D =  Dryness Assessment Right: Left: Other Deformity: No No Prior Foot Ulcer: No No Prior Amputation: No No Charcot Joint: No No Ambulatory Status: Ambulatory With Help Assistance Device: Wheelchair Gait: Academic librarianUnsteady Electronic Signature(s) Signed: 07/07/2022 4:43:19 PM By: Angelina PihGordon, Kent Entered By: Angelina PihGordon, Kent on 07/07/2022 08:55:41 -------------------------------------------------------------------------------- Nutrition Risk Screening Details Patient Name: Date of Service: Carol Kent, Carol Kent. 07/07/2022 8:15 A M Medical Record Number: [id number] Patient Account Number: [id number] Date of Birth/Sex: Treating RN: [birth year] Kent (82 y.o. Female) Angelina PihGordon, Kent Primary Care Luka Reisch: Jerl MinaHedrick, Kent Other Clinician: Referring Jaevian Shean: Treating Seann Genther/Extender: Carol DerryStone, [facility] Kent Weeks in Treatment: 0 Height (in): 62 Weight (lbs): 150 Body Mass Index (BMI): 27.4 Carol Kent, Carol Kent ([id number]) [id number] Nursing_[id number].pdf Page 4 of 4 Nutrition Risk Screening Items Score Screening NUTRITION RISK SCREEN: I have an illness or condition that made me change the kind and/or amount of food I eat 0 No I eat fewer than two meals per day 0 No I eat few fruits and vegetables, or milk products 2 Yes I have three or more drinks of beer, liquor or wine almost every day 0 No I have tooth or mouth problems that make it hard for me to eat 0 No I don't always have enough money to buy the food I need 0 No I eat alone most of the time 0 No I take three or more different prescribed or over-the-counter drugs a day 0 No Without wanting to, I have lost or gained 10 pounds in the last six months 0 No I am not always physically able to shop, cook and/or feed myself 0 No Nutrition Protocols Good Risk Protocol Moderate Risk Protocol 0 Provide education on nutrition High Risk Proctocol Risk Level: Good Risk Score: 2 Electronic Signature(s) Signed: 07/07/2022 4:43:19 PM By: Angelina PihGordon,  Kent Entered By: Angelina PihGordon, Kent on 07/07/2022 08:42:59

## 2022-07-07 NOTE — Progress Notes (Signed)
..  History of Present Illness  There is no documented history at this time  Assessments & Plan   There are no diagnoses linked to this encounter.    Additional instructions  Subjective:  Patient presents with venous ulcer of the right lower extremity.    Procedure:  3 layer unna wrap was placed right lower extremity.   Plan:   Follow up in one week.  

## 2022-07-08 NOTE — Progress Notes (Unsigned)
  Cardiology Office Note:   Date:  07/08/2022  ID:  Carol Kent, DOB 1940/06/12, MRN [id number]  History of Present Illness:   Carol Kent is a 82 y.o. female with a past medical history of rheumatoid arthritis, hypertension, chronic kidney disease, former smoker, PAD with occluded right carotid,  ROS: ***  Studies Reviewed:    EKG:  sinus rate of 86, nonspecific ST and T wave abnormality  ***  Risk Assessment/Calculations:   {Does this patient have ATRIAL FIBRILLATION?:[id number]} No BP recorded.  {Refresh Note OR Click here to enter BP  :1}***        Physical Exam:   VS:  There were no vitals taken for this visit.   Wt Readings from Last 3 Encounters:  06/24/22 143 lb 4.8 oz (65 kg)  06/22/22 145 lb 1 oz (65.8 kg)  06/06/22 145 lb (65.8 kg)     GEN: Well nourished, well developed in no acute distress NECK: No JVD; No carotid bruits CARDIAC: ***RRR, no murmurs, rubs, gallops RESPIRATORY:  Clear to auscultation without rales, wheezing or rhonchi  ABDOMEN: Soft, non-tender, non-distended EXTREMITIES:  No edema; No deformity   ASSESSMENT AND PLAN:   ***    {Are you ordering a CV Procedure (e.g. stress test, cath, DCCV, TEE, etc)?   Press F2        :[id number]}   Signed, Hasina Kreager, NP

## 2022-07-09 ENCOUNTER — Ambulatory Visit: Payer: Medicare Other | Attending: Cardiology | Admitting: Cardiology

## 2022-07-09 ENCOUNTER — Encounter: Payer: Self-pay | Admitting: Cardiology

## 2022-07-09 ENCOUNTER — Encounter (INDEPENDENT_AMBULATORY_CARE_PROVIDER_SITE_OTHER): Payer: Self-pay

## 2022-07-09 VITALS — BP 145/72 | HR 86 | Ht 62.0 in | Wt 150.6 lb

## 2022-07-09 DIAGNOSIS — I5032 Chronic diastolic (congestive) heart failure: Secondary | ICD-10-CM | POA: Diagnosis present

## 2022-07-09 DIAGNOSIS — R600 Localized edema: Secondary | ICD-10-CM

## 2022-07-09 DIAGNOSIS — N184 Chronic kidney disease, stage 4 (severe): Secondary | ICD-10-CM

## 2022-07-09 DIAGNOSIS — I6521 Occlusion and stenosis of right carotid artery: Secondary | ICD-10-CM

## 2022-07-09 DIAGNOSIS — R0602 Shortness of breath: Secondary | ICD-10-CM

## 2022-07-09 DIAGNOSIS — I10 Essential (primary) hypertension: Secondary | ICD-10-CM

## 2022-07-09 DIAGNOSIS — I739 Peripheral vascular disease, unspecified: Secondary | ICD-10-CM

## 2022-07-09 MED ORDER — FUROSEMIDE 40 MG PO TABS: ORAL_TABLET | ORAL | 0 refills | Status: DC

## 2022-07-09 NOTE — Progress Notes (Signed)
Carol, Kent ([id number]) [id number] Page 1 of 9 Visit Report for 07/07/2022 Chief Complaint Document Details Patient Name: Date of Service: Carol Kent, Carol Kent 07/07/2022 8:15 A M Medical Record Number: [id number] Patient Account Number: [id number] Date of Birth/Sex: Treating RN: 09-Feb-1941 (82 y.o. Female) Angelina Pih Primary Care Provider: [name] Other Clinician: Referring Provider: Treating Provider/Extender: Allen Derry [facility], KELLY Weeks in Treatment: 0 Information Obtained from: Patient Chief Complaint Right LE Ulcers/Skin Tears Electronic Signature(s) Signed: 07/07/2022 9:30:45 AM By: Lenda Kelp PA-C Entered By: Lenda Kelp on 07/07/2022 09:30:45 -------------------------------------------------------------------------------- Debridement Details Patient Name: Date of Service: Carol Kent 07/07/2022 8:15 A M Medical Record Number: [id number] Patient Account Number: [id number] Date of Birth/Sex: Treating RN: 19-Feb-1941 (82 y.o. Female) Angelina Pih Primary Care Provider: [name] Other Clinician: Referring Provider: Treating Provider/Extender: Allen Derry [facility], KELLY Weeks in Treatment: 0 Debridement Performed for Assessment: Wound #1 Right Lower Leg Performed By: Physician Nelida Meuse., PA-C Debridement Type: Debridement Level of Consciousness (Pre-procedure): Awake and Alert Pre-procedure Verification/Time Out Yes - 09:38 Taken: Pain Control: Lidocaine 4% T opical Solution T Area Debrided (L x W): otal 12 (cm) x 9 (cm) = 108 (cm) Tissue and other material debrided: Viable, Non-Viable, Slough, Subcutaneous, Slough Level: Skin/Subcutaneous Tissue Debridement Description: Excisional Instrument: Curette Bleeding: Moderate Hemostasis Achieved: Pressure Response to Treatment: Procedure was tolerated well Level of Consciousness (Post- Awake and Alert procedure): Post Debridement Measurements of Total  Wound Length: (cm) 12 Width: (cm) 9 Depth: (cm) 0.1 Volume: (cm) 8.482 Character of Wound/Ulcer Post Debridement: Stable Post Procedure Diagnosis Same as Pre-procedure Electronic Signature(s) Signed: 07/07/2022 4:43:19 PM By: Angelina Pih Signed: 07/08/2022 6:21:14 PM By: Allen Derry PA-C Entered By: Angelina Pih on 07/07/2022 09:48:20 HPI Details -------------------------------------------------------------------------------- [name] ([id number]) [id number] Page 2 of 9 Patient Name: Date of Service: [name], Kent 07/07/2022 8:15 A M Medical Record Number: [id number] Patient Account Number: [id number] Date of Birth/Sex: Treating RN: October 09, 1940 (82 y.o. Female) Angelina Pih Primary Care Provider: [name] Other Clinician: Referring Provider: Treating Provider/Extender: Allen Derry [facility], KELLY Weeks in Treatment: 0 History of Present Illness HPI Description: 07-07-2022 upon evaluation today patient appears to be doing somewhat poorly in regard to wounds that actually began in the right leg as a result of trauma. She tells me that she has been having some discomfort it is not hurting too bad however. She was originally placed on Keflex but she is finished this. She did not go to the ER to have this checked out for 3 days after it initially happened so they were not able to Steri-Strip anything back down. She has been using bacitracin and Xeroform. She does have family members that have been helping her with dressing changes. With that being said she does not appear to have any signs of infection right now but nonetheless does have obviously the wounds that need to be addressed. Patient does have a history of chronic kidney disease stage IV, congestive heart failure, lower extremity venous insufficiency and swelling although this is not too significant, and peripheral vascular disease as ABIs are noncompressible. She does seem to have  good blood flow however. Her capillary refill was good and the wounds themselves do seem to be bleeding even with light cleaning. Electronic Signature(s) Signed: 07/08/2022 8:48:28 AM By: Lenda Kelp PA-C Entered By: Lenda Kelp on 07/08/2022 08:48:28 -------------------------------------------------------------------------------- Physical Exam Details Patient Name: Date of Service: Carol Kent 07/07/2022 8:15 A M Medical Record  Number: [id number] Patient Account Number: [id number] Date of Birth/Sex: Treating RN: Jul 17, 1940 [age] y.o. Female) Angelina PihGordon, Caitlin Primary Care Provider: Jerl MinaHedrick, James Other Clinician: Referring Provider: Treating Provider/Extender: Allen DerryStone, Esmay Amspacher [facility] KE[facility] Weeks in Treatment: 0 Constitutional sitting or standing blood pressure is within target range for patient.. pulse regular and within target range for patient.[name]. respirations regular, non-labored and within target range for patient.[name]. temperature within target range for patient.. Well-nourished and well-hydrated in no acute distress. Eyes conjunctiva clear no eyelid edema noted. pupils equal round and reactive to light and accommodation. Ears, Nose, Mouth, and Throat no gross abnormality of ear auricles or external auditory canals. normal hearing noted during conversation. mucus membranes moist. Respiratory normal breathing without difficulty. Cardiovascular 1+ dorsalis pedis/posterior tibialis pulses. 2+ pitting edema of the bilateral lower extremities. Musculoskeletal Patient unable to walk without assistance. no significant deformity or arthritic changes, no loss or range of motion, no clubbing. Psychiatric this patient is able to make decisions and demonstrates good insight into disease process. Alert and Oriented x 3. pleasant and cooperative. Notes Upon inspection patient's wounds actually did require some management today I did perform debridement in regard to the larger of the 2 wounds  which again I think actually is doing quite well. In regard to the other this is an area that I debrided today we will get a continue to monitor that but I cannot promise that we will need to be done. Fortunately I do not see any evidence of infection at this time she does have quite a bit however of significant lower extremity edema with really fragile appearing skin. Electronic Signature(s) Signed: 07/08/2022 8:49:28 AM By: Lenda KelpStone III, Pleshette Tomasini PA-C Entered By: Lenda KelpStone III, Mayah Urquidi on 07/08/2022 08:49:28 -------------------------------------------------------------------------------- Physician Orders Details Patient Name: Date of Service: Carol FlorenceWILKERSO N, Alissa C. 07/07/2022 8:15 A M Medical Record Number: [id number] Patient Account Number: [id number] [name], Kourtni C ([id number]) [id number] Page 3 of 9 Date of Birth/Sex: Treating RN: Jul 17, 1940 [age] y.o. Female) Angelina PihGordon, Caitlin Primary Care Provider: Other Clinician: Jerl MinaHedrick, James Referring Provider: Treating Provider/Extender: Allen DerryStone, Braydee [facility] KE[facility] Weeks in Treatment: 0 Verbal / Phone Orders: No Diagnosis Coding ICD-10 Coding Code Description [phone] Laceration without foreign body, right lower leg, initial encounter L97.812 Non-pressure chronic ulcer of other part of right lower leg with fat layer exposed N18.4 Chronic kidney disease, stage 4 (severe) I50.42 Chronic combined systolic (congestive) and diastolic (congestive) heart failure 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 right lower extremity I73.89 Other specified peripheral vascular diseases Follow-up Appointments Return Appointment in 1 week. Home Health Home Health Company: - [facility] ADMIT to Home Health for wound care. May utilize formulary equivalent dressing for wound treatment orders unless otherwise specified. Home Health Nurse may visit PRN to address patients wound care needs. CONTINUE Home Health for wound  care. May utilize formulary equivalent dressing for wound treatment orders unless otherwise specified. Home Health Nurse may visit PRN to address patients wound care needs. Scheduled days for dressing changes to be completed; exception, patient has scheduled wound care visit that day. **Please direct any NON-WOUND related issues/requests for orders to patient's Primary Care Physician. **If current dressing causes regression in wound condition, may D/C ordered dressing product/s and apply Normal Saline Moist Dressing daily until next Wound Healing Center or Other MD appointment. **Notify Wound Healing Center of regression in wound condition at [phone]. Bathing/ Shower/ Hygiene May shower; gently cleanse wound with antibacterial soap, rinse and pat dry prior to dressing wounds - using dial soap No  tub bath. Anesthetic (Use 'Patient Medications' Section for Anesthetic Order Entry) Lidocaine applied to wound bed Edema Control - Lymphedema / Segmental Compressive Device / Other Tubigrip single layer applied. - size F Wound Treatment Wound #1 - Lower Leg Wound Laterality: Right Cleanser: Soap and Water Every Other Day/30 Days Discharge Instructions: Gently cleanse wound with antibacterial soap, rinse and pat dry prior to dressing wounds Prim Dressing: Xeroform 5x9-HBD (in/in) Every Other Day/30 Days ary Discharge Instructions: Apply Xeroform 5x9-HBD (in/in) as directed Double layer Secondary Dressing: ABD Pad 5x9 (in/in) Every Other Day/30 Days Discharge Instructions: Cover with ABD pad Secured With: Medipore T - 826M Medipore H Soft Cloth Surgical T ape ape, 2x2 (in/yd) Every Other Day/30 Days Secured With: Conform 4'' - Conforming Stretch Gauze Bandage 4x75 (in/in) Every Other Day/30 Days Discharge Instructions: Apply as directed Secured With: Tubigrip Size F, 4x10 (in/yd) Every Other Day/30 Days Discharge Instructions: Apply Tubigrip F 3 finger-widths below knee to base of toes to secure  dressing and/or for swelling. Single layer Wound #2 - Lower Leg Wound Laterality: Right, Lateral, Proximal Cleanser: Soap and Water Every Other Day/30 Days Discharge Instructions: Gently cleanse wound with antibacterial soap, rinse and pat dry prior to dressing wounds Prim Dressing: Xeroform-HBD 2x2 (in/in) Every Other Day/30 Days ary Discharge Instructions: Apply Xeroform-HBD 2x2 (in/in) as directed Double layer Secondary Dressing: ABD Pad 5x9 (in/in) Every Other Day/30 Days Discharge Instructions: Cover with ABD pad Secured With: Medipore T - 826M Medipore H Soft Cloth Surgical T ape ape, 2x2 (in/yd) Every Other Day/30 Days Secured With: Conform 4'' - Conforming Stretch Gauze Bandage 4x75 (in/in) Every Other Day/30 Days Discharge Instructions: Apply as directed Secured With: Tubigrip Size F, 4x10 (in/yd) Every Other Day/30 Days BEDELIA, PONG ([id number]) [phone].pdf Page 4 of 9 Discharge Instructions: Apply Tubigrip F 3 finger-widths below knee to base of toes to secure dressing and/or for swelling. Single layer Electronic Signature(s) Signed: 07/07/2022 4:43:19 PM By: Angelina Pih Signed: 07/08/2022 6:21:14 PM By: Allen Derry PA-C Entered By: Angelina Pih on 07/07/2022 09:52:59 -------------------------------------------------------------------------------- Problem List Details Patient Name: Date of Service: Carol Kent 07/07/2022 8:15 A M Medical Record Number: [id number] Patient Account Number: [id number] Date of Birth/Sex: Treating RN: 05-20-1940 (82 y.o. Female) Angelina Pih Primary Care Provider: [name] Other Clinician: Referring Provider: Treating Provider/Extender: Allen Derry [facility], KELLY Weeks in Treatment: 0 Active Problems ICD-10 Encounter Code Description Active Date MDM Diagnosis S81.811A Laceration without foreign body, right lower leg, initial encounter 07/07/2022 No Yes L97.812 Non-pressure chronic ulcer of other  part of right lower leg with fat layer 07/07/2022 No Yes exposed N18.4 Chronic kidney disease, stage 4 (severe) 07/07/2022 No Yes I50.42 Chronic combined systolic (congestive) and diastolic (congestive) heart failure 07/07/2022 No Yes 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 right 07/07/2022 No Yes lower extremity I73.89 Other specified peripheral vascular diseases 07/07/2022 No Yes Inactive Problems Resolved Problems Electronic Signature(s) Signed: 07/07/2022 9:30:26 AM By: Lenda Kelp PA-C Entered By: Lenda Kelp on 07/07/2022 09:30:26 -------------------------------------------------------------------------------- Progress Note Details Patient Name: Date of Service: Carol Kent 07/07/2022 8:15 A M Medical Record Number: [id number] Patient Account Number: [id number] Date of Birth/Sex: Treating RN: February 21, 1941 (82 y.o. Female) Angelina Pih Primary Care Provider: [name] Other Clinician: Referring Provider: Treating Provider/Extender: Allen Derry [facility], KELLY Weeks in Treatment: [address] PAILYN, [place] ([id number]) [id number] Page 5 of 9 Chief Complaint Information obtained from Patient Right LE Ulcers/Skin Tears History of Present Illness (HPI) 07-07-2022  upon evaluation today patient appears to be doing somewhat poorly in regard to wounds that actually began in the right leg as a result of trauma. She tells me that she has been having some discomfort it is not hurting too bad however. She was originally placed on Keflex but she is finished this. She did not go to the ER to have this checked out for 3 days after it initially happened so they were not able to Steri-Strip anything back down. She has been using bacitracin and Xeroform. She does have family members that have been helping her with dressing changes. With that being said she does not appear to have any signs of infection right now but  nonetheless does have obviously the wounds that need to be addressed. Patient does have a history of chronic kidney disease stage IV, congestive heart failure, lower extremity venous insufficiency and swelling although this is not too significant, and peripheral vascular disease as ABIs are noncompressible. She does seem to have good blood flow however. Her capillary refill was good and the wounds themselves do seem to be bleeding even with light cleaning. Patient History Information obtained from Patient, Caregiver. Allergies codeine (Reaction: nausea), erythromycin base (Reaction: nausea), fentanyl, morphine, Orencia, propoxyphene, Sulfa (Sulfonamide Antibiotics), penicillin (Reaction: rash) Social History Former smoker - quit 82 years old, Alcohol Use - Never, Drug Use - No History, Caffeine Use - Daily - coffee. Medical History Eyes Patient has history of Cataracts Hematologic/Lymphatic Patient has history of Anemia Cardiovascular Patient has history of Congestive Heart Failure Musculoskeletal Patient has history of Rheumatoid Arthritis Review of Systems (ROS) Constitutional Symptoms (General Health) Complains or has symptoms of Fatigue. Ear/Nose/Mouth/Throat Denies complaints or symptoms of Difficult clearing ears, Sinusitis. Respiratory Denies complaints or symptoms of Chronic or frequent coughs, Shortness of Breath. Gastrointestinal Denies complaints or symptoms of Frequent diarrhea, Nausea, Vomiting. Endocrine Denies complaints or symptoms of Hepatitis, Thyroid disease, Polydypsia (Excessive Thirst). Genitourinary chronic kidney disease stage IV Immunological Denies complaints or symptoms of Hives, Itching. Integumentary (Skin) frequent skin tears Neurologic confusion brain aneurysm Psychiatric Denies complaints or symptoms of Anxiety, Claustrophobia. Objective Constitutional sitting or standing blood pressure is within target range for patient.. pulse regular and  within target range for patient.[name] respirations regular, non-labored and within target range for patient.[name] temperature within target range for patient.. Well-nourished and well-hydrated in no acute distress. Vitals Time Taken: 8:30 AM, Height: 62 in, Source: Stated, Weight: 150 lbs, Source: Stated, BMI: 27.4, Temperature: 98.2 F, Pulse: 92 bpm, Respiratory Rate: 18 breaths/min, Blood Pressure: 135/52 mmHg. Eyes conjunctiva clear no eyelid edema noted. pupils equal round and reactive to light and accommodation. Ears, Nose, Mouth, and Throat no gross abnormality of ear auricles or external auditory canals. normal hearing noted during conversation. mucus membranes moist. Respiratory normal breathing without difficulty. Cardiovascular 1+ dorsalis pedis/posterior tibialis pulses. 2+ pitting edema of the bilateral lower extremities. ADDYLYN, LUTTON ([id number]) [id number] Page 6 of 9 Musculoskeletal Patient unable to walk without assistance. no significant deformity or arthritic changes, no loss or range of motion, no clubbing. Psychiatric this patient is able to make decisions and demonstrates good insight into disease process. Alert and Oriented x 3. pleasant and cooperative. General Notes: Upon inspection patient's wounds actually did require some management today I did perform debridement in regard to the larger of the 2 wounds which again I think actually is doing quite well. In regard to the other this is an area that I debrided today we will get a continue to  monitor that but I cannot promise that we will need to be done. Fortunately I do not see any evidence of infection at this time she does have quite a bit however of significant lower extremity edema with really fragile appearing skin. Integumentary (Hair, Skin) Wound #1 status is Open. Original cause of wound was Skin T ear/Laceration. The date acquired was: 06/19/2022. The wound is located on the Right Lower  Leg. The wound measures 12cm length x 9cm width x 0.1cm depth; 84.823cm^2 area and 8.482cm^3 volume. There is Fat Layer (Subcutaneous Tissue) exposed. There is no tunneling or undermining noted. There is a medium amount of serosanguineous drainage noted. There is small (1-33%) red granulation within the wound bed. There is a large (67-100%) amount of necrotic tissue within the wound bed including Adherent Slough. Wound #2 status is Open. Original cause of wound was Skin T ear/Laceration. The date acquired was: 06/19/2022. The wound is located on the Right,Proximal,Lateral Lower Leg. The wound measures 2cm length x 0.5cm width x 0.1cm depth; 0.785cm^2 area and 0.079cm^3 volume. There is Fat Layer (Subcutaneous Tissue) exposed. There is no tunneling or undermining noted. There is a medium amount of serosanguineous drainage noted. There is medium (34-66%) red, pink granulation within the wound bed. There is a medium (34-66%) amount of necrotic tissue within the wound bed including Adherent Slough. 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 There was a Excisional Skin/Subcutaneous Tissue Debridement with a ear total area of 108 sq cm performed by Nelida Meuse., PA-C. With the following instrument(s): Curette to remove Viable and Non-Viable tissue/material. Material removed includes Subcutaneous Tissue and Slough and after achieving pain control using Lidocaine 4% T opical Solution. No specimens were  taken. A time out was conducted at 09:38, prior to the start of the procedure. A Moderate amount of bleeding was controlled with Pressure. The procedure was tolerated well. Post Debridement Measurements: 12cm length x 9cm width x 0.1cm depth; 8.482cm^3 volume. Character of Wound/Ulcer Post Debridement is stable. Post procedure Diagnosis Wound #1: Same as Pre-Procedure Plan Follow-up Appointments: Return Appointment in 1 week. Home Health: Home Health Company: - [facility] ADMIT to Home Health for wound care. May utilize formulary equivalent dressing for wound treatment orders unless otherwise specified. Home Health Nurse may visit PRN to address patientoos wound care needs. CONTINUE Home Health for wound care. May utilize formulary equivalent dressing for wound treatment orders unless otherwise specified. Home Health Nurse may visit PRN to address patientoos wound care needs. Scheduled days for dressing changes to be completed; exception, patient has scheduled wound care visit that day. **Please direct any NON-WOUND related issues/requests for orders to patient's Primary Care Physician. **If current dressing causes regression in wound condition, may D/C ordered dressing product/s and apply Normal Saline Moist Dressing daily until next Wound Healing Center or Other MD appointment. **Notify Wound Healing Center of regression in wound condition at [phone]. Bathing/ Shower/ Hygiene: May shower; gently cleanse wound with antibacterial soap, rinse and pat dry prior to dressing wounds - using dial soap No tub bath. Anesthetic (Use 'Patient Medications' Section for Anesthetic  Order Entry): Lidocaine applied to wound bed Edema Control - Lymphedema / Segmental Compressive Device / Other: Tubigrip single layer applied. - size F WOUND #1: - Lower Leg Wound Laterality: Right Cleanser: Soap and Water Every Other Day/30 Days Discharge Instructions: Gently cleanse wound with antibacterial soap, rinse  and pat dry prior to dressing wounds Prim Dressing: Xeroform 5x9-HBD (in/in) Every Other Day/30 Days ary Discharge Instructions: Apply Xeroform 5x9-HBD (in/in) as directed Double layer Secondary Dressing: ABD Pad 5x9 (in/in) Every Other Day/30 Days Discharge Instructions: Cover with ABD pad BRACHA, FRANKOWSKI ([id number]) [phone].pdf Page 7 of 9 Secured With: Medipore T - 49M Medipore H Soft Cloth Surgical T ape ape, 2x2 (in/yd) Every Other Day/30 Days Secured With: Conform 4'' - Conforming Stretch Gauze Bandage 4x75 (in/in) Every Other Day/30 Days Discharge Instructions: Apply as directed Secured With: Tubigrip Size F, 4x10 (in/yd) Every Other Day/30 Days Discharge Instructions: Apply Tubigrip F 3 finger-widths below knee to base of toes to secure dressing and/or for swelling. Single layer WOUND #2: - Lower Leg Wound Laterality: Right, Lateral, Proximal Cleanser: Soap and Water Every Other Day/30 Days Discharge Instructions: Gently cleanse wound with antibacterial soap, rinse and pat dry prior to dressing wounds Prim Dressing: Xeroform-HBD 2x2 (in/in) Every Other Day/30 Days ary Discharge Instructions: Apply Xeroform-HBD 2x2 (in/in) as directed Double layer Secondary Dressing: ABD Pad 5x9 (in/in) Every Other Day/30 Days Discharge Instructions: Cover with ABD pad Secured With: Medipore T - 49M Medipore H Soft Cloth Surgical T ape ape, 2x2 (in/yd) Every Other Day/30 Days Secured With: Conform 4'' - Conforming Stretch Gauze Bandage 4x75 (in/in) Every Other Day/30 Days Discharge Instructions: Apply as directed Secured With: Tubigrip Size F, 4x10 (in/yd) Every Other Day/30 Days Discharge Instructions: Apply Tubigrip F 3 finger-widths below knee to base of toes to secure dressing and/or for swelling. Single layer 1. Would recommend currently based on what we are seeing that we go ahead and continue to utilize the Xeroform gauze which I think is actually doing a  pretty good job here. The patient is in agreement with plan I do not believe she needs the bacitracin or antibiotic ointment any longer. 2. We use an ABD pad to cover and some roll gauze to secure in place. 3. Will use Tubigrip size F this is a much bigger size it would give a little bit of compression I do not want to do anything too tight but we will keep a close eye on things we may drop this down to the next week depending on how she does. We will see patient back for reevaluation in 1 week here in the clinic. If anything worsens or changes patient will contact our office for additional recommendations. Electronic Signature(s) Signed: 07/08/2022 8:50:21 AM By: Lenda Kelp PA-C Entered By: Lenda Kelp on 07/08/2022 08:50:21 -------------------------------------------------------------------------------- ROS/PFSH Details Patient Name: Date of Service: Carol Kent 07/07/2022 8:15 A M Medical Record Number: [id number] Patient Account Number: [id number] Date of Birth/Sex: Treating RN: June 28, 1940 (82 y.o. Female) Angelina Pih Primary Care Provider: [name] Other Clinician: Referring Provider: Treating Provider/Extender: Allen Derry [facility], KELLY Weeks in Treatment: 0 Information Obtained From Patient Caregiver Constitutional Symptoms (General Health) Complaints and Symptoms: Positive for: Fatigue Ear/Nose/Mouth/Throat Complaints and Symptoms: Negative for: Difficult clearing ears; Sinusitis Respiratory Complaints and Symptoms: Negative for: Chronic or frequent coughs; Shortness of Breath Gastrointestinal Complaints and Symptoms: Negative for: Frequent diarrhea; Nausea; Vomiting Endocrine Complaints and Symptoms: Negative for: Hepatitis; Thyroid disease; Polydypsia (Excessive Thirst) Immunological Complaints and  Symptoms: Negative for: Hives; Itching RAMONDA, GALYON ([id number]) [id number] Page 8 of 9 Psychiatric Complaints  and Symptoms: Negative for: Anxiety; Claustrophobia Eyes Medical History: Positive for: Cataracts Hematologic/Lymphatic Medical History: Positive for: Anemia Cardiovascular Medical History: Positive for: Congestive Heart Failure Genitourinary Complaints and Symptoms: Review of System Notes: chronic kidney disease stage IV Integumentary (Skin) Complaints and Symptoms: Review of System Notes: frequent skin tears Musculoskeletal Medical History: Positive for: Rheumatoid Arthritis Neurologic Complaints and Symptoms: Review of System Notes: confusion brain aneurysm Oncologic HBO Extended History Items Eyes: Cataracts Immunizations Pneumococcal Vaccine: Received Pneumococcal Vaccination: No Implantable Devices None Family and Social History Former smoker - quit 82 years old; Alcohol Use: Never; Drug Use: No History; Caffeine Use: Daily - coffee Electronic Signature(s) Signed: 07/07/2022 4:43:19 PM By: Angelina Pih Signed: 07/08/2022 6:21:14 PM By: Allen Derry PA-C Entered By: Angelina Pih on 07/07/2022 08:41:07 -------------------------------------------------------------------------------- SuperBill Details Patient Name: Date of Service: Carol Kent 07/07/2022 Medical Record Number: [id number] Patient Account Number: [id number] Date of Birth/Sex: Treating RN: [birth year] (81 y.o. 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 Other specified peripheral vascular diseases Facility Procedures : CPT4 Code: 69629528 Description: 99213 - WOUND CARE VISIT-LEV 3 EST PT Modifier: Quantity: 1 : CPT4 Code: 41324401 Description: 11042 - DEB SUBQ TISSUE 20 SQ CM/< ICD-10 Diagnosis Description 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 Non-pressure chronic ulcer of other part of right lower leg with fat layer expo Modifier: sed Quantity: 1 : CPT4 Code: [id number] Description: 11045 - DEB SUBQ TISS EA ADDL 20CM ICD-10 Diagnosis Description L97.812 Non-pressure chronic ulcer of other part of right lower leg with fat layer expo Modifier: sed Quantity: 5 Physician Procedures : CPT4 Code Description Modifier [id number] WC PHYS LEVEL 3 NEW PT 25 ICD-10 Diagnosis Description S81.811A Laceration without foreign body, right lower leg, initial encounter L97.812 Non-pressure chronic ulcer of other part of right lower leg with fat  layer exposed N18.4 Chronic kidney disease, stage 4 (severe) I50.42 Chronic combined systolic (congestive) and diastolic (congestive) heart failure Quantity: 1 : [id number] 11042 - WC PHYS SUBQ TISS 20 SQ CM ICD-10 Diagnosis Description L97.812 Non-pressure chronic ulcer of other part of right lower leg with fat layer exposed Quantity: 1 : [id number] 11045 - WC PHYS SUBQ TISS EA ADDL 20 CM ICD-10 Diagnosis Description L97.812 Non-pressure chronic ulcer of other part of right lower leg with fat layer exposed Quantity: 5 Electronic Signature(s) Signed: 07/08/2022 8:50:46 AM By: Lenda Kelp PA-C Entered By: Lenda Kelp on 07/08/2022 08:50:45

## 2022-07-09 NOTE — Patient Instructions (Addendum)
Medication Instructions:   Your physician recommends that you continue on your current medications as directed. Please refer to the Current Medication list given to you today.  Take Furosemide 40 mg tablet by mouth 3 times a week.  Lab Work:  No labs ordered today  If you have labs (blood work) drawn today and your tests are completely normal, you will receive your results only by: MyChart Message (if you have MyChart) OR A paper copy in the mail If you have any lab test that is abnormal or we need to change your treatment, we will call you to review the results.   Testing/Procedures:  Your physician has requested that you have an echocardiogram. Echocardiography is a painless test that uses sound waves to create images of your heart. It provides your doctor with information about the size and shape of your heart and how well your heart's chambers and valves are working. This procedure takes approximately one hour. There are no restrictions for this procedure. Please do NOT wear cologne, perfume, aftershave, or lotions (deodorant is allowed). Please arrive 15 minutes prior to your appointment time.    Follow-Up: At [facility], you and your health needs are our priority.  As part of our continuing mission to provide you with exceptional heart care, we have created designated Provider Care Teams.  These Care Teams include your primary Cardiologist (physician) and Advanced Practice Providers (APPs -  Physician Assistants and Nurse Practitioners) who all work together to provide you with the care you need, when you need it.  We recommend signing up for the patient portal called "MyChart".  Sign up information is provided on this After Visit Summary.  MyChart is used to connect with patients for Virtual Visits (Telemedicine).  Patients are able to view lab/test results, encounter notes, upcoming appointments, etc.  Non-urgent messages can be sent to your provider as well.   To learn  more about what you can do with MyChart, go to [email or web address].    Your next appointment:   Follow up Post echo   Provider:   You may see or one of the following Advanced Practice Providers on your designated Care Team:   Nicolasa Ducking, NP Eula Listen, PA-C Cadence Fransico Michael, PA-C [name], NP  Other Instructions  You have been referred to Nephrologist. You will be contacted by their office to schedule future appointment. If you don't hear from their office within a week, please contact them.  [facility] Telephone: [phone]

## 2022-07-10 ENCOUNTER — Encounter: Payer: Self-pay | Admitting: Cardiology

## 2022-07-11 ENCOUNTER — Ambulatory Visit: Payer: Medicare Other | Admitting: Physician Assistant

## 2022-07-14 ENCOUNTER — Encounter: Payer: Medicare Other | Admitting: Physician Assistant

## 2022-07-14 DIAGNOSIS — S81811A Laceration without foreign body, right lower leg, initial encounter: Secondary | ICD-10-CM | POA: Diagnosis not present

## 2022-07-14 NOTE — Progress Notes (Signed)
JALEXIA, PREISER ([id number]) [id number] Page 1 of 10 Visit Report for 07/14/2022 Arrival Information Details Patient Name: Date of Service: Carol Kent, Carol Kent 07/14/2022 1:00 PM Medical Record Number: [id number] Patient Account Number: [id number] Date of Birth/Sex: Treating RN: [birth year] (82 y.o. Carol Kent Primary Care Dantonio Justen: [name] Other Clinician: Referring Trelon Plush: Treating Roselia Snipe/Extender: Florian Buff in Treatment: 1 Visit Information History Since Last Visit Added or deleted any medications: No Patient Arrived: Wheel Chair Any new allergies or adverse reactions: No Arrival Time: 13:06 Had a fall or experienced change in No Accompanied By: daughter activities of daily living that may affect Transfer Assistance: EasyPivot Patient Lift risk of falls: Patient Identification Verified: Yes Hospitalized since last visit: No Secondary Verification Process Completed: Yes Has Dressing in Place as Prescribed: Yes Patient Has Alerts: Yes Has Compression in Place as Prescribed: Yes Patient Alerts: ABI non compressible Pain Present Now: No Electronic Signature(s) Unsigned Entered By: Angelina Pih on 07/14/2022 13:07:14 -------------------------------------------------------------------------------- Clinic Level of Care Assessment Details Patient Name: Date of Service: Carol Kent, Carol Kent 07/14/2022 1:00 PM Medical Record Number: [id number] Patient Account Number: [id number] Date of Birth/Sex: Treating RN: 1941-02-02 (82 y.o. Carol Kent Primary Care Derrill Bagnell: [name] Other Clinician: Referring Jeremy Ditullio: Treating Treasure Ingrum/Extender: Florian Buff in Treatment: 1 Clinic Level of Care Assessment Items TOOL 1 Quantity Score []  - 0 Use when EandM and Procedure is performed on INITIAL visit ASSESSMENTS - Nursing Assessment / Reassessment []  - 0 General Physical Exam (combine w/  comprehensive assessment (listed just below) when performed on new pt. evals) []  - 0 Comprehensive Assessment (HX, ROS, Risk Assessments, Wounds Hx, etc.) ASSESSMENTS - Wound and Skin Assessment / Reassessment []  - 0 Dermatologic / Skin Assessment (not related to wound area) ASSESSMENTS - Ostomy and/or Continence Assessment and Care []  - 0 Incontinence Assessment and Management []  - 0 Ostomy Care Assessment and Management (repouching, etc.) PROCESS - Coordination of Care []  - 0 Simple Patient / Family Education for ongoing care []  - 0 Complex (extensive) Patient / Family Education for ongoing care []  - 0 Staff obtains Chiropractor, Records, T Results / Process Orders est []  - 0 Staff telephones HHA, Nursing Homes / Clarify orders / etc []  - 0 Routine Transfer to another Facility (non-emergent condition) []  - 0 Routine Hospital Admission (non-emergent condition) []  - 0 New Admissions / Insurance Authorizations / Ordering NPWT Apligraf, etcTaka, Gaffke, Claretta Fraise ([id number]) [id number] Page 2 of 10 []  - 0 Emergency Hospital Admission (emergent condition) PROCESS - Special Needs []  - 0 Pediatric / Minor Patient Management []  - 0 Isolation Patient Management []  - 0 Hearing / Language / Visual special needs []  - 0 Assessment of Community assistance (transportation, D/C planning, etc.) []  - 0 Additional assistance / Altered mentation []  - 0 Support Surface(s) Assessment (bed, cushion, seat, etc.) INTERVENTIONS - Miscellaneous []  - 0 External ear exam []  - 0 Patient Transfer (multiple staff / Nurse, adult / Similar devices) []  - 0 Simple Staple / Suture removal (25 or less) []  - 0 Complex Staple / Suture removal (26 or more) []  - 0 Hypo/Hyperglycemic Management (do not check if billed separately) []  - 0 Ankle / Brachial Index (ABI) - do not check if billed separately Has the patient been seen at the hospital within the last three years: Yes Total  Score: 0 Level Of Care: ____ Electronic Signature(s) Unsigned Entered By: Angelina Pih on 07/14/2022 13:56:31 -------------------------------------------------------------------------------- Encounter Discharge Information Details Patient  Name: Date of Service: Carol Kent, Carol Kent 07/14/2022 1:00 PM Medical Record Number: [id number] Patient Account Number: [id number] Date of Birth/Sex: Treating RN: [birth year] (82 y.o. Carol Kent Primary Care Kashmere Daywalt: [name] Other Clinician: Referring Chantae Soo: Treating Drago Hammonds/Extender: Florian Buff in Treatment: 1 Encounter Discharge Information Items Post Procedure Vitals Discharge Condition: Stable Temperature (F): 98.4 Ambulatory Status: Wheelchair Pulse (bpm): 103 Discharge Destination: Home Respiratory Rate (breaths/min): 18 Transportation: Private Auto Blood Pressure (mmHg): 134/46 Accompanied By: family Schedule Follow-up Appointment: Yes Clinical Summary of Care: Electronic Signature(s) Unsigned Entered By: Angelina Pih on 07/14/2022 14:00:30 -------------------------------------------------------------------------------- Lower Extremity Assessment Details Patient Name: Date of Service: Carol Kent, Carol Kent 07/14/2022 1:00 PM Medical Record Number: [id number] Patient Account Number: [id number] Date of Birth/Sex: Treating RN: July 10, 1940 (82 y.o. Carol Kent Primary Care Jannat Rosemeyer: [name] Other Clinician: Referring Zerina Hallinan: Treating Dellanira Dillow/Extender: Florian Buff in Treatment: 1 DARRYL, BLUMENSTEIN ([id number]) [id number] Page 3 of 10 Edema Assessment Assessed: [Left: No] [Right: No] Edema: [Left: Ye] [Right: s] Calf Left: Right: Point of Measurement: 33 cm From Medial Instep 39.5 cm Ankle Left: Right: Point of Measurement: 10 cm From Medial Instep 27 cm Vascular Assessment Pulses: Dorsalis Pedis Doppler Audible:  [Right:Yes] Posterior Tibial Doppler Audible: [Right:Yes] Electronic Signature(s) Unsigned Entered By: Angelina Pih on 07/14/2022 13:14:05 -------------------------------------------------------------------------------- Multi Wound Chart Details Patient Name: Date of Service: Carol Kent, Carol Kent 07/14/2022 1:00 PM Medical Record Number: [id number] Patient Account Number: [id number] Date of Birth/Sex: Treating RN: 17-Oct-1940 (82 y.o. Carol Kent Primary Care Armstead Heiland: [name] Other Clinician: Referring Shifra Swartzentruber: Treating Cherelle Midkiff/Extender: Florian Buff in Treatment: 1 Vital Signs Height(in): 62 Pulse(bpm): 103 Weight(lbs): 150 Blood Pressure(mmHg): 134/46 Body Mass Index(BMI): 27.4 Temperature(F): 98.4 Respiratory Rate(breaths/min): 18 [1:Photos:] [N/A:N/A] Right Lower Leg Right, Proximal, Lateral Lower Leg N/A Wound Location: Skin Tear/Laceration Skin Tear/Laceration N/A Wounding Event: Skin Tear Skin Tear N/A Primary Etiology: Cataracts, Anemia, Congestive Heart Cataracts, Anemia, Congestive Heart N/A Comorbid History: Failure, Rheumatoid Arthritis Failure, Rheumatoid Arthritis 06/19/2022 07/04/2022 N/A Date Acquired: 1 1 N/A Weeks of Treatment: Open Open N/A Wound Status: No No N/A Wound Recurrence: 10.7x8.6x0.1 1.6x1.8x0.2 N/A Measurements L x W x D (cm) 72.272 2.262 N/A A (cm) : rea 7.227 0.452 N/A Volume (cm) : 14.80% -188.20% N/A % Reduction in Area: 14.80% -472.20% N/A % Reduction in Volume: Full Thickness Without Exposed Full Thickness Without Exposed N/A Classification: Support Structures Support Structures Medium Medium N/A Exudate Amount: HAYVEN, CROY ([id number]) [id number] Page 4 of 10 Serosanguineous Serosanguineous N/A Exudate Type: red, brown red, brown N/A Exudate Color: Medium (34-66%) Medium (34-66%) N/A Granulation A mount: Red Red, Pink N/A Granulation  Quality: Medium (34-66%) Medium (34-66%) N/A Necrotic A mount: Fat Layer (Subcutaneous Tissue): Yes Fat Layer (Subcutaneous Tissue): Yes N/A Exposed Structures: Small (1-33%) None N/A Epithelialization: Treatment Notes Electronic Signature(s) Unsigned Entered By: Angelina Pih on 07/14/2022 13:35:22 -------------------------------------------------------------------------------- Multi-Disciplinary Care Plan Details Patient Name: Date of Service: Carol Kent, Carol Kent 07/14/2022 1:00 PM Medical Record Number: [id number] Patient Account Number: [id number] Date of Birth/Sex: Treating RN: 04-Sep-1940 (82 y.o. Carol Kent Primary Care Tambra Muller: [name] Other Clinician: Referring Jaycion Treml: Treating Secret Kristensen/Extender: Florian Buff in Treatment: 1 Active Inactive Abuse / Safety / Falls / Self Care Management Nursing Diagnoses: History of Falls Impaired physical mobility Potential for falls Potential for injury related to falls Goals: Patient will not develop complications from immobility Date Initiated: 07/07/2022 Target Resolution Date: 08/04/2022 Goal Status: Active  Patient will not experience any injury related to falls Date Initiated: 07/07/2022 Target Resolution Date: 08/04/2022 Goal Status: Active Patient will remain injury free related to falls Date Initiated: 07/07/2022 Target Resolution Date: 08/18/2022 Goal Status: Active Patient/caregiver will demonstrate safe use of adaptive devices to increase mobility Date Initiated: 07/07/2022 Target Resolution Date: 08/04/2022 Goal Status: Active Patient/caregiver will verbalize understanding of skin care regimen Date Initiated: 07/07/2022 Date Inactivated: 07/07/2022 Target Resolution Date: 07/07/2022 Goal Status: Met Patient/caregiver will verbalize/demonstrate measures taken to improve the patient's personal safety Date Initiated: 07/07/2022 Target Resolution Date: 08/11/2022 Goal Status:  Active Patient/caregiver will verbalize/demonstrate measures taken to prevent injury and/or falls Date Initiated: 07/07/2022 Target Resolution Date: 08/11/2022 Goal Status: Active Interventions: Assess Activities of Daily Living upon admission and as needed Assess fall risk on admission and as needed Assess impairment of mobility on admission and as needed per policy Assess personal safety and home safety (as indicated) on admission and as needed Provide education on fall prevention Provide education on personal and home safety Provide education on safe transfers Notes: LINEA, RASMUSSON ([id number]) [id number] Page 5 of 10 Necrotic Tissue Nursing Diagnoses: Impaired tissue integrity related to necrotic/devitalized tissue Knowledge deficit related to management of necrotic/devitalized tissue Goals: Necrotic/devitalized tissue will be minimized in the wound bed Date Initiated: 07/07/2022 Target Resolution Date: 08/04/2022 Goal Status: Active Patient/caregiver will verbalize understanding of reason and process for debridement of necrotic tissue Date Initiated: 07/07/2022 Date Inactivated: 07/07/2022 Target Resolution Date: 07/07/2022 Goal Status: Met Interventions: Assess patient pain level pre-, during and post procedure and prior to discharge Provide education on necrotic tissue and debridement process Treatment Activities: Biologic debridement : 07/07/2022 Notes: Wound/Skin Impairment Nursing Diagnoses: Impaired tissue integrity Knowledge deficit related to ulceration/compromised skin integrity Goals: Ulcer/skin breakdown will have a volume reduction of 30% by week 4 Date Initiated: 07/07/2022 Target Resolution Date: 08/04/2022 Goal Status: Active Ulcer/skin breakdown will have a volume reduction of 50% by week 8 Date Initiated: 07/07/2022 Target Resolution Date: 09/01/2022 Goal Status: Active Ulcer/skin breakdown will have a volume reduction of 80% by week  12 Date Initiated: 07/07/2022 Target Resolution Date: 09/29/2022 Goal Status: Active Ulcer/skin breakdown will heal within 14 weeks Date Initiated: 07/07/2022 Target Resolution Date: 10/13/2022 Goal Status: Active Interventions: Assess patient/caregiver ability to obtain necessary supplies Assess patient/caregiver ability to perform ulcer/skin care regimen upon admission and as needed Assess ulceration(s) every visit Provide education on ulcer and skin care Treatment Activities: Skin care regimen initiated : 07/07/2022 Notes: Electronic Signature(s) Unsigned Entered By: Angelina Pih on 07/14/2022 13:57:03 -------------------------------------------------------------------------------- Pain Assessment Details Patient Name: Date of Service: Carol Kent, Carol Kent 07/14/2022 1:00 PM Medical Record Number: [id number] Patient Account Number: [id number] Date of Birth/Sex: Treating RN: 1940/04/27 (82 y.o. Carol Kent Primary Care Vada Swift: [name] Other Clinician: Referring Nyilah Kight: Treating Martinez Boxx/Extender: Florian Buff in Treatment: 1 Active Problems Location of Pain Severity and Description of Pain Patient Has Paino No JAMYLA, MONTERA ([id number]) [id number] Page 6 of 10 Patient Has Paino No Site Locations Rate the pain. Current Pain Level: 0 Pain Management and Medication Current Pain Management: Electronic Signature(s) Unsigned Entered By: Angelina Pih on 07/14/2022 13:07:35 -------------------------------------------------------------------------------- Patient/Caregiver Education Details Patient Name: Date of Service: Carol Kent, Carol Kent 4/15/2024andnbsp1:00 PM Medical Record Number: [id number] Patient Account Number: [id number] Date of Birth/Gender: Treating RN: 1940/12/16 (82 y.o. Carol Kent Primary Care Physician: [name] Other Clinician: Referring Physician: Treating Physician/Extender:  Florian Buff in Treatment: 1 Education Assessment Education Provided  To: Patient Education Topics Provided Wound Debridement: Handouts: Wound Debridement Methods: Explain/Verbal Responses: State content correctly Wound/Skin Impairment: Handouts: Caring for Your Ulcer Methods: Explain/Verbal Responses: State content correctly Electronic Signature(s) Unsigned Entered By: Angelina Pih on 07/14/2022 13:59:43 -------------------------------------------------------------------------------- Wound Assessment Details Patient Name: Date of Service: Carol Kent, Carol Kent 07/14/2022 1:00 PM Medical Record Number: [id number] Patient Account Number: [id number] Date of Birth/Sex: Treating RN: [birth year] (82 y.o. Jordynn, Marcella, Tolono C ([id number]) [id number].pdf Page 7 of 10 Primary Care Jeb Schloemer: [name] Other Clinician: Referring Myalynn Lingle: Treating Leeum Sankey/Extender: Florian Buff in Treatment: 1 Wound Status Wound Number: 1 Primary Skin Tear Etiology: Wound Location: Right Lower Leg Wound Status: Open Wounding Event: Skin Tear/Laceration Comorbid Cataracts, Anemia, Congestive Heart Failure, Rheumatoid Date Acquired: 06/19/2022 History: Arthritis Weeks Of Treatment: 1 Clustered Wound: No Photos Wound Measurements Length: (cm) 10.7 Width: (cm) 8.6 Depth: (cm) 0.1 Area: (cm) 72.272 Volume: (cm) 7.227 % Reduction in Area: 14.8% % Reduction in Volume: 14.8% Epithelialization: Small (1-33%) Tunneling: No Undermining: No Wound Description Classification: Full Thickness Without Exposed Support Structures Exudate Amount: Medium Exudate Type: Serosanguineous Exudate Color: red, brown Foul Odor After Cleansing: No Slough/Fibrino Yes Wound Bed Granulation Amount: Medium (34-66%) Exposed Structure Granulation Quality: Red Fat Layer (Subcutaneous Tissue) Exposed: Yes Necrotic Amount: Medium  (34-66%) Necrotic Quality: Adherent Slough Treatment Notes Wound #1 (Lower Leg) Wound Laterality: Right Cleanser Soap and Water Discharge Instruction: Gently cleanse wound with antibacterial soap, rinse and pat dry prior to dressing wounds Peri-Wound Care Topical Primary Dressing Xeroform 5x9-HBD (in/in) Discharge Instruction: Apply Xeroform 5x9-HBD (in/in) as directed Double layer Secondary Dressing ABD Pad 5x9 (in/in) Discharge Instruction: Cover with ABD pad Secured With Medipore T - 68M Medipore H Soft Cloth Surgical T ape ape, 2x2 (in/yd) Conform 4'' - Conforming Stretch Gauze Bandage 4x75 (in/in) Discharge Instruction: Apply as directed Tubigrip Size F, 4x10 (in/yd) Discharge Instruction: Apply Tubigrip F 3 finger-widths below knee to base of toes to secure dressing and/or for swelling. Single layer Compression RAYNAH, GOMES ([id number]) [id number] Page 8 of 10 Compression Stockings Add-Ons Electronic Signature(s) Unsigned Entered By: Angelina Pih on 07/14/2022 13:19:56 -------------------------------------------------------------------------------- Wound Assessment Details Patient Name: Date of Service: Carol Kent, Carol Kent 07/14/2022 1:00 PM Medical Record Number: [id number] Patient Account Number: [id number] Date of Birth/Sex: Treating RN: [birth year] (82 y.o. Carol Kent Primary Care Antoninette Lerner: [name] Other Clinician: Referring Airika Alkhatib: Treating Randle Shatzer/Extender: Florian Buff in Treatment: 1 Wound Status Wound Number: 2 Primary Skin Tear Etiology: Wound Location: Right, Proximal, Lateral Lower Leg Wound Status: Open Wounding Event: Skin Tear/Laceration Comorbid Cataracts, Anemia, Congestive Heart Failure, Rheumatoid Date Acquired: 07/04/2022 History: Arthritis Weeks Of Treatment: 1 Clustered Wound: No Photos Wound Measurements Length: (cm) 0.1 Width: (cm) 0.1 Depth: (cm) 0.1 Area:  (cm) 0.008 Volume: (cm) 0.001 % Reduction in Area: 99% % Reduction in Volume: 98.7% Epithelialization: None Tunneling: No Undermining: No Wound Description Classification: Full Thickness Without Exposed Supp Exudate Amount: Medium Exudate Type: Serosanguineous Exudate Color: red, brown ort Structures Foul Odor After Cleansing: No Slough/Fibrino Yes Wound Bed Granulation Amount: None Present (0%) Exposed Structure Necrotic Amount: Large (67-100%) Fat Layer (Subcutaneous Tissue) Exposed: No Necrotic Quality: Adherent Slough Assessment Notes wound scabbed over Treatment Notes Wound #2 (Lower Leg) Wound Laterality: Right, Lateral, Proximal Cleanser Soap and Water Discharge Instruction: Gently cleanse wound with antibacterial soap, rinse and pat dry prior to dressing wounds JELITZA, MANNINEN ([id number]) [id number] Page 9 of 10 Peri-Wound Care Topical Primary Dressing Xeroform-HBD  2x2 (in/in) Discharge Instruction: Apply Xeroform-HBD 2x2 (in/in) as directed Double layer Secondary Dressing ABD Pad 5x9 (in/in) Discharge Instruction: Cover with ABD pad Secured With Medipore T - 42M Medipore H Soft Cloth Surgical T ape ape, 2x2 (in/yd) Conform 4'' - Conforming Stretch Gauze Bandage 4x75 (in/in) Discharge Instruction: Apply as directed Tubigrip Size F, 4x10 (in/yd) Discharge Instruction: Apply Tubigrip F 3 finger-widths below knee to base of toes to secure dressing and/or for swelling. Single layer Compression Wrap Compression Stockings Add-Ons Electronic Signature(s) Unsigned Entered By: Angelina Pih on 07/14/2022 13:46:00 -------------------------------------------------------------------------------- Wound Assessment Details Patient Name: Date of Service: Carol Kent, Carol Kent 07/14/2022 1:00 PM Medical Record Number: [id number] Patient Account Number: [id number] Date of Birth/Sex: Treating RN: 1940/07/17 (82 y.o. Carol Kent Primary  Care Elliott Quade: [name] Other Clinician: Referring Veera Stapleton: Treating Kelci Petrella/Extender: Florian Buff in Treatment: 1 Wound Status Wound Number: 3 Primary Skin Tear Etiology: Wound Location: Right, Distal, Medial Lower Leg Wound Status: Open Wounding Event: Skin Tear/Laceration Comorbid Cataracts, Anemia, Congestive Heart Failure, Rheumatoid Date Acquired: 07/14/2022 History: Arthritis Weeks Of Treatment: 0 Clustered Wound: No Photos Wound Measurements Length: (cm) 1.6 Width: (cm) 1.7 Depth: (cm) 0.2 Area: (cm) 2.136 Volume: (cm) 0.427 % Reduction in Area: % Reduction in Volume: Epithelialization: None Tunneling: No Undermining: No Wound Description Classification: Full Thickness Without Exposed Support Structures Exudate Amount: Medium LUISE, YAMAMOTO ([id number]) Exudate Type: Serosanguineous Exudate Color: red, brown Foul Odor After Cleansing: No Slough/Fibrino Yes [id number] Page 10 of 10 Wound Bed Granulation Amount: Large (67-100%) Exposed Structure Granulation Quality: Red, Pink Fat Layer (Subcutaneous Tissue) Exposed: Yes Necrotic Amount: Small (1-33%) Necrotic Quality: Adherent Slough Treatment Notes Wound #3 (Lower Leg) Wound Laterality: Right, Medial, Distal Cleanser Soap and Water Discharge Instruction: Gently cleanse wound with antibacterial soap, rinse and pat dry prior to dressing wounds Peri-Wound Care Topical Primary Dressing Xeroform-HBD 2x2 (in/in) Discharge Instruction: Apply Xeroform-HBD 2x2 (in/in) as directed Double layer Secondary Dressing ABD Pad 5x9 (in/in) Discharge Instruction: Cover with ABD pad Secured With Medipore T - 42M Medipore H Soft Cloth Surgical T ape ape, 2x2 (in/yd) Conform 4'' - Conforming Stretch Gauze Bandage 4x75 (in/in) Discharge Instruction: Apply as directed Tubigrip Size F, 4x10 (in/yd) Discharge Instruction: Apply Tubigrip F 3 finger-widths below knee  to base of toes to secure dressing and/or for swelling. Single layer Compression Wrap Compression Stockings Add-Ons Electronic Signature(s) Unsigned Entered By: Angelina Pih on 07/14/2022 13:47:12 -------------------------------------------------------------------------------- Vitals Details Patient Name: Date of Service: Carol Kent, FONNER 07/14/2022 1:00 PM Medical Record Number: [id number] Patient Account Number: [id number] Date of Birth/Sex: Treating RN: 1941/03/11 (82 y.o. Carol Kent Primary Care Jaylani Mcguinn: [name] Other Clinician: Referring Lear Carstens: Treating Anushri Casalino/Extender: Florian Buff in Treatment: 1 Vital Signs Time Taken: 13:05 Temperature (F): 98.4 Height (in): 62 Pulse (bpm): 103 Weight (lbs): 150 Respiratory Rate (breaths/min): 18 Body Mass Index (BMI): 27.4 Blood Pressure (mmHg): 134/46 Reference Range: 80 - 120 mg / dl Electronic Signature(s) Unsigned Entered By: Angelina Pih on 07/14/2022 13:07:28 Signature(s): Date(s):

## 2022-07-14 NOTE — Progress Notes (Signed)
Carol Kent, Carol Kent ([id number]) [id number] Page 1 of 7 Visit Report for 07/14/2022 Chief Complaint Document Details Patient Name: Date of Service: OFA, ISSA 07/14/2022 1:00 PM Medical Record Number: [id number] Patient Account Number: [id number] Date of Birth/Sex: Treating RN: 27-Dec-1940 (82 y.o. [name] Primary Care Provider: [name] Other Clinician: Referring Provider: Treating Provider/Extender: Florian Buff in Treatment: 1 Information Obtained from: Patient Chief Complaint Right LE Ulcers/Skin Tears Electronic Signature(s) Signed: 07/14/2022 1:03:55 PM By: Allen Derry PA-C Entered By: Allen Derry on 07/14/2022 13:03:55 -------------------------------------------------------------------------------- Debridement Details Patient Name: Date of Service: Carol Kent 07/14/2022 1:00 PM Medical Record Number: [id number] Patient Account Number: [id number] Date of Birth/Sex: Treating RN: 01-13-1941 (82 y.o. [name] Primary Care Provider: [name] Other Clinician: Referring Provider: Treating Provider/Extender: Florian Buff in Treatment: 1 Debridement Performed for Assessment: Wound #1 Right Lower Leg Performed By: Physician Allen Derry, PA-C Debridement Type: Debridement Level of Consciousness (Pre-procedure): Awake and Alert Pre-procedure Verification/Time Out Yes - 13:38 Taken: Pain Control: Lidocaine 4% T opical Solution T Area Debrided (L x W): otal 5 (cm) x 5 (cm) = 25 (cm) Tissue and other material debrided: Viable, Non-Viable, Slough, Subcutaneous, Slough Level: Skin/Subcutaneous Tissue Debridement Description: Excisional Instrument: Curette Bleeding: Moderate Hemostasis Achieved: Pressure Response to Treatment: Procedure was tolerated well Level of Consciousness (Post- Awake and Alert procedure): Post Debridement Measurements of Total Wound Length: (cm)  10.7 Width: (cm) 8.6 Depth: (cm) 0.1 Volume: (cm) 7.227 Character of Wound/Ulcer Post Debridement: Stable Post Procedure Diagnosis Same as Pre-procedure Electronic Signature(s) Unsigned Entered By: Angelina Pih on 07/14/2022 13:43:58 [name] ([id number]) [id number] Page 2 of 7 -------------------------------------------------------------------------------- HPI Details Patient Name: Date of Service: Carol Kent, Carol Kent 07/14/2022 1:00 PM Medical Record Number: [id number] Patient Account Number: [id number] Date of Birth/Sex: Treating RN: December 12, 1940 (82 y.o. [name] Primary Care Provider: [name] Other Clinician: Referring Provider: Treating Provider/Extender: Florian Buff in Treatment: 1 History of Present Illness HPI Description: 07-07-2022 upon evaluation today patient appears to be doing somewhat poorly in regard to wounds that actually began in the right leg as a result of trauma. She tells me that she has been having some discomfort it is not hurting too bad however. She was originally placed on Keflex but she is finished this. She did not go to the ER to have this checked out for 3 days after it initially happened so they were not able to Steri-Strip anything back down. She has been using bacitracin and Xeroform. She does have family members that have been helping her with dressing changes. With that being said she does not appear to have any signs of infection right now but nonetheless does have obviously the wounds that need to be addressed. Patient does have a history of chronic kidney disease stage IV, congestive heart failure, lower extremity venous insufficiency and swelling although this is not too significant, and peripheral vascular disease as ABIs are noncompressible. She does seem to have good blood flow however. Her capillary refill was good and the wounds themselves do seem to be bleeding even  with light cleaning. 07-14-2022 upon evaluation today patient appears to be doing well currently in regard to her wounds. She is actually showing signs of improvement which is good news. Fortunately there does not appear to be any signs of active infection locally nor systemically which is great news. No fevers, chills, nausea, vomiting, or diarrhea. Electronic Signature(s) Signed: 07/14/2022 1:48:22  PM By: Allen Derry PA-C Entered By: Allen Derry on 07/14/2022 13:48:22 -------------------------------------------------------------------------------- Physical Exam Details Patient Name: Date of Service: Carol Kent, Carol Kent 07/14/2022 1:00 PM Medical Record Number: [id number] Patient Account Number: [id number] Date of Birth/Sex: Treating RN: [birth year] (82 y.o. [name] Primary Care Provider: [name] Other Clinician: Referring Provider: Treating Provider/Extender: Florian Buff in Treatment: 1 Constitutional Well-nourished and well-hydrated in no acute distress. Respiratory normal breathing without difficulty. Psychiatric this patient is able to make decisions and demonstrates good insight into disease process. Alert and Oriented x 3. pleasant and cooperative. Notes Upon inspection patient's wound bed showed signs of good granulation epithelization at this point. Fortunately I do not see any evidence of active infection locally nor systemically which is great news and overall I do believe that removing in the right direction. Electronic Signature(s) Signed: 07/14/2022 1:48:43 PM By: Allen Derry PA-C Entered By: Allen Derry on 07/14/2022 13:48:43 -------------------------------------------------------------------------------- Physician Orders Details Patient Name: Date of Service: Carol Kent 07/14/2022 1:00 PM Medical Record Number: [id number] Patient Account Number: [id number] Date of Birth/Sex: Treating RN: [birth year] (82 y.o. [name] Primary Care Provider: [name] Other Clinician: Referring Provider: Treating Provider/Extender: Florian Buff in Treatment: 1 Verbal / Phone Orders: No Diagnosis Coding Carol Kent, Carol Kent ([id number]) [id number] Page 3 of 7 ICD-10 Coding Code Description [phone] Laceration without foreign body, right lower leg, initial encounter L97.812 Non-pressure chronic ulcer of other part of right lower leg with fat layer exposed N18.4 Chronic kidney disease, stage 4 (severe) I50.42 Chronic combined systolic (congestive) and diastolic (congestive) heart failure 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 right lower extremity I73.89 Other specified peripheral vascular diseases Follow-up Appointments Return Appointment in 1 week. Home Health Home Health Company: - [facility] ADMIT to Home Health for wound care. May utilize formulary equivalent dressing for wound treatment orders unless otherwise specified. Home Health Nurse may visit PRN to address patients wound care needs. CONTINUE Home Health for wound care. May utilize formulary equivalent dressing for wound treatment orders unless otherwise specified. Home Health Nurse may visit PRN to address patients wound care needs. Scheduled days for dressing changes to be completed; exception, patient has scheduled wound care visit that day. **Please direct any NON-WOUND related issues/requests for orders to patient's Primary Care Physician. **If current dressing causes regression in wound condition, may D/C ordered dressing product/s and apply Normal Saline Moist Dressing daily until next Wound Healing Center or Other MD appointment. **Notify Wound Healing Center of regression in wound condition at [phone]. Other Home Health Orders/Instructions: - for tubi grip please place on leg just above toes and about two fingers width below knee, for swelling  in lower leg Bathing/ Shower/ Hygiene May shower; gently cleanse wound with antibacterial soap, rinse and pat dry prior to dressing wounds - using dial soap No tub bath. Anesthetic (Use 'Patient Medications' Section for Anesthetic Order Entry) Lidocaine applied to wound bed Edema Control - Lymphedema / Segmental Compressive Device / Other Tubigrip single layer applied. - size F Wound Treatment Wound #1 - Lower Leg Wound Laterality: Right Cleanser: Soap and Water Every Other Day/30 Days Discharge Instructions: Gently cleanse wound with antibacterial soap, rinse and pat dry prior to dressing wounds Prim Dressing: Xeroform 5x9-HBD (in/in) Every Other Day/30 Days ary Discharge Instructions: Apply Xeroform 5x9-HBD (in/in) as directed Double layer Secondary Dressing: ABD Pad 5x9 (in/in) Every Other Day/30 Days Discharge Instructions: Cover with ABD pad Secured With:  Medipore T - 41M Medipore H Soft Cloth Surgical T ape ape, 2x2 (in/yd) Every Other Day/30 Days Secured With: Conform 4'' - Conforming Stretch Gauze Bandage 4x75 (in/in) Every Other Day/30 Days Discharge Instructions: Apply as directed Secured With: Tubigrip Size F, 4x10 (in/yd) Every Other Day/30 Days Discharge Instructions: Apply Tubigrip F 3 finger-widths below knee to base of toes to secure dressing and/or for swelling. Single layer Wound #2 - Lower Leg Wound Laterality: Right, Lateral, Proximal Cleanser: Soap and Water Every Other Day/30 Days Discharge Instructions: Gently cleanse wound with antibacterial soap, rinse and pat dry prior to dressing wounds Prim Dressing: Xeroform-HBD 2x2 (in/in) Every Other Day/30 Days ary Discharge Instructions: Apply Xeroform-HBD 2x2 (in/in) as directed Double layer Secondary Dressing: ABD Pad 5x9 (in/in) Every Other Day/30 Days Discharge Instructions: Cover with ABD pad Secured With: Medipore T - 41M Medipore H Soft Cloth Surgical T ape ape, 2x2 (in/yd) Every Other Day/30 Days Secured With:  Conform 4'' - Conforming Stretch Gauze Bandage 4x75 (in/in) Every Other Day/30 Days Discharge Instructions: Apply as directed Secured With: Tubigrip Size F, 4x10 (in/yd) Every Other Day/30 Days Discharge Instructions: Apply Tubigrip F 3 finger-widths below knee to base of toes to secure dressing and/or for swelling. Single layer Wound #3 - Lower Leg Wound Laterality: Right, Medial, Distal Cleanser: Soap and Water Every Other Day/30 Days Discharge Instructions: Gently cleanse wound with antibacterial soap, rinse and pat dry prior to dressing wounds Carol Kent, Carol Kent ([id number]) [id number] Page 4 of 7 Prim Dressing: Xeroform-HBD 2x2 (in/in) Every Other Day/30 Days ary Discharge Instructions: Apply Xeroform-HBD 2x2 (in/in) as directed Double layer Secondary Dressing: ABD Pad 5x9 (in/in) Every Other Day/30 Days Discharge Instructions: Cover with ABD pad Secured With: Medipore T - 41M Medipore H Soft Cloth Surgical T ape ape, 2x2 (in/yd) Every Other Day/30 Days Secured With: Conform 4'' - Conforming Stretch Gauze Bandage 4x75 (in/in) Every Other Day/30 Days Discharge Instructions: Apply as directed Secured With: Tubigrip Size F, 4x10 (in/yd) Every Other Day/30 Days Discharge Instructions: Apply Tubigrip F 3 finger-widths below knee to base of toes to secure dressing and/or for swelling. Single layer Electronic Signature(s) Unsigned Entered By: Angelina Pih on 07/14/2022 13:56:08 -------------------------------------------------------------------------------- Problem List Details Patient Name: Date of Service: Carol Kent, Carol Kent 07/14/2022 1:00 PM Medical Record Number: [id number] Patient Account Number: [id number] Date of Birth/Sex: Treating RN: 1940/04/16 (82 y.o. [name] Primary Care Provider: [name] Other Clinician: Referring Provider: Treating Provider/Extender: Florian Buff in Treatment: 1 Active  Problems ICD-10 Encounter Code Description Active Date MDM Diagnosis S81.811A Laceration without foreign body, right lower leg, initial encounter 07/07/2022 No Yes L97.812 Non-pressure chronic ulcer of other part of right lower leg with fat layer 07/07/2022 No Yes exposed N18.4 Chronic kidney disease, stage 4 (severe) 07/07/2022 No Yes I50.42 Chronic combined systolic (congestive) and diastolic (congestive) heart failure 07/07/2022 No Yes 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 right 07/07/2022 No Yes lower extremity I73.89 Other specified peripheral vascular diseases 07/07/2022 No Yes Inactive Problems Resolved Problems Electronic Signature(s) Signed: 07/14/2022 1:03:52 PM By: Allen Derry PA-C Entered By: Allen Derry on 07/14/2022 13:03:52 [name] ([id number]) [id number] Page 5 of 7 -------------------------------------------------------------------------------- Progress Note Details Patient Name: Date of Service: Carol Kent, Carol Kent 07/14/2022 1:00 PM Medical Record Number: [id number] Patient Account Number: [id number] Date of Birth/Sex: Treating RN: [birth year] (82 y.o. [name] Primary Care Provider: [name] Other Clinician: Referring Provider: Treating Provider/Extender: Florian Buff in  Treatment: 1 Subjective Chief Complaint Information obtained from Patient Right LE Ulcers/Skin Tears History of Present Illness (HPI) 07-07-2022 upon evaluation today patient appears to be doing somewhat poorly in regard to wounds that actually began in the right leg as a result of trauma. She tells me that she has been having some discomfort it is not hurting too bad however. She was originally placed on Keflex but she is finished this. She did not go to the ER to have this checked out for 3 days after it initially happened so they were not able to Steri-Strip anything back down. She has been  using bacitracin and Xeroform. She does have family members that have been helping her with dressing changes. With that being said she does not appear to have any signs of infection right now but nonetheless does have obviously the wounds that need to be addressed. Patient does have a history of chronic kidney disease stage IV, congestive heart failure, lower extremity venous insufficiency and swelling although this is not too significant, and peripheral vascular disease as ABIs are noncompressible. She does seem to have good blood flow however. Her capillary refill was good and the wounds themselves do seem to be bleeding even with light cleaning. 07-14-2022 upon evaluation today patient appears to be doing well currently in regard to her wounds. She is actually showing signs of improvement which is good news. Fortunately there does not appear to be any signs of active infection locally nor systemically which is great news. No fevers, chills, nausea, vomiting, or diarrhea. Objective Constitutional Well-nourished and well-hydrated in no acute distress. Vitals Time Taken: 1:05 PM, Height: 62 in, Weight: 150 lbs, BMI: 27.4, Temperature: 98.4 F, Pulse: 103 bpm, Respiratory Rate: 18 breaths/min, Blood Pressure: 134/46 mmHg. Respiratory normal breathing without difficulty. Psychiatric this patient is able to make decisions and demonstrates good insight into disease process. Alert and Oriented x 3. pleasant and cooperative. General Notes: Upon inspection patient's wound bed showed signs of good granulation epithelization at this point. Fortunately I do not see any evidence of active infection locally nor systemically which is great news and overall I do believe that removing in the right direction. Integumentary (Hair, Skin) Wound #1 status is Open. Original cause of wound was Skin T ear/Laceration. The date acquired was: 06/19/2022. The wound has been in treatment 1 weeks. The wound is located on  the Right Lower Leg. The wound measures 10.7cm length x 8.6cm width x 0.1cm depth; 72.272cm^2 area and 7.227cm^3 volume. There is Fat Layer (Subcutaneous Tissue) exposed. There is no tunneling or undermining noted. There is a medium amount of serosanguineous drainage noted. There is medium (34-66%) red granulation within the wound bed. There is a medium (34-66%) amount of necrotic tissue within the wound bed including Adherent Slough. Wound #2 status is Open. Original cause of wound was Skin T ear/Laceration. The date acquired was: 07/04/2022. The wound has been in treatment 1 weeks. The wound is located on the Right,Proximal,Lateral Lower Leg. The wound measures 0.1cm length x 0.1cm width x 0.1cm depth; 0.008cm^2 area and 0.001cm^3 volume. There is no tunneling or undermining noted. There is a medium amount of serosanguineous drainage noted. There is no granulation within the wound bed. There is a large (67-100%) amount of necrotic tissue within the wound bed including Adherent Slough. General Notes: wound scabbed over Wound #3 status is Open. Original cause of wound was Skin T ear/Laceration. The date acquired was: 07/14/2022. The wound is located on the Right,Distal,Medial Lower Leg.  The wound measures 1.6cm length x 1.7cm width x 0.2cm depth; 2.136cm^2 area and 0.427cm^3 volume. There is Fat Layer (Subcutaneous Tissue) exposed. There is no tunneling or undermining noted. There is a medium amount of serosanguineous drainage noted. There is large (67-100%) red, pink granulation within the wound bed. There is a small (1-33%) amount of necrotic tissue within the wound bed including Adherent Slough. 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 There was a Excisional Skin/Subcutaneous Tissue Debridement with a ear total area of 25 sq cm performed by Allen Derry, PA-C. With the following instrument(s): Curette to remove Viable and Non-Viable tissue/material. Material removed includes Subcutaneous Tissue and Slough and after achieving pain control using Lidocaine 4% T opical Solution. No specimens were taken. A time out was conducted at 13:38, prior to the start of the procedure. A Moderate amount of bleeding was controlled with Pressure. The procedure was tolerated well. Post Debridement Measurements: 10.7cm length x 8.6cm width x 0.1cm depth; 7.227cm^3 volume. Character of Wound/Ulcer Post Debridement is stable. Post procedure Diagnosis Wound #1: Same as Pre-Procedure Plan 1. I am going to suggest that after debridement today I think we are still on the right track with the Xeroform she is making some excellent progress I am very pleased in that regard. 2. I am also can recommend the patient should continue to monitor for any signs of infection or worsening. Overall I think that we are on the right track like I said with regard to healing also think from an infection standpoint I see nothing that appears to be infected which is great news. We will see patient back for reevaluation in 1 week here in the clinic. If anything worsens or changes patient will contact our office for additional recommendations. Electronic Signature(s) Signed: 07/14/2022 1:49:10 PM By: Allen Derry PA-C Entered By: Allen Derry on 07/14/2022  13:49:09 -------------------------------------------------------------------------------- SuperBill Details Patient Name: Date of Service: Carol Kent 07/14/2022 Medical Record Number: [id number] Patient Account Number: [id number] Date of Birth/Sex: Treating RN: 16-Nov-1940 (82 y.o. [name] Primary Care Provider: [name] Other Clinician: Referring Provider: Treating Provider/Extender: Florian Buff in Treatment: 1 Diagnosis Coding ICD-10 Codes Code Description [phone] Laceration without foreign body, right lower leg, initial encounter L97.812 Non-pressure chronic ulcer of other part of right lower leg with fat layer exposed N18.4 Chronic kidney disease, stage 4 (severe) I50.42 Chronic combined systolic (congestive) and diastolic (congestive) heart failure 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 right lower extremity I73.89 Other specified peripheral vascular diseases Facility Procedures : CPT4 Code: [id number] Description: 11042 - DEB SUBQ TISSUE 20 SQ CM/< ICD-10 Diagnosis Description L97.812 Non-pressure chronic ulcer of other part of right lower leg with fat layer expo Modifier: sed Quantity: 1 : Laurita Quint Code: [id number] N, Epifania C [id number] Description: 11045 - DEB SUBQ TISS EA ADDL 20CM ICD-10 Diagnosis Description L97.812 Non-pressure chronic ulcer of other part of right lower leg with fat layer expo 9) [phone] Modifier: sed [id number] P Quantity: 1 age 24  of 7 Physician Procedures : CPT4 Code Description Modifier [id number] 11042 - WC PHYS SUBQ TISS 20 SQ CM ICD-10 Diagnosis Description L97.812 Non-pressure chronic ulcer of other part of right lower leg with fat layer exposed Quantity: 1 : [id number] 11045 - WC PHYS SUBQ TISS EA ADDL 20 CM ICD-10 Diagnosis Description L97.812 Non-pressure chronic ulcer of other part of right lower leg with fat layer exposed Quantity:  1 Electronic Signature(s) Unsigned Previous Signature: 07/14/2022 1:49:30 PM Version By: Allen Derry PA-C Entered By: Angelina Pih on 07/14/2022 13:56:39 Signature(s): Date(s):

## 2022-07-21 ENCOUNTER — Ambulatory Visit: Payer: Medicare Other | Admitting: Physician Assistant

## 2022-07-28 ENCOUNTER — Encounter: Payer: Medicare Other | Admitting: Physician Assistant

## 2022-07-28 DIAGNOSIS — S81811A Laceration without foreign body, right lower leg, initial encounter: Secondary | ICD-10-CM | POA: Diagnosis not present

## 2022-07-28 NOTE — Progress Notes (Signed)
MANDIE, Kent ([id number]) [id number] Page 1 of 11 Visit Report for 07/28/2022 Arrival Information Details Patient Name: Date of Service: Carol Kent, Carol Kent 07/28/2022 12:45 PM Medical Record Number: [id number] Patient Account Number: [id number] Date of Birth/Sex: Treating RN: [birth year] (82 y.o. Carol Kent Primary Care Carol Kent: Carol Kent Other Clinician: Betha Kent Referring Carol Kent: Treating Carol Kent/Extender: Carol Kent in Treatment: 3 Visit Information History Since Last Visit All ordered tests and consults were completed: No Patient Arrived: Ambulatory Added or deleted any medications: No Arrival Time: 12:51 Any new allergies or adverse reactions: No Transfer Assistance: None Had a fall or experienced change in No Patient Identification Verified: Yes activities of daily living that may affect Secondary Verification Process Completed: Yes risk of falls: Patient Has Alerts: Yes Signs or symptoms of abuse/neglect since last visito No Patient Alerts: ABI non compressible Hospitalized since last visit: No Implantable device outside of the clinic excluding No cellular tissue based products placed in the center since last visit: Has Dressing in Place as Prescribed: Yes Has Compression in Place as Prescribed: Yes Pain Present Now: No Electronic Signature(s) Unsigned Entered ByBetha Kent on 07/28/2022 12:53:58 -------------------------------------------------------------------------------- Clinic Level of Care Assessment Details Patient Name: Date of Service: Carol, Kent 07/28/2022 12:45 PM Medical Record Number: [id number] Patient Account Number: [id number] Date of Birth/Sex: Treating RN: 30-Sep-1940 (82 y.o. Carol Kent Primary Care Carol Kent: Carol Kent Other Clinician: Betha Kent Referring Petrona Wyeth: Treating Carol Kent/Extender: Carol Kent in Treatment:  3 Clinic Level of Care Assessment Items TOOL 4 Quantity Score []  - 0 Use when only an EandM is performed on FOLLOW-UP visit ASSESSMENTS - Nursing Assessment / Reassessment X- 1 10 Reassessment of Co-morbidities (includes updates in patient status) X- 1 5 Reassessment of Adherence to Treatment Plan ASSESSMENTS - Wound and Skin A ssessment / Reassessment []  - 0 Simple Wound Assessment / Reassessment - one wound X- 3 5 Complex Wound Assessment / Reassessment - multiple wounds []  - 0 Dermatologic / Skin Assessment (not related to wound area) ASSESSMENTS - Focused Assessment []  - 0 Circumferential Edema Measurements - multi extremities []  - 0 Nutritional Assessment / Counseling / Intervention []  - 0 Lower Extremity Assessment (monofilament, tuning fork, pulses) []  - 0 Peripheral Arterial Disease Assessment (using hand held doppler) ASSESSMENTS - Ostomy and/or Continence Assessment and Care []  - 0 Incontinence Assessment and Management Carol Kent, Carol Kent ([id number]) [id number] Page 2 of 11 []  - 0 Ostomy Care Assessment and Management (repouching, etc.) PROCESS - Coordination of Care X - Simple Patient / Family Education for ongoing care 1 15 []  - 0 Complex (extensive) Patient / Family Education for ongoing care []  - 0 Staff obtains Chiropractor, Records, T Results / Process Orders est []  - 0 Staff telephones HHA, Nursing Homes / Clarify orders / etc []  - 0 Routine Transfer to another Facility (non-emergent condition) []  - 0 Routine Hospital Admission (non-emergent condition) []  - 0 New Admissions / Manufacturing engineer / Ordering NPWT Apligraf, etc. , []  - 0 Emergency Hospital Admission (emergent condition) X- 1 10 Simple Discharge Coordination []  - 0 Complex (extensive) Discharge Coordination PROCESS - Special Needs []  - 0 Pediatric / Minor Patient Management []  - 0 Isolation Patient Management []  - 0 Hearing / Language / Visual special  needs []  - 0 Assessment of Community assistance (transportation, D/C planning, etc.) []  - 0 Additional assistance / Altered mentation []  - 0 Support Surface(s) Assessment (bed, cushion, seat, etc.) INTERVENTIONS - Wound Cleansing /  Measurement []  - 0 Simple Wound Cleansing - one wound X- 3 5 Complex Wound Cleansing - multiple wounds X- 1 5 Wound Imaging (photographs - any number of wounds) []  - 0 Wound Tracing (instead of photographs) []  - 0 Simple Wound Measurement - one wound X- 3 5 Complex Wound Measurement - multiple wounds INTERVENTIONS - Wound Dressings X - Small Wound Dressing one or multiple wounds 3 10 []  - 0 Medium Wound Dressing one or multiple wounds []  - 0 Large Wound Dressing one or multiple wounds []  - 0 Application of Medications - topical []  - 0 Application of Medications - injection INTERVENTIONS - Miscellaneous []  - 0 External ear exam []  - 0 Specimen Collection (cultures, biopsies, blood, body fluids, etc.) []  - 0 Specimen(s) / Culture(s) sent or taken to Lab for analysis []  - 0 Patient Transfer (multiple staff / Nurse, adult / Similar devices) []  - 0 Simple Staple / Suture removal (25 or less) []  - 0 Complex Staple / Suture removal (26 or more) []  - 0 Hypo / Hyperglycemic Management (close monitor of Blood Glucose) []  - 0 Ankle / Brachial Index (ABI) - do not check if billed separately X- 1 5 Vital Signs Has the patient been seen at the hospital within the last three years: Yes Total Score: 125 Level Of Care: New/Established - Level 4 Carol, Kent ([id number]) [id number] Page 3 of 11 Electronic Signature(s) Unsigned Entered By: Carol Kent on 07/28/2022 13:29:11 -------------------------------------------------------------------------------- Encounter Discharge Information Details Patient Name: Date of Service: Carol Kent, Carol Kent 07/28/2022 12:45 PM Medical Record Number: [id number] Patient Account  Number: [id number] Date of Birth/Sex: Treating RN: [birth year] (82 y.o. Carol Kent Primary Care Denaja Verhoeven: Carol Kent Other Clinician: Betha Kent Referring Inanna Telford: Treating Shannon Kirkendall/Extender: Carol Kent in Treatment: 3 Encounter Discharge Information Items Discharge Condition: Stable Ambulatory Status: Ambulatory Discharge Destination: Home Transportation: Private Auto Accompanied By: daughter Schedule Follow-up Appointment: Yes Clinical Summary of Care: Electronic Signature(s) Unsigned Entered By: Carol Kent on 07/28/2022 16:16:46 -------------------------------------------------------------------------------- Lower Extremity Assessment Details Patient Name: Date of Service: Carol Kent, Carol Kent 07/28/2022 12:45 PM Medical Record Number: [id number] Patient Account Number: [id number] Date of Birth/Sex: Treating RN: 17-May-1940 (82 y.o. Carol Kent Primary Care Keian Odriscoll: Carol Kent Other Clinician: Betha Kent Referring Madesyn Ast: Treating Dreyton Roessner/Extender: Carol Kent in Treatment: 3 Edema Assessment Assessed: [Left: No] [Right: Yes] Edema: [Left: Ye] [Right: s] Calf Left: Right: Point of Measurement: 33 cm From Medial Instep 33.4 cm Ankle Left: Right: Point of Measurement: 10 cm From Medial Instep 26 cm Vascular Assessment Pulses: Dorsalis Pedis Palpable: [Right:Yes] Electronic Signature(s) Signed: 07/28/2022 4:09:01 PM By: Angelina Pih Entered By: Carol Kent on 07/28/2022 13:13:22 Carol Kent ([id number]) [id number] Page 4 of 11 -------------------------------------------------------------------------------- Multi Wound Chart Details Patient Name: Date of Service: Carol Kent, Carol Kent 07/28/2022 12:45 PM Medical Record Number: [id number] Patient Account Number: [id number] Date of Birth/Sex: Treating RN: 1940/11/01 (82 y.o. Carol Kent Primary Care  Macee Venables: Carol Kent Other Clinician: Betha Kent Referring Amous Crewe: Treating Dequon Schnebly/Extender: Carol Kent in Treatment: 3 Vital Signs Height(in): 62 Pulse(bpm): 94 Weight(lbs): 150 Blood Pressure(mmHg): 131/61 Body Mass Index(BMI): 27.4 Temperature(F): 97.9 Respiratory Rate(breaths/min): 18 [1:Photos:] Right Lower Leg Right, Proximal, Lateral Lower Leg Right, Distal, Medial Lower Leg Wound Location: Skin Tear/Laceration Skin Tear/Laceration Skin Tear/Laceration Wounding Event: Skin Tear Skin Tear Skin Tear Primary Etiology: Cataracts, Anemia, Congestive Heart Cataracts, Anemia, Congestive Heart Cataracts, Anemia, Congestive Heart Comorbid History: Failure, Rheumatoid Arthritis Failure,  Rheumatoid Arthritis Failure, Rheumatoid Arthritis 06/19/2022 07/04/2022 07/14/2022 Date Acquired: 3 3 2  Weeks of Treatment: Open Open Open Wound Status: No No No Wound Recurrence: 10x6.4x0.1 0.1x0.1x0.1 1.5x1.2x0.1 Measurements L x W x D (cm) 50.265 0.008 1.414 A (cm) : rea 5.027 0.001 0.141 Volume (cm) : 40.70% 99.00% 33.80% % Reduction in Area: 40.70% 98.70% 67.00% % Reduction in Volume: Full Thickness Without Exposed Full Thickness Without Exposed Full Thickness Without Exposed Classification: Support Structures Support Structures Support Structures Medium Medium Medium Exudate A mount: Serosanguineous Serosanguineous Serosanguineous Exudate Type: red, brown red, brown red, brown Exudate Color: Medium (34-66%) None Present (0%) Large (67-100%) Granulation A mount: Red N/A Red, Pink Granulation Quality: Medium (34-66%) Large (67-100%) Small (1-33%) Necrotic A mount: Fat Layer (Subcutaneous Tissue): Yes Fat Layer (Subcutaneous Tissue): No Fat Layer (Subcutaneous Tissue): Yes Exposed Structures: Small (1-33%) None None Epithelialization: Treatment Notes Electronic Signature(s) Unsigned Entered ByBetha Kent on 07/28/2022  13:13:28 -------------------------------------------------------------------------------- Multi-Disciplinary Care Plan Details Patient Name: Date of Service: Carol Kent, Carol Kent 07/28/2022 12:45 PM Medical Record Number: [id number] Patient Account Number: [id number] Date of Birth/Sex: Treating RN: 02/26/1941 (82 y.o. Carol Kent Primary Care Iona Stay: Carol Kent Other Clinician: Betha Kent Referring Remijio Holleran: Treating Dayson Aboud/Extender: Carol Kent in Treatment: [id number] [place], [place] ([id number]) [id number] Page 5 of 11 Abuse / Safety / Falls / Self Care Management Nursing Diagnoses: History of Falls Impaired physical mobility Potential for falls Potential for injury related to falls Goals: Patient will not develop complications from immobility Date Initiated: 07/07/2022 Target Resolution Date: 08/04/2022 Goal Status: Active Patient will not experience any injury related to falls Date Initiated: 07/07/2022 Target Resolution Date: 08/04/2022 Goal Status: Active Patient will remain injury free related to falls Date Initiated: 07/07/2022 Target Resolution Date: 08/18/2022 Goal Status: Active Patient/caregiver will demonstrate safe use of adaptive devices to increase mobility Date Initiated: 07/07/2022 Target Resolution Date: 08/04/2022 Goal Status: Active Patient/caregiver will verbalize understanding of skin care regimen Date Initiated: 07/07/2022 Date Inactivated: 07/07/2022 Target Resolution Date: 07/07/2022 Goal Status: Met Patient/caregiver will verbalize/demonstrate measures taken to improve the patient's personal safety Date Initiated: 07/07/2022 Target Resolution Date: 08/11/2022 Goal Status: Active Patient/caregiver will verbalize/demonstrate measures taken to prevent injury and/or falls Date Initiated: 07/07/2022 Target Resolution Date: 08/11/2022 Goal Status: Active Interventions: Assess Activities of Daily  Living upon admission and as needed Assess fall risk on admission and as needed Assess impairment of mobility on admission and as needed per policy Assess personal safety and home safety (as indicated) on admission and as needed Provide education on fall prevention Provide education on personal and home safety Provide education on safe transfers Notes: Necrotic Tissue Nursing Diagnoses: Impaired tissue integrity related to necrotic/devitalized tissue Knowledge deficit related to management of necrotic/devitalized tissue Goals: Necrotic/devitalized tissue will be minimized in the wound bed Date Initiated: 07/07/2022 Target Resolution Date: 08/04/2022 Goal Status: Active Patient/caregiver will verbalize understanding of reason and process for debridement of necrotic tissue Date Initiated: 07/07/2022 Date Inactivated: 07/07/2022 Target Resolution Date: 07/07/2022 Goal Status: Met Interventions: Assess patient pain level pre-, during and post procedure and prior to discharge Provide education on necrotic tissue and debridement process Treatment Activities: Biologic debridement : 07/07/2022 Notes: Wound/Skin Impairment Nursing Diagnoses: Impaired tissue integrity Knowledge deficit related to ulceration/compromised skin integrity Goals: Ulcer/skin breakdown will have a volume reduction of 30% by week 4 Date Initiated: 07/07/2022 Target Resolution Date: 08/04/2022 Goal Status: Active BRICELYN, FREESTONE ([id number]) [phone].pdf Page 6 of 11 Ulcer/skin breakdown will have a  volume reduction of 50% by week 8 Date Initiated: 07/07/2022 Target Resolution Date: 09/01/2022 Goal Status: Active Ulcer/skin breakdown will have a volume reduction of 80% by week 12 Date Initiated: 07/07/2022 Target Resolution Date: 09/29/2022 Goal Status: Active Ulcer/skin breakdown will heal within 14 weeks Date Initiated: 07/07/2022 Target Resolution Date: 10/13/2022 Goal Status:  Active Interventions: Assess patient/caregiver ability to obtain necessary supplies Assess patient/caregiver ability to perform ulcer/skin care regimen upon admission and as needed Assess ulceration(s) every visit Provide education on ulcer and skin care Treatment Activities: Skin care regimen initiated : 07/07/2022 Notes: Electronic Signature(s) Signed: 07/28/2022 4:09:01 PM By: Angelina Pih Entered By: Carol Kent on 07/28/2022 13:29:24 -------------------------------------------------------------------------------- Pain Assessment Details Patient Name: Date of Service: Carol Kent 07/28/2022 12:45 PM Medical Record Number: [id number] Patient Account Number: [id number] Date of Birth/Sex: Treating RN: 1940/08/20 (82 y.o. Carol Kent Primary Care Hobson Lax: Carol Kent Other Clinician: Betha Kent Referring Darenda Fike: Treating Jaevian Shean/Extender: Carol Kent in Treatment: 3 Active Problems Location of Pain Severity and Description of Pain Patient Has Paino No Site Locations Pain Management and Medication Current Pain Management: Electronic Signature(s) Signed: 07/28/2022 4:09:01 PM By: Angelina Pih Entered By: Carol Kent on 07/28/2022 12:56:52 -------------------------------------------------------------------------------- Patient/Caregiver Education Details Patient Name: Date of Service: Carol Kent 4/29/2024andnbsp12:45 PM Carol Kent ([id number]) [id number] Page 7 of 11 Medical Record Number: [id number] Patient Account Number: [id number] Date of Birth/Gender: Treating RN: [birth year] (82 y.o. Carol Kent Primary Care Physician: Carol Kent Other Clinician: Betha Kent Referring Physician: Treating Physician/Extender: Carol Kent in Treatment: 3 Education Assessment Education Provided To: Patient Education Topics Provided Wound/Skin  Impairment: Handouts: Other: continue wound care as directed Methods: Explain/Verbal Responses: State content correctly Electronic Signature(s) Unsigned Entered By: Carol Kent on 07/28/2022 13:30:04 -------------------------------------------------------------------------------- Wound Assessment Details Patient Name: Date of Service: Carol Kent, Carol Kent 07/28/2022 12:45 PM Medical Record Number: [id number] Patient Account Number: [id number] Date of Birth/Sex: Treating RN: June 14, 1940 (82 y.o. Carol Kent Primary Care Mihira Tozzi: Carol Kent Other Clinician: Betha Kent Referring Chi Garlow: Treating Illeana Edick/Extender: Carol Kent in Treatment: 3 Wound Status Wound Number: 1 Primary Skin Tear Etiology: Wound Location: Right Lower Leg Wound Status: Open Wounding Event: Skin Tear/Laceration Comorbid Cataracts, Anemia, Congestive Heart Failure, Rheumatoid Date Acquired: 06/19/2022 History: Arthritis Weeks Of Treatment: 3 Clustered Wound: No Photos Wound Measurements Length: (cm) 10 Width: (cm) 6.4 Depth: (cm) 0.1 Area: (cm) 50.265 Volume: (cm) 5.027 % Reduction in Area: 40.7% % Reduction in Volume: 40.7% Epithelialization: Small (1-33%) Wound Description Classification: Full Thickness Without Exposed Support Exudate Amount: Medium Exudate Type: Serosanguineous Exudate Color: red, brown Structures Foul Odor After Cleansing: No Slough/Fibrino Yes Wound Bed NORINNE, JEANE ([id number]) [phone].pdf Page 8 of 11 Granulation Amount: Medium (34-66%) Exposed Structure Granulation Quality: Red Fat Layer (Subcutaneous Tissue) Exposed: Yes Necrotic Amount: Medium (34-66%) Treatment Notes Wound #1 (Lower Leg) Wound Laterality: Right Cleanser Soap and Water Discharge Instruction: Gently cleanse wound with antibacterial soap, rinse and pat dry prior to dressing wounds Peri-Wound Care Topical Primary  Dressing Xeroform 5x9-HBD (in/in) Discharge Instruction: Apply Xeroform 5x9-HBD (in/in) as directed Double layer Secondary Dressing ABD Pad 5x9 (in/in) Discharge Instruction: Cover with ABD pad Secured With Medipore T - 39M Medipore H Soft Cloth Surgical T ape ape, 2x2 (in/yd) Conform 4'' - Conforming Stretch Gauze Bandage 4x75 (in/in) Discharge Instruction: Apply as directed Tubigrip Size F, 4x10 (in/yd) Discharge Instruction: Apply Tubigrip F 3 finger-widths below knee to base of toes to  secure dressing and/or for swelling. Single layer Compression Wrap Compression Stockings Add-Ons Electronic Signature(s) Signed: 07/28/2022 4:09:01 PM By: Angelina Pih Entered By: Carol Kent on 07/28/2022 13:12:08 -------------------------------------------------------------------------------- Wound Assessment Details Patient Name: Date of Service: Carol Kent, Carol Kent 07/28/2022 12:45 PM Medical Record Number: [id number] Patient Account Number: [id number] Date of Birth/Sex: Treating RN: 11-Dec-1940 (82 y.o. Carol Kent Primary Care Pieter Fooks: Carol Kent Other Clinician: Betha Kent Referring Lillien Petronio: Treating Skyler Carel/Extender: Carol Kent in Treatment: 3 Wound Status Wound Number: 2 Primary Skin Tear Etiology: Wound Location: Right, Proximal, Lateral Lower Leg Wound Status: Healed - Epithelialized Wounding Event: Skin Tear/Laceration Comorbid Cataracts, Anemia, Congestive Heart Failure, Rheumatoid Date Acquired: 07/04/2022 History: Arthritis Weeks Of Treatment: 3 Clustered Wound: No Photos Carol Kent, Carol Kent ([id number]) [id number] Page 9 of 11 Wound Measurements Length: (cm) Width: (cm) Depth: (cm) Area: (cm) Volume: (cm) 0 % Reduction in Area: 100% 0 % Reduction in Volume: 100% 0 Epithelialization: Large (67-100%) 0 0 Wound Description Classification: Full Thickness Without Exposed Suppor Exudate Amount: None  Present t Structures Foul Odor After Cleansing: No Slough/Fibrino No Wound Bed Granulation Amount: None Present (0%) Exposed Structure Necrotic Amount: Large (67-100%) Fat Layer (Subcutaneous Tissue) Exposed: No Treatment Notes Wound #2 (Lower Leg) Wound Laterality: Right, Lateral, Proximal Cleanser Peri-Wound Care Topical Primary Dressing Secondary Dressing Secured With Compression Wrap Compression Stockings Add-Ons Electronic Signature(s) Signed: 07/28/2022 4:09:01 PM By: Angelina Pih Entered By: Carol Kent on 07/28/2022 13:27:53 -------------------------------------------------------------------------------- Wound Assessment Details Patient Name: Date of Service: Carol Kent 07/28/2022 12:45 PM Medical Record Number: [id number] Patient Account Number: [id number] Date of Birth/Sex: Treating RN: 03/15/1941 (82 y.o. Carol Kent Primary Care Jamelle Goldston: Carol Kent Other Clinician: Betha Kent Referring Loc Feinstein: Treating Katryn Plummer/Extender: Carol Kent in Treatment: 3 Wound Status Wound Number: 3 Primary Skin Tear Etiology: Wound Location: Right, Distal, Medial Lower Leg Wound Status: Open Wounding Event: Skin Tear/Laceration Comorbid Cataracts, Anemia, Congestive Heart Failure, Rheumatoid Date Acquired: 07/14/2022 History: Arthritis Weeks Of Treatment: 2 Clustered Wound: No Photos Carol Kent, Carol Kent ([id number]) [id number] Page 10 of 11 Wound Measurements Length: (cm) 1.5 Width: (cm) 1.2 Depth: (cm) 0.1 Area: (cm) 1.414 Volume: (cm) 0.141 % Reduction in Area: 33.8% % Reduction in Volume: 67% Epithelialization: None Wound Description Classification: Full Thickness Without Exposed Support Structures Exudate Amount: Medium Exudate Type: Serosanguineous Exudate Color: red, brown Foul Odor After Cleansing: No Slough/Fibrino Yes Wound Bed Granulation Amount: Large (67-100%) Exposed  Structure Granulation Quality: Red, Pink Fat Layer (Subcutaneous Tissue) Exposed: Yes Necrotic Amount: Small (1-33%) Necrotic Quality: Adherent Slough Treatment Notes Wound #3 (Lower Leg) Wound Laterality: Right, Medial, Distal Cleanser Soap and Water Discharge Instruction: Gently cleanse wound with antibacterial soap, rinse and pat dry prior to dressing wounds Peri-Wound Care Topical Primary Dressing Xeroform-HBD 2x2 (in/in) Discharge Instruction: Apply Xeroform-HBD 2x2 (in/in) as directed Double layer Secondary Dressing ABD Pad 5x9 (in/in) Discharge Instruction: Cover with ABD pad Secured With Medipore T - 69M Medipore H Soft Cloth Surgical T ape ape, 2x2 (in/yd) Conform 4'' - Conforming Stretch Gauze Bandage 4x75 (in/in) Discharge Instruction: Apply as directed Tubigrip Size F, 4x10 (in/yd) Discharge Instruction: Apply Tubigrip F 3 finger-widths below knee to base of toes to secure dressing and/or for swelling. Single layer Compression Wrap Compression Stockings Add-Ons Electronic Signature(s) Signed: 07/28/2022 4:09:01 PM By: Angelina Pih Entered By: Carol Kent on 07/28/2022 13:09:18 -------------------------------------------------------------------------------- Vitals Details Patient Name: Date of Service: Carol Kent 07/28/2022 12:45 PM Medical Record Number: [id number] Patient Account  Number: [id number] Date of Birth/Sex: Treating RN: [birth year] (82 y.o. Carol Kent Primary Care Chyler Creely: Carol Kent Other Clinician: Betha Kent Referring Axle Parfait: Treating Tersa Fotopoulos/Extender: Carol Kent in Treatment: 3 Vital Signs Time Taken: 12:54 Temperature (F): 97.9 Height (in): 62 Pulse (bpm): 94 Weight (lbs): 150 Respiratory Rate (breaths/min): 18 Body Mass Index (BMI): 27.4 Blood Pressure (mmHg): 131/61 Carol Kent, Carol Kent ([id number]) [phone].pdf Page 11 of 11 Reference Range: 80 - 120 mg /  dl Electronic Signature(s) Unsigned Entered By: Carol Kent on 07/28/2022 12:56:44 Signature(s): Date(s):

## 2022-07-29 NOTE — Progress Notes (Signed)
YAN, OKRAY ([id number]) [id number] Page 1 of 6 Visit Report for 07/28/2022 Chief Complaint Document Details Patient Name: Date of Service: Carol Kent, Carol Kent 07/28/2022 12:45 PM Medical Record Number: [id number] Patient Account Number: [id number] Date of Birth/Sex: Treating RN: 04-18-1940 (82 y.o. Carol Kent Primary Care Provider: [name] Other Clinician: Betha Loa Referring Provider: Treating Provider/Extender: Florian Buff in Treatment: 3 Information Obtained from: Patient Chief Complaint Right LE Ulcers/Skin Tears Electronic Signature(s) Signed: 07/28/2022 12:53:03 PM By: Allen Derry PA-Kent Entered By: Allen Derry on 07/28/2022 12:53:02 -------------------------------------------------------------------------------- HPI Details Patient Name: Date of Service: Carol Kent 07/28/2022 12:45 PM Medical Record Number: [id number] Patient Account Number: [id number] Date of Birth/Sex: Treating RN: 17-Mar-1941 (82 y.o. Carol Kent Primary Care Provider: [name] Other Clinician: Betha Loa Referring Provider: Treating Provider/Extender: Florian Buff in Treatment: 3 History of Present Illness HPI Description: 07-07-2022 upon evaluation today patient appears to be doing somewhat poorly in regard to wounds that actually began in the right leg as a result of trauma. She tells me that she has been having some discomfort it is not hurting too bad however. She was originally placed on Keflex but she is finished this. She did not go to the ER to have this checked out for 3 days after it initially happened so they were not able to Steri-Strip anything back down. She has been using bacitracin and Xeroform. She does have family members that have been helping her with dressing changes. With that being said she does not appear to have any signs of infection right now but nonetheless does have  obviously the wounds that need to be addressed. Patient does have a history of chronic kidney disease stage IV, congestive heart failure, lower extremity venous insufficiency and swelling although this is not too significant, and peripheral vascular disease as ABIs are noncompressible. She does seem to have good blood flow however. Her capillary refill was good and the wounds themselves do seem to be bleeding even with light cleaning. 07-14-2022 upon evaluation today patient appears to be doing well currently in regard to her wounds. She is actually showing signs of improvement which is good news. Fortunately there does not appear to be any signs of active infection locally nor systemically which is great news. No fevers, chills, nausea, vomiting, or diarrhea. 07-28-2022 upon evaluation today patient appears to be doing well currently in regard to her wounds which are actually measuring smaller this is great news. She does have a couple of blisters around the knee from where she bumped it but were not really sure exactly what she bumped it on. Nonetheless I am going to likely help to manage that as well now and see if we can prevent this from opening and turning into a wound. Electronic Signature(s) Signed: 07/28/2022 6:18:18 PM By: Allen Derry PA-Kent Entered By: Allen Derry on 07/28/2022 18:18:18 -------------------------------------------------------------------------------- Physical Exam Details Patient Name: Date of Service: Carol Kent, Carol Kent 07/28/2022 12:45 PM Medical Record Number: [id number] Patient Account Number: [id number] Date of Birth/Sex: Treating RN: 1940-05-23 (82 y.o. Carol Kent Primary Care Provider: [name] Other Clinician: Betha Loa Referring Provider: Treating Provider/Extender: Florian Buff in Treatment: 3 Constitutional Well-nourished and well-hydrated in no acute distress. Carol Kent, Carol Kent ([id number])  [id number] Page 2 of 6 Respiratory normal breathing without difficulty. Psychiatric this patient is able to make decisions and demonstrates good insight into disease process. Alert and Oriented x 3. pleasant and  cooperative. Notes Upon inspection patient's wound bed actually showed signs of good granulation epithelization at this point. Fortunately there does not appear to be any signs of active infection locally nor systemically which is great news and overall I am extremely pleased with where we stand at this point. Electronic Signature(s) Signed: 07/28/2022 6:18:35 PM By: Allen Derry PA-Kent Entered By: Allen Derry on 07/28/2022 18:18:35 -------------------------------------------------------------------------------- Physician Orders Details Patient Name: Date of Service: Carol Kent 07/28/2022 12:45 PM Medical Record Number: [id number] Patient Account Number: [id number] Date of Birth/Sex: Treating RN: [birth year] (82 y.o. Carol Kent Primary Care Provider: [name] Other Clinician: Betha Loa Referring Provider: Treating Provider/Extender: Florian Buff in Treatment: 3 Verbal / Phone Orders: Yes Clinician: Angelina Pih Read Back and Verified: Yes Diagnosis Coding ICD-10 Coding Code Description [phone] Laceration without foreign body, right lower leg, initial encounter L97.812 Non-pressure chronic ulcer of other part of right lower leg with fat layer exposed N18.4 Chronic kidney disease, stage 4 (severe) I50.42 Chronic combined systolic (congestive) and diastolic (congestive) heart failure 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 right lower extremity I73.89 Other specified peripheral vascular diseases Follow-up Appointments Return Appointment in 1 week. Home Health Home Health Company: - [facility] ADMIT to Home Health for wound care. May utilize formulary equivalent  dressing for wound treatment orders unless otherwise specified. Home Health Nurse may visit PRN to address patients wound care needs. CONTINUE Home Health for wound care. May utilize formulary equivalent dressing for wound treatment orders unless otherwise specified. Home Health Nurse may visit PRN to address patients wound care needs. Scheduled days for dressing changes to be completed; exception, patient has scheduled wound care visit that day. **Please direct any NON-WOUND related issues/requests for orders to patient's Primary Care Physician. **If current dressing causes regression in wound condition, may D/Kent ordered dressing product/s and apply Normal Saline Moist Dressing daily until next Wound Healing Center or Other MD appointment. **Notify Wound Healing Center of regression in wound condition at [phone]. Other Home Health Orders/Instructions: - for tubi grip please place on leg just above toes and about two fingers width below knee, for swelling in lower leg Bathing/ Shower/ Hygiene May shower; gently cleanse wound with antibacterial soap, rinse and pat dry prior to dressing wounds - using dial soap No tub bath. Anesthetic (Use 'Patient Medications' Section for Anesthetic Order Entry) Lidocaine applied to wound bed Edema Control - Lymphedema / Segmental Compressive Device / Other Tubigrip single layer applied. - size F Wound Treatment Wound #1 - Lower Leg Wound Laterality: Right Cleanser: Soap and Water Every Other Day/30 Days Discharge Instructions: Gently cleanse wound with antibacterial soap, rinse and pat dry prior to dressing wounds Prim Dressing: Xeroform 5x9-HBD (in/in) Every Other Day/30 Days ary Discharge Instructions: Apply Xeroform 5x9-HBD (in/in) as directed Double layer Secondary Dressing: ABD Pad 5x9 (in/in) Every Other Day/30 Days Discharge Instructions: Cover with ABD pad Carol Kent, Carol Kent ([id number]) [id number] Page 3 of 6 Secured  With: Medipore T - 75M Medipore H Soft Cloth Surgical T ape ape, 2x2 (in/yd) Every Other Day/30 Days Secured With: Conform 4'' - Conforming Stretch Gauze Bandage 4x75 (in/in) Every Other Day/30 Days Discharge Instructions: Apply as directed Secured With: Tubigrip Size F, 4x10 (in/yd) Every Other Day/30 Days Discharge Instructions: Apply Tubigrip F 3 finger-widths below knee to base of toes to secure dressing and/or for swelling. Single layer Wound #3 - Lower Leg Wound Laterality: Right, Medial, Distal Cleanser: Soap and Water Every Other  Day/30 Days Discharge Instructions: Gently cleanse wound with antibacterial soap, rinse and pat dry prior to dressing wounds Prim Dressing: Xeroform-HBD 2x2 (in/in) Every Other Day/30 Days ary Discharge Instructions: Apply Xeroform-HBD 2x2 (in/in) as directed Double layer Secondary Dressing: ABD Pad 5x9 (in/in) Every Other Day/30 Days Discharge Instructions: Cover with ABD pad Secured With: Medipore T - 75M Medipore H Soft Cloth Surgical T ape ape, 2x2 (in/yd) Every Other Day/30 Days Secured With: Conform 4'' - Conforming Stretch Gauze Bandage 4x75 (in/in) Every Other Day/30 Days Discharge Instructions: Apply as directed Secured With: Tubigrip Size F, 4x10 (in/yd) Every Other Day/30 Days Discharge Instructions: Apply Tubigrip F 3 finger-widths below knee to base of toes to secure dressing and/or for swelling. Single layer Patient Medications llergies: codeine, erythromycin base, fentanyl, morphine, Orencia, propoxyphene, Sulfa (Sulfonamide Antibiotics), penicillin A Notifications Medication Indication Start End 07/28/2022 doxycycline hyclate DOSE 1 - oral 100 mg capsule - 1 capsule oral twice a day x 7 days Electronic Signature(s) Signed: 07/28/2022 1:42:54 PM By: Allen Derry PA-Kent Entered By: Allen Derry on 07/28/2022 13:42:53 -------------------------------------------------------------------------------- Problem List Details Patient Name: Date of  Service: Carol Kent 07/28/2022 12:45 PM Medical Record Number: [id number] Patient Account Number: [id number] Date of Birth/Sex: Treating RN: June 23, 1940 (82 y.o. 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 of other part of right lower leg with fat layer 07/07/2022 No Yes exposed N18.4 Chronic kidney disease, stage 4 (severe) 07/07/2022 No Yes I50.42 Chronic combined systolic (congestive) and diastolic (congestive) heart failure 07/07/2022 No Yes CATHELINE, HIXON ([id number]) [id number] Page 4 of 6 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 right 07/07/2022 No Yes lower extremity I73.89 Other specified peripheral vascular diseases 07/07/2022 No Yes Inactive Problems Resolved Problems Electronic Signature(s) Signed: 07/28/2022 12:52:53 PM By: Allen Derry PA-Kent Entered By: Allen Derry on 07/28/2022 12:52:53 -------------------------------------------------------------------------------- Progress Note Details Patient Name: Date of Service: Carol Kent 07/28/2022 12:45 PM Medical Record Number: [id number] Patient Account Number: [id number] Date of Birth/Sex: Treating RN: 22-Apr-1940 (82 y.o. Carol Kent Primary Care Provider: [name] Other Clinician: Betha Loa Referring Provider: Treating Provider/Extender: Florian Buff in Treatment: 3 Subjective Chief Complaint Information obtained from Patient Right LE Ulcers/Skin Tears History of Present Illness (HPI) 07-07-2022 upon evaluation today patient appears to be doing somewhat poorly in  regard to wounds that actually began in the right leg as a result of trauma. She tells me that she has been having some discomfort it is not hurting too bad however. She was originally placed on Keflex but she is finished this. She did not go to the ER to have this checked out for 3 days after it initially happened so they were not able to Steri-Strip anything back down. She has been using bacitracin and Xeroform. She does have family members that have been helping her with dressing changes. With that being said she does not appear to have any signs of infection right now but nonetheless does have obviously the wounds that need to be addressed. Patient does have a history of chronic kidney disease stage IV, congestive heart failure, lower extremity venous insufficiency and swelling although this is not too significant, and peripheral vascular disease as ABIs are noncompressible. She does  seem to have good blood flow however. Her capillary refill was good and the wounds themselves do seem to be bleeding even with light cleaning. 07-14-2022 upon evaluation today patient appears to be doing well currently in regard to her wounds. She is actually showing signs of improvement which is good news. Fortunately there does not appear to be any signs of active infection locally nor systemically which is great news. No fevers, chills, nausea, vomiting, or diarrhea. 07-28-2022 upon evaluation today patient appears to be doing well currently in regard to her wounds which are actually measuring smaller this is great news. She does have a couple of blisters around the knee from where she bumped it but were not really sure exactly what she bumped it on. Nonetheless I am going to likely help to manage that as well now and see if we can prevent this from opening and turning into a wound. Objective Constitutional Well-nourished and well-hydrated in no acute distress. Vitals Time Taken: 12:54 PM, Height: 62 in, Weight:  150 lbs, BMI: 27.4, Temperature: 97.9 F, Pulse: 94 bpm, Respiratory Rate: 18 breaths/min, Blood Pressure: 131/61 mmHg. Respiratory normal breathing without difficulty. Psychiatric this patient is able to make decisions and demonstrates good insight into disease process. Alert and Oriented x 3. pleasant and cooperative. General Notes: Upon inspection patient's wound bed actually showed signs of good granulation epithelization at this point. Fortunately there does not appear to be any signs of active infection locally nor systemically which is great news and overall I am extremely pleased with where we stand at this point. Carol Kent, Carol Kent ([id number]) [id number] Page 5 of 6 Integumentary (Hair, Skin) Wound #1 status is Open. Original cause of wound was Skin Tear/Laceration. The date acquired was: 06/19/2022. The wound has been in treatment 3 weeks. The wound is located on the Right Lower Leg. The wound measures 10cm length x 6.4cm width x 0.1cm depth; 50.265cm^2 area and 5.027cm^3 volume. There is Fat Layer (Subcutaneous Tissue) exposed. There is a medium amount of serosanguineous drainage noted. There is medium (34-66%) red granulation within the wound bed. There is a medium (34-66%) amount of necrotic tissue within the wound bed. Wound #2 status is Healed - Epithelialized. Original cause of wound was Skin Tear/Laceration. The date acquired was: 07/04/2022. The wound has been in treatment 3 weeks. The wound is located on the Right,Proximal,Lateral Lower Leg. The wound measures 0cm length x 0cm width x 0cm depth; 0cm^2 area and 0cm^3 volume. There is a none present amount of drainage noted. There is no granulation within the wound bed. There is a large (67-100%) amount of necrotic tissue within the wound bed. Wound #3 status is Open. Original cause of wound was Skin T ear/Laceration. The date acquired was: 07/14/2022. The wound has been in treatment 2 weeks. The wound is  located on the Right,Distal,Medial Lower Leg. The wound measures 1.5cm length x 1.2cm width x 0.1cm depth; 1.414cm^2 area and 0.141cm^3 volume. There is Fat Layer (Subcutaneous Tissue) exposed. There is a medium amount of serosanguineous drainage noted. There is large (67-100%) red, pink granulation within the wound bed. There is a small (1-33%) amount of necrotic tissue within the wound bed including Adherent Slough. 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 Home Health: Home Health Company: - [facility] ADMIT to Home Health for wound care. May utilize formulary equivalent dressing for wound treatment orders unless otherwise specified. Home Health Nurse may visit PRN to address patientoos wound care needs. CONTINUE Home Health for wound care. May utilize formulary equivalent dressing for wound treatment orders unless otherwise specified. Home Health Nurse may visit PRN to address patientoos wound care needs. Scheduled days for dressing changes to be completed; exception, patient has scheduled wound care visit that day. **Please direct any NON-WOUND related issues/requests for orders to patient's Primary Care Physician. **If current dressing causes regression in wound condition, may D/Kent ordered dressing product/s and apply Normal Saline Moist Dressing daily until next Wound Healing Center or Other MD appointment. **Notify Wound Healing Center of regression in wound condition at [phone]. Other Home Health Orders/Instructions: - for  tubi grip please place on leg just above toes and about two fingers width below knee, for swelling in lower leg Bathing/ Shower/ Hygiene: May shower; gently cleanse wound with antibacterial soap, rinse and pat dry prior to dressing wounds - using dial soap No tub bath. Anesthetic (Use 'Patient Medications' Section for Anesthetic Order Entry): Lidocaine applied to wound bed Edema Control - Lymphedema / Segmental Compressive Device / Other: Tubigrip single layer applied. - size F The following medication(s) was prescribed: doxycycline hyclate oral 100 mg capsule 1 1 capsule oral twice a day x 7 days starting 07/28/2022 WOUND #1: - Lower Leg Wound Laterality: Right Cleanser: Soap and Water Every Other Day/30 Days Discharge Instructions: Gently cleanse wound with antibacterial soap, rinse and pat dry prior to dressing wounds Prim Dressing: Xeroform 5x9-HBD (in/in) Every Other Day/30 Days ary Discharge Instructions: Apply Xeroform 5x9-HBD (in/in) as directed Double layer Secondary Dressing: ABD Pad 5x9 (in/in) Every Other Day/30 Days Discharge Instructions: Cover with ABD pad Secured With: Medipore T - 55M Medipore H Soft Cloth Surgical T ape ape, 2x2 (in/yd) Every Other Day/30 Days Secured With: Conform 4'' - Conforming Stretch Gauze Bandage 4x75 (in/in) Every Other Day/30 Days Discharge Instructions: Apply as directed Secured With: Tubigrip Size F, 4x10 (in/yd) Every Other Day/30 Days Discharge Instructions: Apply Tubigrip F 3 finger-widths below knee to base of toes to secure dressing and/or for swelling. Single layer WOUND #3: - Lower Leg Wound Laterality: Right, Medial, Distal Cleanser: Soap and Water Every Other Day/30 Days Discharge Instructions: Gently cleanse wound with antibacterial soap, rinse and pat dry prior to dressing wounds Prim Dressing: Xeroform-HBD 2x2 (in/in) Every Other Day/30 Days ary Discharge Instructions: Apply Xeroform-HBD 2x2 (in/in) as directed Double  layer Secondary Dressing: ABD Pad 5x9 (in/in) Every Other Day/30 Days Discharge Instructions: Cover with ABD pad Secured With: Medipore T - 55M Medipore H Soft Cloth Surgical T ape ape, 2x2 (in/yd) Every Other Day/30 Days Secured With: Conform 4'' - Conforming Stretch Gauze Bandage 4x75 (in/in) Every Other Day/30 Days Discharge Instructions: Apply as directed Secured With: Tubigrip Size F, 4x10 (in/yd) Every Other Day/30 Days Discharge Instructions: Apply Tubigrip F 3 finger-widths below knee to base of toes to secure dressing and/or for swelling. Single layer 1. I am going to recommend that we have the patient continue with the Xeroform gauze to the open wounds. 2. T the blister areas where she bumped it and it is more of a blood blister on the recommend Betadine and then began to be using the Tubigrip over top after o Carol Kent, Carol Kent (  [id number]) [id number] Page 6 of 6 put an ABD pad over. 3. I am also going to suggest that we should continue to utilize the ABD pad to cover and roll gauze to secure in place around the leg as well. 4. I am going to recommend as well that we have the patient take doxycycline just for 7 days in order to make sure that there is no infection that ensues with a new blood blister she is in agreement with the plan and we will see where things stand next week and follow-up. We will see patient back for reevaluation in 1 week here in the clinic. If anything worsens or changes patient will contact our office for additional recommendations. Electronic Signature(s) Signed: 07/28/2022 6:20:51 PM By: Allen Derry PA-Kent Previous Signature: 07/28/2022 6:19:03 PM Version By: Allen Derry PA-Kent Entered By: Allen Derry on 07/28/2022 18:20:51 -------------------------------------------------------------------------------- SuperBill Details Patient Name: Date of Service: Carol Kent 07/28/2022 Medical Record Number: [id number] Patient Account  Number: [id number] Date of Birth/Sex: Treating RN: Aug 10, 1940 (82 y.o. Carol Kent Primary Care Provider: [name] Other Clinician: Betha Loa Referring Provider: Treating Provider/Extender: Florian Buff in Treatment: 3 Diagnosis Coding ICD-10 Codes Code Description [phone] Laceration without foreign body, right lower leg, initial encounter L97.812 Non-pressure chronic ulcer of other part of right lower leg with fat layer exposed N18.4 Chronic kidney disease, stage 4 (severe) I50.42 Chronic combined systolic (congestive) and diastolic (congestive) heart failure 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 right lower extremity I73.89 Other specified peripheral vascular diseases Facility Procedures : CPT4 Code: [id number] Description: 99214 - WOUND CARE VISIT-LEV 4 EST PT Modifier: Quantity: 1 Physician Procedures : CPT4 Code Description Modifier [id number] - WC PHYS LEVEL 4 - EST PT ICD-10 Diagnosis Description S81.811A Laceration without foreign body, right lower leg, initial encounter L97.812 Non-pressure chronic ulcer of other part of right lower leg with  fat layer exposed N18.4 Chronic kidney disease, stage 4 (severe) I50.42 Chronic combined systolic (congestive) and diastolic (congestive) heart failure Quantity: 1 Electronic Signature(s) Signed: 07/28/2022 6:21:19 PM By: Allen Derry PA-Kent Previous Signature: 07/28/2022 6:19:26 PM Version By: Allen Derry PA-Kent Entered By: Allen Derry on 07/28/2022 18:21:19

## 2022-08-06 ENCOUNTER — Telehealth: Payer: Self-pay | Admitting: Cardiovascular Disease

## 2022-08-06 NOTE — Telephone Encounter (Signed)
Magda Paganini from [facility] called to report pt's is experiencing increased bilateral lower extremity edema from knee down. She denies SOB or significant weight increase, but stated she's noticed an increase in swelling daily. She reported the left leg is close to blistering  Magda Paganini reported pt is taking Lasix 40 mg MWF  Will forward to NP for recommendations.

## 2022-08-06 NOTE — Telephone Encounter (Signed)
Pt c/o swelling: STAT is pt has developed SOB within 24 hours  How much weight have you gained and in what time span?  Patient gained 1 lb in 1 day  If swelling, where is the swelling located?  Lower extremities   Are you currently taking a fluid pill?  Patient takes Lasix M/W/Fr--Audrey (home health) assumes this may need to be adjusted because patient still has LE swelling  Are you currently SOB?   Do you have a log of your daily weights (if so, list)?  158.4 lbs today  Have you gained 3 pounds in a day or 5 pounds in a week?   Have you traveled recently?  No

## 2022-08-07 ENCOUNTER — Encounter: Payer: Medicare Other | Attending: Physician Assistant | Admitting: Physician Assistant

## 2022-08-07 ENCOUNTER — Other Ambulatory Visit: Payer: Self-pay | Admitting: *Deleted

## 2022-08-07 DIAGNOSIS — I87331 Chronic venous hypertension (idiopathic) with ulcer and inflammation of right lower extremity: Secondary | ICD-10-CM | POA: Diagnosis not present

## 2022-08-07 DIAGNOSIS — N184 Chronic kidney disease, stage 4 (severe): Secondary | ICD-10-CM | POA: Diagnosis not present

## 2022-08-07 DIAGNOSIS — L97812 Non-pressure chronic ulcer of other part of right lower leg with fat layer exposed: Secondary | ICD-10-CM | POA: Diagnosis not present

## 2022-08-07 DIAGNOSIS — X58XXXA Exposure to other specified factors, initial encounter: Secondary | ICD-10-CM | POA: Diagnosis not present

## 2022-08-07 DIAGNOSIS — S81811A Laceration without foreign body, right lower leg, initial encounter: Secondary | ICD-10-CM | POA: Insufficient documentation

## 2022-08-07 DIAGNOSIS — I5042 Chronic combined systolic (congestive) and diastolic (congestive) heart failure: Secondary | ICD-10-CM | POA: Diagnosis not present

## 2022-08-07 DIAGNOSIS — I7389 Other specified peripheral vascular diseases: Secondary | ICD-10-CM | POA: Diagnosis not present

## 2022-08-07 MED ORDER — FUROSEMIDE 40 MG PO TABS: ORAL_TABLET | ORAL | 2 refills | Status: DC

## 2022-08-07 NOTE — Telephone Encounter (Signed)
Carol Kent made aware of NP's recommendations and verbalized understanding.    [name], NP  You16 hours ago (5:07 PM)    At her last appointment she was to be referred to nephrology for creatinine over 2 and GFR of 26. We are limited on diuretic therapy options due to her kidney function. She is currently on furosemide MWF 40 mg. Have her take additional dose on Thursday and Sat. Of this week. Elevate them as much as possible, make sure no extra salt or fluid.

## 2022-08-07 NOTE — Progress Notes (Addendum)
Carol, Kent ([id number]) [id number] Page 1 of 6 Visit Report for 08/07/2022 Chief Complaint Document Details Patient Name: Date of Service: Carol, Kent 08/07/2022 7:45 A M Medical Record Number: [id number] Patient Account Number: [id number] Date of Birth/Sex: Treating RN: 1941/01/02 (82 y.o. Carol Kent Primary Care Provider: [name] Other Clinician: Referring Provider: Treating Provider/Extender: Florian Buff in Treatment: 4 Information Obtained from: Patient Chief Complaint Right LE Ulcers/Skin Tears Electronic Signature(s) Signed: 08/07/2022 8:09:41 AM By: Allen Derry PA-C Entered By: Allen Derry on 08/07/2022 08:09:41 -------------------------------------------------------------------------------- HPI Details Patient Name: Date of Service: Carol Kent 08/07/2022 7:45 A M Medical Record Number: [id number] Patient Account Number: [id number] Date of Birth/Sex: Treating RN: [birth year] (82 y.o. Carol Kent Primary Care Provider: [name] Other Clinician: Referring Provider: Treating Provider/Extender: Florian Buff in Treatment: 4 History of Present Illness HPI Description: 07-07-2022 upon evaluation today patient appears to be doing somewhat poorly in regard to wounds that actually began in the right leg as a result of trauma. She tells me that she has been having some discomfort it is not hurting too bad however. She was originally placed on Keflex but she is finished this. She did not go to the ER to have this checked out for 3 days after it initially happened so they were not able to Steri-Strip anything back down. She has been using bacitracin and Xeroform. She does have family members that have been helping her with dressing changes. With that being said she does not appear to have any signs of infection right now but nonetheless does have obviously the wounds that need to be  addressed. Patient does have a history of chronic kidney disease stage IV, congestive heart failure, lower extremity venous insufficiency and swelling although this is not too significant, and peripheral vascular disease as ABIs are noncompressible. She does seem to have good blood flow however. Her capillary refill was good and the wounds themselves do seem to be bleeding even with light cleaning. 07-14-2022 upon evaluation today patient appears to be doing well currently in regard to her wounds. She is actually showing signs of improvement which is good news. Fortunately there does not appear to be any signs of active infection locally nor systemically which is great news. No fevers, chills, nausea, vomiting, or diarrhea. 07-28-2022 upon evaluation today patient appears to be doing well currently in regard to her wounds which are actually measuring smaller this is great news. She does have a couple of blisters around the knee from where she bumped it but were not really sure exactly what she bumped it on. Nonetheless I am going to likely help to manage that as well now and see if we can prevent this from opening and turning into a wound. 08-07-2022 upon evaluation today patient appears to be doing better in regard to her right leg with original wound although she does have 1 that is open just below the original at the knee. This is where she had some of the blood blisters formed. Fortunately there does not appear to be any signs of active infection locally nor systemically. Unfortunately she still has a tremendous amount of swelling she does see cardiology at the end of the month but she runs out of her fluid pills tomorrow. They are going to see if they can get a refill until she can get in to be seen. Electronic Signature(s) Signed: 08/07/2022 8:48:49 AM By: Allen Derry PA-C Entered By: Larina Bras,  Leonard Schwartz on 08/07/2022  08:48:49 -------------------------------------------------------------------------------- Physical Exam Details Patient Name: Date of Service: Kent, [name] 08/07/2022 7:45 A M Medical Record Number: [id number] Patient Account Number: [id number] Date of Birth/Sex: Treating RN: 06-10-1940 (82 y.o. Carol Kent Primary Care Provider: [name] Other Clinician: Referring Provider: Treating Provider/Extender: Claudia Pollock [place], Claretta Fraise ([id number]) [id number] Page 2 of 6 Weeks in Treatment: 4 Constitutional Well-nourished and well-hydrated in no acute distress. Respiratory normal breathing without difficulty. Psychiatric this patient is able to make decisions and demonstrates good insight into disease process. Alert and Oriented x 3. pleasant and cooperative. Notes Upon inspection patient's wound bed actually showed signs of good granulation position at this point. Fortunately I do not see any signs of worsening other than the little area that opened up at the inside that is medial portion of the knee. This is where she had a blood blister last week and this has open in the interim. Electronic Signature(s) Signed: 08/07/2022 8:49:16 AM By: Allen Derry PA-C Entered By: Allen Derry on 08/07/2022 08:49:16 -------------------------------------------------------------------------------- Physician Orders Details Patient Name: Date of Service: Carol Kent 08/07/2022 7:45 A M Medical Record Number: [id number] Patient Account Number: [id number] Date of Birth/Sex: Treating RN: [birth year] (82 y.o. Carol Kent Primary Care Provider: [name] Other Clinician: Referring Provider: Treating Provider/Extender: Florian Buff in Treatment: 4 Verbal / Phone Orders: No Diagnosis Coding ICD-10 Coding Code Description [phone] Laceration without foreign body, right lower leg, initial encounter L97.812 Non-pressure  chronic ulcer of other part of right lower leg with fat layer exposed N18.4 Chronic kidney disease, stage 4 (severe) I50.42 Chronic combined systolic (congestive) and diastolic (congestive) heart failure 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 right lower extremity I73.89 Other specified peripheral vascular diseases Follow-up Appointments Return Appointment in 1 week. Home Health Home Health Company: - [facility] ADMIT to Home Health for wound care. May utilize formulary equivalent dressing for wound treatment orders unless otherwise specified. Home Health Nurse may visit PRN to address patients wound care needs. CONTINUE Home Health for wound care. May utilize formulary equivalent dressing for wound treatment orders unless otherwise specified. Home Health Nurse may visit PRN to address patients wound care needs. Scheduled days for dressing changes to be completed; exception, patient has scheduled wound care visit that day. **Please direct any NON-WOUND related issues/requests for orders to patient's Primary Care Physician. **If current dressing causes regression in wound condition, may D/C ordered dressing product/s and apply Normal Saline Moist Dressing daily until next Wound Healing Center or Other MD appointment. **Notify Wound Healing Center of regression in wound condition at [phone]. Other Home Health Orders/Instructions: - for tubi grip please place on leg just above toes and about two fingers width below knee, for swelling in lower leg Bathing/ Shower/ Hygiene May shower; gently cleanse wound with antibacterial soap, rinse and pat dry prior to dressing wounds - using dial soap No tub bath. Anesthetic (Use 'Patient Medications' Section for Anesthetic Order Entry) Lidocaine applied to wound bed Edema Control - Lymphedema / Segmental Compressive Device / Other Tubigrip single layer applied. - size F Wound Treatment Wound #1 - Lower Leg Wound  Laterality: Right Cleanser: Soap and Water Every Other Day/30 Days Discharge Instructions: Gently cleanse wound with antibacterial soap, rinse and pat dry prior to dressing wounds EMALENE, GAZZO ([id number]) [id number] Page 3 of 6 Prim Dressing: Xeroform 5x9-HBD (in/in) Every Other Day/30 Days ary Discharge Instructions: Apply Xeroform 5x9-HBD (in/in) as  directed Double layer Secondary Dressing: ABD Pad 5x9 (in/in) Every Other Day/30 Days Discharge Instructions: Cover with ABD pad Secured With: Medipore T - 44M Medipore H Soft Cloth Surgical T ape ape, 2x2 (in/yd) Every Other Day/30 Days Secured With: Conform 4'' - Conforming Stretch Gauze Bandage 4x75 (in/in) Every Other Day/30 Days Discharge Instructions: Apply as directed Secured With: Tubigrip Size F, 4x10 (in/yd) Every Other Day/30 Days Discharge Instructions: Apply Tubigrip F 3 finger-widths below knee to base of toes to secure dressing and/or for swelling. Single layer Wound #4 - Lower Leg Wound Laterality: Right, Posterior Cleanser: Soap and Water Every Other Day/30 Days Discharge Instructions: Gently cleanse wound with antibacterial soap, rinse and pat dry prior to dressing wounds Prim Dressing: Xeroform 5x9-HBD (in/in) Every Other Day/30 Days ary Discharge Instructions: Apply Xeroform 5x9-HBD (in/in) as directed Double layer Secondary Dressing: ABD Pad 5x9 (in/in) Every Other Day/30 Days Discharge Instructions: Cover with ABD pad Secured With: Medipore T - 44M Medipore H Soft Cloth Surgical T ape ape, 2x2 (in/yd) Every Other Day/30 Days Secured With: Conform 4'' - Conforming Stretch Gauze Bandage 4x75 (in/in) Every Other Day/30 Days Discharge Instructions: Apply as directed Secured With: Tubigrip Size F, 4x10 (in/yd) Every Other Day/30 Days Discharge Instructions: Apply Tubigrip F 3 finger-widths below knee to base of toes to secure dressing and/or for swelling. Single layer Electronic  Signature(s) Signed: 08/07/2022 3:10:23 PM By: Midge Aver MSN RN CNS WTA Signed: 08/07/2022 4:04:13 PM By: Allen Derry PA-C Entered By: Midge Aver on 08/07/2022 08:46:12 -------------------------------------------------------------------------------- Problem List Details Patient Name: Date of Service: Carol Kent 08/07/2022 7:45 A M Medical Record Number: [id number] Patient Account Number: [id number] Date of Birth/Sex: Treating RN: [birth year] (82 y.o. Carol Kent Primary Care Provider: [name] Other Clinician: Referring Provider: Treating Provider/Extender: Florian Buff in Treatment: 4 Active Problems ICD-10 Encounter Code Description Active Date MDM Diagnosis S81.811A Laceration without foreign body, right lower leg, initial encounter 07/07/2022 No Yes L97.812 Non-pressure chronic ulcer of other part of right lower leg with fat layer 07/07/2022 No Yes exposed N18.4 Chronic kidney disease, stage 4 (severe) 07/07/2022 No Yes I50.42 Chronic combined systolic (congestive) and diastolic (congestive) heart failure 07/07/2022 No Yes 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 right 07/07/2022 No Yes lower extremity ARIENNE, DEUR ([id number]) [id number] Page 4 of 6 I73.89 Other specified peripheral vascular diseases 07/07/2022 No Yes Inactive Problems Resolved Problems Electronic Signature(s) Signed: 08/07/2022 8:09:38 AM By: Allen Derry PA-C Entered By: Allen Derry on 08/07/2022 08:09:38 -------------------------------------------------------------------------------- Progress Note Details Patient Name: Date of Service: Carol Kent 08/07/2022 7:45 A M Medical Record Number: [id number] Patient Account Number: [id number] Date of Birth/Sex: Treating RN: Jun 15, 1940 (82 y.o. Carol Kent Primary Care Provider: [name] Other Clinician: Referring Provider: Treating Provider/Extender:  Florian Buff in Treatment: 4 Subjective Chief Complaint Information obtained from Patient Right LE Ulcers/Skin Tears History of Present Illness (HPI) 07-07-2022 upon evaluation today patient appears to be doing somewhat poorly in regard to wounds that actually began in the right leg as a result of trauma. She tells me that she has been having some discomfort it is not hurting too bad however. She was originally placed on Keflex but she is finished this. She did not go to the ER to have this checked out for 3 days after it initially happened so they were not able to Steri-Strip anything back down. She has been using bacitracin and Xeroform. She does  have family members that have been helping her with dressing changes. With that being said she does not appear to have any signs of infection right now but nonetheless does have obviously the wounds that need to be addressed. Patient does have a history of chronic kidney disease stage IV, congestive heart failure, lower extremity venous insufficiency and swelling although this is not too significant, and peripheral vascular disease as ABIs are noncompressible. She does seem to have good blood flow however. Her capillary refill was good and the wounds themselves do seem to be bleeding even with light cleaning. 07-14-2022 upon evaluation today patient appears to be doing well currently in regard to her wounds. She is actually showing signs of improvement which is good news. Fortunately there does not appear to be any signs of active infection locally nor systemically which is great news. No fevers, chills, nausea, vomiting, or diarrhea. 07-28-2022 upon evaluation today patient appears to be doing well currently in regard to her wounds which are actually measuring smaller this is great news. She does have a couple of blisters around the knee from where she bumped it but were not really sure exactly what she bumped it on. Nonetheless I am  going to likely help to manage that as well now and see if we can prevent this from opening and turning into a wound. 08-07-2022 upon evaluation today patient appears to be doing better in regard to her right leg with original wound although she does have 1 that is open just below the original at the knee. This is where she had some of the blood blisters formed. Fortunately there does not appear to be any signs of active infection locally nor systemically. Unfortunately she still has a tremendous amount of swelling she does see cardiology at the end of the month but she runs out of her fluid pills tomorrow. They are going to see if they can get a refill until she can get in to be seen. Objective Constitutional Well-nourished and well-hydrated in no acute distress. Vitals Time Taken: 8:00 AM, Height: 62 in, Weight: 150 lbs, BMI: 27.4, Temperature: 97.9 F, Pulse: 92 bpm, Respiratory Rate: 16 breaths/min, Blood Pressure: 122/56 mmHg. Respiratory normal breathing without difficulty. Psychiatric this patient is able to make decisions and demonstrates good insight into disease process. Alert and Oriented x 3. pleasant and cooperative. JULIEANNA, AGYEMAN ([id number]) [id number] Page 5 of 6 General Notes: Upon inspection patient's wound bed actually showed signs of good granulation position at this point. Fortunately I do not see any signs of worsening other than the little area that opened up at the inside that is medial portion of the knee. This is where she had a blood blister last week and this has open in the interim. Integumentary (Hair, Skin) Wound #1 status is Open. Original cause of wound was Skin Tear/Laceration. The date acquired was: 06/19/2022. The wound has been in treatment 4 weeks. The wound is located on the Right Lower Leg. The wound measures 6cm length x 5cm width x 0.1cm depth; 23.562cm^2 area and 2.356cm^3 volume. There is Fat Layer (Subcutaneous Tissue)  exposed. There is a medium amount of serosanguineous drainage noted. There is medium (34-66%) red granulation within the wound bed. There is a medium (34-66%) amount of necrotic tissue within the wound bed. Wound #3 status is Healed - Epithelialized. Original cause of wound was Skin T ear/Laceration. The date acquired was: 07/14/2022. The wound has been in treatment 3 weeks. The wound is located on the  Right,Distal,Medial Lower Leg. The wound measures 0cm length x 0cm width x 0cm depth; 0cm^2 area and 0cm^3 volume. There is Fat Layer (Subcutaneous Tissue) exposed. There is a medium amount of serosanguineous drainage noted. There is large (67-100%) red, pink granulation within the wound bed. There is a small (1-33%) amount of necrotic tissue within the wound bed. Wound #4 status is Open. Original cause of wound was Blister. The date acquired was: 07/30/2022. The wound is located on the Right,Posterior Lower Leg. The wound measures 1cm length x 2.3cm width x 0.1cm depth; 1.806cm^2 area and 0.181cm^3 volume. There is no tunneling or undermining noted. There is a medium amount of serosanguineous drainage noted. There is small (1-33%) red, friable granulation within the wound bed. There is no necrotic tissue within the wound bed. 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 Home Health: Home Health Company: - [facility] ADMIT to Home Health for wound care. May utilize formulary equivalent dressing for wound treatment orders unless otherwise specified.  Home Health Nurse may visit PRN to address patientoos wound care needs. CONTINUE Home Health for wound care. May utilize formulary equivalent dressing for wound treatment orders unless otherwise specified. Home Health Nurse may visit PRN to address patientoos wound care needs. Scheduled days for dressing changes to be completed; exception, patient has scheduled wound care visit that day. **Please direct any NON-WOUND related issues/requests for orders to patient's Primary Care Physician. **If current dressing causes regression in wound condition, may D/C ordered dressing product/s and apply Normal Saline Moist Dressing daily until next Wound Healing Center or Other MD appointment. **Notify Wound Healing Center of regression in wound condition at [phone]. Other Home Health Orders/Instructions: - for tubi grip please place on leg just above toes and about two fingers width below knee, for swelling in lower leg Bathing/ Shower/ Hygiene: May shower; gently cleanse wound with antibacterial soap, rinse and pat dry prior to dressing wounds - using dial soap No tub bath. Anesthetic (Use 'Patient Medications' Section for Anesthetic Order Entry): Lidocaine applied to wound bed Edema Control - Lymphedema / Segmental Compressive Device / Other: Tubigrip single layer applied. - size F WOUND #1: - Lower Leg Wound Laterality: Right Cleanser: Soap and Water Every Other Day/30 Days Discharge Instructions: Gently cleanse wound with antibacterial soap, rinse and pat dry prior to dressing wounds Prim Dressing: Xeroform 5x9-HBD (in/in) Every Other Day/30 Days ary Discharge Instructions: Apply Xeroform 5x9-HBD (in/in) as directed Double layer Secondary Dressing: ABD Pad 5x9 (in/in) Every Other Day/30 Days Discharge Instructions: Cover with ABD pad Secured With: Medipore T - 86M Medipore H Soft Cloth Surgical T ape ape, 2x2 (in/yd) Every Other Day/30 Days Secured With: Conform 4'' - Conforming Stretch Gauze  Bandage 4x75 (in/in) Every Other Day/30 Days Discharge Instructions: Apply as directed Secured With: Tubigrip Size F, 4x10 (in/yd) Every Other Day/30 Days Discharge Instructions: Apply Tubigrip F 3 finger-widths below knee to base of toes to secure dressing and/or for swelling. Single layer WOUND #4: - Lower Leg Wound Laterality: Right, Posterior Cleanser: Soap and Water Every Other Day/30 Days Discharge Instructions: Gently cleanse wound with antibacterial soap, rinse and pat dry prior to dressing wounds  Prim Dressing: Xeroform 5x9-HBD (in/in) Every Other Day/30 Days ary Discharge Instructions: Apply Xeroform 5x9-HBD (in/in) as directed Double layer Secondary Dressing: ABD Pad 5x9 (in/in) Every Other Day/30 Days Discharge Instructions: Cover with ABD pad Secured With: Medipore T - 31M Medipore H Soft Cloth Surgical T ape ape, 2x2 (in/yd) Every Other Day/30 Days Secured With: Conform 4'' - Conforming Stretch Gauze Bandage 4x75 (in/in) Every Other Day/30 Days Discharge Instructions: Apply as directed Secured With: Tubigrip Size F, 4x10 (in/yd) Every Other Day/30 Days Discharge Instructions: Apply Tubigrip F 3 finger-widths below knee to base of toes to secure dressing and/or for swelling. Single layer SAKSHI, GOODBAR ([id number]) [id number] Page 6 of 6 1. I would recommend currently that we have the patient continue to monitor for any signs of infection worsening. Based on what I am seeing I do believe that she is doing decently well however. 2. I am also can recommend that we continue with Tubigrip size F which I think has been beneficial for her up to this point. 3. Will use Xeroform over the open areas followed by an ABD pad. We will see patient back for reevaluation in 1 week here in the clinic. If anything worsens or changes patient will contact our office for additional recommendations. Electronic Signature(s) Signed: 08/07/2022 8:49:40 AM By: Allen Derry  PA-C Entered By: Allen Derry on 08/07/2022 08:49:39 -------------------------------------------------------------------------------- SuperBill Details Patient Name: Date of Service: Carol Kent 08/07/2022 Medical Record Number: [id number] Patient Account Number: [id number] Date of Birth/Sex: Treating RN: 16-Feb-1941 (82 y.o. Carol Kent Primary Care Provider: [name] Other Clinician: Referring Provider: Treating Provider/Extender: Florian Buff in Treatment: 4 Diagnosis Coding ICD-10 Codes Code Description [phone] Laceration without foreign body, right lower leg, initial encounter L97.812 Non-pressure chronic ulcer of other part of right lower leg with fat layer exposed N18.4 Chronic kidney disease, stage 4 (severe) I50.42 Chronic combined systolic (congestive) and diastolic (congestive) heart failure 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 right lower extremity I73.89 Other specified peripheral vascular diseases Facility Procedures : CPT4 Code: [id number] Description: 99214 - WOUND CARE VISIT-LEV 4 EST PT Modifier: Quantity: 1 Physician Procedures : CPT4 Code Description Modifier [id number] - WC PHYS LEVEL 3 - EST PT ICD-10 Diagnosis Description S81.811A Laceration without foreign body, right lower leg, initial encounter L97.812 Non-pressure chronic ulcer of other part of right lower leg with  fat layer exposed N18.4 Chronic kidney disease, stage 4 (severe) I50.42 Chronic combined systolic (congestive) and diastolic (congestive) heart failure Quantity: 1 Electronic Signature(s) Signed: 08/07/2022 8:49:55 AM By: Allen Derry PA-C Entered By: Allen Derry on 08/07/2022 08:49:55

## 2022-08-07 NOTE — Progress Notes (Signed)
Carol Kent ([id number]) [id number] Page 1 of 10 Visit Report for 08/07/2022 Arrival Information Details Patient Name: Date of Service: Carol Kent, Carol Kent 08/07/2022 7:45 A M Medical Record Number: [id number] Patient Account Number: [id number] Date of Birth/Sex: Treating RN: [birth year] (82 y.o. Carol Kent Primary Care Arriel Victor: [name] Other Clinician: Referring Dorathea Faerber: Treating Gentle Hoge/Extender: Florian Buff in Treatment: 4 Visit Information History Since Last Visit Added or deleted any medications: No Patient Arrived: Ambulatory Has Dressing in Place as Prescribed: Yes Arrival Time: 07:57 Pain Present Now: No Accompanied By: daughter Transfer Assistance: None Patient Identification Verified: Yes Secondary Verification Process Completed: Yes Patient Has Alerts: Yes Patient Alerts: ABI non compressible Electronic Signature(s) Signed: 08/07/2022 3:10:23 PM By: Midge Aver MSN RN CNS WTA Entered By: Midge Aver on 08/07/2022 08:00:39 -------------------------------------------------------------------------------- Clinic Level of Care Assessment Details Patient Name: Date of Service: Carol Kent 08/07/2022 7:45 A M Medical Record Number: [id number] Patient Account Number: [id number] Date of Birth/Sex: Treating RN: 1940-12-18 (82 y.o. Carol Kent Primary Care Catelynn Sparger: [name] Other Clinician: Referring Khilynn Borntreger: Treating Ralynn San/Extender: Florian Buff in Treatment: 4 Clinic Level of Care Assessment Items TOOL 4 Quantity Score X- 1 0 Use when only an EandM is performed on FOLLOW-UP visit ASSESSMENTS - Nursing Assessment / Reassessment X- 1 10 Reassessment of Co-morbidities (includes updates in patient status) X- 1 5 Reassessment of Adherence to Treatment Plan ASSESSMENTS - Wound and Skin A ssessment / Reassessment []  - 0 Simple Wound Assessment / Reassessment - one  wound X- 3 5 Complex Wound Assessment / Reassessment - multiple wounds []  - 0 Dermatologic / Skin Assessment (not related to wound area) ASSESSMENTS - Focused Assessment []  - 0 Circumferential Edema Measurements - multi extremities []  - 0 Nutritional Assessment / Counseling / Intervention []  - 0 Lower Extremity Assessment (monofilament, tuning fork, pulses) []  - 0 Peripheral Arterial Disease Assessment (using hand held doppler) ASSESSMENTS - Ostomy and/or Continence Assessment and Care []  - 0 Incontinence Assessment and Management []  - 0 Ostomy Care Assessment and Management (repouching, etc.) PROCESS - Coordination of Care []  - 0 Simple Patient / Family Education for ongoing care X- 1 20 Complex (extensive) Patient / Family Education for ongoing care X- 1 10 Staff obtains Consents, Records, T Results / Process Orders est [name], [name] ([id number]) [id number] Page 2 of 10 []  - 0 Staff telephones HHA, Nursing Homes / Clarify orders / etc []  - 0 Routine Transfer to another Facility (non-emergent condition) []  - 0 Routine Hospital Admission (non-emergent condition) []  - 0 New Admissions / Manufacturing engineer / Ordering NPWT Apligraf, etc. , []  - 0 Emergency Hospital Admission (emergent condition) []  - 0 Simple Discharge Coordination X- 1 15 Complex (extensive) Discharge Coordination PROCESS - Special Needs []  - 0 Pediatric / Minor Patient Management []  - 0 Isolation Patient Management []  - 0 Hearing / Language / Visual special needs []  - 0 Assessment of Community assistance (transportation, D/C planning, etc.) []  - 0 Additional assistance / Altered mentation []  - 0 Support Surface(s) Assessment (bed, cushion, seat, etc.) INTERVENTIONS - Wound Cleansing / Measurement []  - 0 Simple Wound Cleansing - one wound X- 3 5 Complex Wound Cleansing - multiple wounds X- 1 5 Wound Imaging (photographs - any number of wounds) []  -  0 Wound Tracing (instead of photographs) []  - 0 Simple Wound Measurement - one wound []  - 0 Complex Wound Measurement - multiple wounds INTERVENTIONS - Wound Dressings []  -  0 Small Wound Dressing one or multiple wounds X- 3 15 Medium Wound Dressing one or multiple wounds []  - 0 Large Wound Dressing one or multiple wounds []  - 0 Application of Medications - topical []  - 0 Application of Medications - injection INTERVENTIONS - Miscellaneous []  - 0 External ear exam []  - 0 Specimen Collection (cultures, biopsies, blood, body fluids, etc.) []  - 0 Specimen(s) / Culture(s) sent or taken to Lab for analysis []  - 0 Patient Transfer (multiple staff / Michiel Sites Lift / Similar devices) []  - 0 Simple Staple / Suture removal (25 or less) []  - 0 Complex Staple / Suture removal (26 or more) []  - 0 Hypo / Hyperglycemic Management (close monitor of Blood Glucose) []  - 0 Ankle / Brachial Index (ABI) - do not check if billed separately X- 1 5 Vital Signs Has the patient been seen at the hospital within the last three years: Yes Total Score: 145 Level Of Care: New/Established - Level 4 Electronic Signature(s) Signed: 08/07/2022 3:10:23 PM By: Midge Aver MSN RN CNS WTA Entered By: Midge Aver on 08/07/2022 08:46:35 Carol Kent ([id number]) [id number] Page 3 of 10 -------------------------------------------------------------------------------- Encounter Discharge Information Details Patient Name: Date of Service: Carol Kent, Carol Kent 08/07/2022 7:45 A M Medical Record Number: [id number] Patient Account Number: [id number] Date of Birth/Sex: Treating RN: 05-Dec-1940 (82 y.o. Carol Kent Primary Care Lagretta Loseke: [name] Other Clinician: Referring Arshia Rondon: Treating Rawleigh Rode/Extender: Florian Buff in Treatment: 4 Encounter Discharge Information Items Discharge Condition: Stable Ambulatory Status: Ambulatory Discharge Destination:  Home Transportation: Private Auto Accompanied By: daughter Schedule Follow-up Appointment: Yes Clinical Summary of Care: Electronic Signature(s) Signed: 08/07/2022 3:10:23 PM By: Midge Aver MSN RN CNS WTA Entered By: Midge Aver on 08/07/2022 08:47:56 -------------------------------------------------------------------------------- Lower Extremity Assessment Details Patient Name: Date of Service: Carol Kent 08/07/2022 7:45 A M Medical Record Number: [id number] Patient Account Number: [id number] Date of Birth/Sex: Treating RN: 1941/02/24 (82 y.o. Carol Kent Primary Care Allure Greaser: [name] Other Clinician: Referring Mikeala Girdler: Treating Eleasha Cataldo/Extender: Florian Buff in Treatment: 4 Edema Assessment Assessed: [Left: No] [Right: No] [Left: Edema] [Right: :] Calf Left: Right: Point of Measurement: 33 cm From Medial Instep 34.5 cm Ankle Left: Right: Point of Measurement: 10 cm From Medial Instep 30 cm Vascular Assessment Pulses: Dorsalis Pedis Palpable: [Right:Yes] Electronic Signature(s) Signed: 08/07/2022 3:10:23 PM By: Midge Aver MSN RN CNS WTA Entered By: Midge Aver on 08/07/2022 08:45:40 -------------------------------------------------------------------------------- Multi Wound Chart Details Patient Name: Date of Service: Carol Kent 08/07/2022 7:45 A M Medical Record Number: [id number] Patient Account Number: [id number] Date of Birth/Sex: Treating RN: 17-Dec-1940 (82 y.o. Carol Kent Primary Care Jullian Previti: [name] Other Clinician: Referring Deejay Koppelman: Treating Stephani Janak/Extender: Florian Buff in Treatment: 4 Vital Signs Height(in): 62 Pulse(bpm): [address] ([id number]) [id number] Page 4 of 10 Weight(lbs): 150 Blood Pressure(mmHg): 122/56 Body Mass Index(BMI): 27.4 Temperature(F): 97.9 Respiratory Rate(breaths/min): 16 [1:Photos:] [3:No Photos] Right  Lower Leg Right, Distal, Medial Lower Leg Right, Posterior Lower Leg Wound Location: Skin Tear/Laceration Skin Tear/Laceration Blister Wounding Event: Skin Tear Skin Tear T be determined o Primary Etiology: Cataracts, Anemia, Congestive Heart Cataracts, Anemia, Congestive Heart Cataracts, Anemia, Congestive Heart Comorbid History: Failure, Rheumatoid Arthritis Failure, Rheumatoid Arthritis Failure, Rheumatoid Arthritis 06/19/2022 07/14/2022 07/30/2022 Date Acquired: 4 3 0 Weeks of Treatment: Open Healed - Epithelialized Open Wound Status: No No No Wound Recurrence: 6x5x0.1 0x0x0 1x2.3x0.1 Measurements L x W x D (cm) 23.562 0 1.806  A (cm) : rea 2.356 0 0.181 Volume (cm) : 72.20% 100.00% N/A % Reduction in Area: 72.20% 100.00% N/A % Reduction in Volume: Full Thickness Without Exposed Full Thickness Without Exposed Full Thickness Without Exposed Classification: Support Structures Support Structures Support Structures Medium Medium Medium Exudate Amount: Serosanguineous Serosanguineous Serosanguineous Exudate Type: red, brown red, brown red, brown Exudate Color: Medium (34-66%) Large (67-100%) Small (1-33%) Granulation Amount: Red Red, Pink Red, Friable Granulation Quality: Medium (34-66%) Small (1-33%) None Present (0%) Necrotic Amount: Fat Layer (Subcutaneous Tissue): Yes Fat Layer (Subcutaneous Tissue): Yes Fascia: No Exposed Structures: Fat Layer (Subcutaneous Tissue): No Tendon: No Muscle: No Joint: No Bone: No Small (1-33%) None Small (1-33%) Epithelialization: Treatment Notes Electronic Signature(s) Signed: 08/07/2022 3:10:23 PM By: Midge Aver MSN RN CNS WTA Previous Signature: 08/07/2022 8:21:22 AM Version By: Midge Aver MSN RN CNS WTA Entered By: Midge Aver on 08/07/2022 08:45:47 -------------------------------------------------------------------------------- Multi-Disciplinary Care Plan Details Patient Name: Date of Service: Carol Kent.  08/07/2022 7:45 A M Medical Record Number: [id number] Patient Account Number: [id number] Date of Birth/Sex: Treating RN: 1940-12-19 (82 y.o. Carol Kent Primary Care Lyn Deemer: [name] Other Clinician: Referring Geovannie Vilar: Treating Odas Ozer/Extender: Florian Buff in Treatment: 4 Active Inactive Abuse / Safety / Falls / Self Care Management Nursing Diagnoses: History of Falls Impaired physical mobility Potential for falls Potential for injury related to falls Carol Kent, Carol Kent ([id number]) [id number] Page 5 of 10 Goals: Patient will not develop complications from immobility Date Initiated: 07/07/2022 Date Inactivated: 08/07/2022 Target Resolution Date: 08/04/2022 Goal Status: Met Patient will not experience any injury related to falls Date Initiated: 07/07/2022 Date Inactivated: 08/07/2022 Target Resolution Date: 08/04/2022 Goal Status: Met Patient will remain injury free related to falls Date Initiated: 07/07/2022 Target Resolution Date: 08/18/2022 Goal Status: Active Patient/caregiver will demonstrate safe use of adaptive devices to increase mobility Date Initiated: 07/07/2022 Target Resolution Date: 08/04/2022 Goal Status: Active Patient/caregiver will verbalize understanding of skin care regimen Date Initiated: 07/07/2022 Date Inactivated: 07/07/2022 Target Resolution Date: 07/07/2022 Goal Status: Met Patient/caregiver will verbalize/demonstrate measures taken to improve the patient's personal safety Date Initiated: 07/07/2022 Target Resolution Date: 08/11/2022 Goal Status: Active Patient/caregiver will verbalize/demonstrate measures taken to prevent injury and/or falls Date Initiated: 07/07/2022 Target Resolution Date: 08/11/2022 Goal Status: Active Interventions: Assess Activities of Daily Living upon admission and as needed Assess fall risk on admission and as needed Assess impairment of mobility on admission and as needed per  policy Assess personal safety and home safety (as indicated) on admission and as needed Provide education on fall prevention Provide education on personal and home safety Provide education on safe transfers Notes: Necrotic Tissue Nursing Diagnoses: Impaired tissue integrity related to necrotic/devitalized tissue Knowledge deficit related to management of necrotic/devitalized tissue Goals: Necrotic/devitalized tissue will be minimized in the wound bed Date Initiated: 07/07/2022 Target Resolution Date: 08/04/2022 Goal Status: Active Patient/caregiver will verbalize understanding of reason and process for debridement of necrotic tissue Date Initiated: 07/07/2022 Date Inactivated: 07/07/2022 Target Resolution Date: 07/07/2022 Goal Status: Met Interventions: Assess patient pain level pre-, during and post procedure and prior to discharge Provide education on necrotic tissue and debridement process Treatment Activities: Biologic debridement : 07/07/2022 Notes: Wound/Skin Impairment Nursing Diagnoses: Impaired tissue integrity Knowledge deficit related to ulceration/compromised skin integrity Goals: Ulcer/skin breakdown will have a volume reduction of 30% by week 4 Date Initiated: 07/07/2022 Target Resolution Date: 08/04/2022 Goal Status: Active Ulcer/skin breakdown will have a volume reduction of 50% by week 8 Date Initiated: 07/07/2022 Target  Resolution Date: 09/01/2022 Goal Status: Active Ulcer/skin breakdown will have a volume reduction of 80% by week 12 Date Initiated: 07/07/2022 Target Resolution Date: 09/29/2022 Goal Status: Active Ulcer/skin breakdown will heal within 14 weeks Date Initiated: 07/07/2022 Target Resolution Date: 10/13/2022 Carol Kent, Carol Kent ([id number]) [id number] Page 6 of 10 Goal Status: Active Interventions: Assess patient/caregiver ability to obtain necessary supplies Assess patient/caregiver ability to perform ulcer/skin care regimen upon  admission and as needed Assess ulceration(s) every visit Provide education on ulcer and skin care Treatment Activities: Skin care regimen initiated : 07/07/2022 Notes: Electronic Signature(s) Signed: 08/07/2022 9:14:52 AM By: Midge Aver MSN RN CNS WTA Entered By: Midge Aver on 08/07/2022 09:14:52 -------------------------------------------------------------------------------- Pain Assessment Details Patient Name: Date of Service: Carol Kent 08/07/2022 7:45 A M Medical Record Number: [id number] Patient Account Number: [id number] Date of Birth/Sex: Treating RN: 03-08-1941 (82 y.o. Carol Kent Primary Care Kitrina Maurin: [name] Other Clinician: Referring Yeraldy Spike: Treating Ran Tullis/Extender: Florian Buff in Treatment: 4 Active Problems Location of Pain Severity and Description of Pain Patient Has Paino No Site Locations Pain Management and Medication Current Pain Management: Electronic Signature(s) Signed: 08/07/2022 3:10:23 PM By: Midge Aver MSN RN CNS WTA Entered By: Midge Aver on 08/07/2022 08:03:50 -------------------------------------------------------------------------------- Patient/Caregiver Education Details Patient Name: Date of Service: Carol Kent 5/9/2024andnbsp7:45 A M Medical Record Number: [id number] Patient Account Number: [id number] Date of Birth/Gender: Treating RN: Jul 05, 1940 (82 y.o. Carol Kent Primary Care Physician: [name] Other Clinician: Referring Physician: Treating Physician/Extender: Florian Buff in Treatment: 4 Education Assessment Carol Kent, Carol Kent ([id number]) [id number] Page 7 of 10 Education Provided To: Patient Education Topics Provided Wound/Skin Impairment: Handouts: Caring for Your Ulcer Methods: Explain/Verbal Responses: State content correctly Electronic Signature(s) Signed: 08/07/2022 3:10:23 PM By: Midge Aver MSN RN CNS  WTA Entered By: Midge Aver on 08/07/2022 08:46:55 -------------------------------------------------------------------------------- Wound Assessment Details Patient Name: Date of Service: Carol Kent 08/07/2022 7:45 A M Medical Record Number: [id number] Patient Account Number: [id number] Date of Birth/Sex: Treating RN: 1941-02-21 (82 y.o. Carol Kent Primary Care Danny Zimny: [name] Other Clinician: Referring Abijah Roussel: Treating Dalma Panchal/Extender: Florian Buff in Treatment: 4 Wound Status Wound Number: 1 Primary Skin Tear Etiology: Wound Location: Right Lower Leg Wound Status: Open Wounding Event: Skin Tear/Laceration Comorbid Cataracts, Anemia, Congestive Heart Failure, Rheumatoid Date Acquired: 06/19/2022 History: Arthritis Weeks Of Treatment: 4 Clustered Wound: No Photos Wound Measurements Length: (cm) 6 Width: (cm) 5 Depth: (cm) 0.1 Area: (cm) 23.562 Volume: (cm) 2.356 % Reduction in Area: 72.2% % Reduction in Volume: 72.2% Epithelialization: Small (1-33%) Wound Description Classification: Full Thickness Without Exposed Suppor Exudate Amount: Medium Exudate Type: Serosanguineous Exudate Color: red, brown t Structures Foul Odor After Cleansing: No Slough/Fibrino Yes Wound Bed Granulation Amount: Medium (34-66%) Exposed Structure Granulation Quality: Red Fat Layer (Subcutaneous Tissue) Exposed: Yes Necrotic Amount: Medium (34-66%) Treatment Notes Wound #1 (Lower Leg) Wound Laterality: Right Carol Kent, Carol Kent ([id number]) [id number] Page 8 of 10 Cleanser Soap and Water Discharge Instruction: Gently cleanse wound with antibacterial soap, rinse and pat dry prior to dressing wounds Peri-Wound Care Topical Primary Dressing Xeroform 5x9-HBD (in/in) Discharge Instruction: Apply Xeroform 5x9-HBD (in/in) as directed Double layer Secondary Dressing ABD Pad 5x9 (in/in) Discharge Instruction: Cover with ABD  pad Secured With Medipore T - 57M Medipore H Soft Cloth Surgical T ape ape, 2x2 (in/yd) Conform 4'' - Conforming Stretch Gauze Bandage 4x75 (in/in) Discharge Instruction: Apply as directed Tubigrip Size F, 4x10 (in/yd) Discharge Instruction:  Apply Tubigrip F 3 finger-widths below knee to base of toes to secure dressing and/or for swelling. Single layer Compression Wrap Compression Stockings Add-Ons Electronic Signature(s) Signed: 08/07/2022 3:10:23 PM By: Midge Aver MSN RN CNS WTA Entered By: Midge Aver on 08/07/2022 08:17:05 -------------------------------------------------------------------------------- Wound Assessment Details Patient Name: Date of Service: Carol Kent 08/07/2022 7:45 A M Medical Record Number: [id number] Patient Account Number: [id number] Date of Birth/Sex: Treating RN: 1940/10/03 (82 y.o. Carol Kent Primary Care Blessed Girdner: [name] Other Clinician: Referring Oluwadamilola Rosamond: Treating Tien Aispuro/Extender: Florian Buff in Treatment: 4 Wound Status Wound Number: 3 Primary Skin Tear Etiology: Wound Location: Right, Distal, Medial Lower Leg Wound Status: Healed - Epithelialized Wounding Event: Skin Tear/Laceration Comorbid Cataracts, Anemia, Congestive Heart Failure, Rheumatoid Date Acquired: 07/14/2022 History: Arthritis Weeks Of Treatment: 3 Clustered Wound: No Wound Measurements Length: (cm) Width: (cm) Depth: (cm) Area: (cm) Volume: (cm) 0 % Reduction in Area: 100% 0 % Reduction in Volume: 100% 0 Epithelialization: None 0 0 Wound Description Classification: Full Thickness Without Exposed Support Structures Exudate Amount: Medium Exudate Type: Serosanguineous Exudate Color: red, brown Foul Odor After Cleansing: No Slough/Fibrino Yes Wound Bed Granulation Amount: Large (67-100%) Exposed Structure Granulation Quality: Red, Pink Fat Layer (Subcutaneous Tissue) Exposed: Yes Necrotic Amount: Small (1-33%) Treatment  Notes Wound #3 (Lower Leg) Wound Laterality: Right, Medial, Distal Carol Kent, Carol Kent ([id number]) [id number] Page 9 of 10 Cleanser Peri-Wound Care Topical Primary Dressing Secondary Dressing Secured With Compression Wrap Compression Stockings Add-Ons Electronic Signature(s) Signed: 08/07/2022 3:10:23 PM By: Midge Aver MSN RN CNS WTA Entered By: Midge Aver on 08/07/2022 08:45:36 -------------------------------------------------------------------------------- Wound Assessment Details Patient Name: Date of Service: Carol Kent 08/07/2022 7:45 A M Medical Record Number: [id number] Patient Account Number: [id number] Date of Birth/Sex: Treating RN: 11/24/1940 (82 y.o. Carol Kent Primary Care Milee Qualls: [name] Other Clinician: Referring Taiquan Campanaro: Treating Almadelia Looman/Extender: Florian Buff in Treatment: 4 Wound Status Wound Number: 4 Primary T be determined o Etiology: Wound Location: Right, Posterior Lower Leg Wound Status: Open Wounding Event: Blister Comorbid Cataracts, Anemia, Congestive Heart Failure, Rheumatoid Date Acquired: 07/30/2022 History: Arthritis Weeks Of Treatment: 0 Clustered Wound: No Photos Wound Measurements Length: (cm) 1 Width: (cm) 2.3 Depth: (cm) 0.1 Area: (cm) 1.806 Volume: (cm) 0.181 % Reduction in Area: % Reduction in Volume: Epithelialization: Small (1-33%) Tunneling: No Undermining: No Wound Description Classification: Full Thickness Without Exposed Suppor Exudate Amount: Medium Exudate Type: Serosanguineous Exudate Color: red, brown t Structures Foul Odor After Cleansing: No Slough/Fibrino No Wound Bed Granulation Amount: Small (1-33%) Exposed Structure Granulation Quality: Red, Friable Fascia Exposed: No Necrotic Amount: None Present (0%) Fat Layer (Subcutaneous Tissue) Exposed: No Tendon Exposed: No Muscle Exposed: No Joint Exposed: No Carol Kent, Carol Kent ([id number])  [id number] Page 10 of 10 Bone Exposed: No Treatment Notes Wound #4 (Lower Leg) Wound Laterality: Right, Posterior Cleanser Soap and Water Discharge Instruction: Gently cleanse wound with antibacterial soap, rinse and pat dry prior to dressing wounds Peri-Wound Care Topical Primary Dressing Xeroform 5x9-HBD (in/in) Discharge Instruction: Apply Xeroform 5x9-HBD (in/in) as directed Double layer Secondary Dressing ABD Pad 5x9 (in/in) Discharge Instruction: Cover with ABD pad Secured With Medipore T - 57M Medipore H Soft Cloth Surgical T ape ape, 2x2 (in/yd) Conform 4'' - Conforming Stretch Gauze Bandage 4x75 (in/in) Discharge Instruction: Apply as directed Tubigrip Size F, 4x10 (in/yd) Discharge Instruction: Apply Tubigrip F 3 finger-widths below knee to base of toes to secure dressing and/or for swelling. Single layer Compression Wrap Compression  Stockings Facilities manager) Signed: 08/07/2022 3:10:23 PM By: Midge Aver MSN RN CNS WTA Entered By: Midge Aver on 08/07/2022 08:16:34 -------------------------------------------------------------------------------- Vitals Details Patient Name: Date of Service: Carol Kent 08/07/2022 7:45 A M Medical Record Number: [id number] Patient Account Number: [id number] Date of Birth/Sex: Treating RN: [birth year] (82 y.o. Carol Kent Primary Care Alano Blasco: [name] Other Clinician: Referring Aleira Deiter: Treating Taeko Schaffer/Extender: Florian Buff in Treatment: 4 Vital Signs Time Taken: 08:00 Temperature (F): 97.9 Height (in): 62 Pulse (bpm): 92 Weight (lbs): 150 Respiratory Rate (breaths/min): 16 Body Mass Index (BMI): 27.4 Blood Pressure (mmHg): 122/56 Reference Range: 80 - 120 mg / dl Electronic Signature(s) Signed: 08/07/2022 3:10:23 PM By: Midge Aver MSN RN CNS WTA Entered By: Midge Aver on 08/07/2022 08:03:42

## 2022-08-08 ENCOUNTER — Ambulatory Visit: Payer: Medicare Other | Admitting: Physician Assistant

## 2022-08-14 ENCOUNTER — Ambulatory Visit: Payer: Medicare Other | Admitting: Physician Assistant

## 2022-08-15 ENCOUNTER — Encounter: Payer: Medicare Other | Admitting: Physician Assistant

## 2022-08-15 DIAGNOSIS — I87331 Chronic venous hypertension (idiopathic) with ulcer and inflammation of right lower extremity: Secondary | ICD-10-CM | POA: Diagnosis not present

## 2022-08-15 NOTE — Progress Notes (Signed)
[name], [name] ([id number]) [id number] Page 1 of 2 Visit Report for 08/15/2022 Chief Complaint Document Details Patient Name: Date of Service: Carol Kent, Carol Kent 08/15/2022 8:30 A M Medical Record Number: [id number] Patient Account Number: [id number] Date of Birth/Sex: Treating RN: 05-Oct-1940 (82 y.o. [name] Primary Care Provider: [name] Other Clinician: Betha Loa Referring Provider: Treating Provider/Extender: Florian Buff in Treatment: 5 Information Obtained from: Patient Chief Complaint Right LE Ulcers/Skin Tears Electronic Signature(s) Signed: 08/15/2022 8:52:47 AM By: Allen Derry PA-C Entered By: Allen Derry on 08/15/2022 08:52:47 -------------------------------------------------------------------------------- Problem List Details Patient Name: Date of Service: [name] 08/15/2022 8:30 A M Medical Record Number: [id number] Patient Account Number: [id number] Date of Birth/Sex: Treating RN: Nov 04, 1940 (82 y.o. 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

## 2022-08-18 ENCOUNTER — Ambulatory Visit: Payer: Medicare Other | Attending: Cardiology

## 2022-08-18 DIAGNOSIS — R600 Localized edema: Secondary | ICD-10-CM

## 2022-08-18 DIAGNOSIS — R0602 Shortness of breath: Secondary | ICD-10-CM | POA: Insufficient documentation

## 2022-08-18 LAB — ECHOCARDIOGRAM COMPLETE
AR max vel: 1.28 cm2
AV Area VTI: 1.18 cm2
AV Area mean vel: 1.25 cm2
AV Mean grad: 5 mmHg
AV Peak grad: 8.9 mmHg
Ao pk vel: 1.5 m/s
Area-P 1/2: 4.36 cm2
MV VTI: 1.1 cm2
S' Lateral: 2.8 cm

## 2022-08-18 NOTE — Progress Notes (Signed)
Heart squeeze is 60-65%, no wall motion abnormalities, mild to moderate leakage noted in the mitral and tricuspid valves, largely unchanged from previous study. Continue current medication regimen and keep follow-up as scheduled.

## 2022-08-22 ENCOUNTER — Ambulatory Visit: Payer: Medicare Other | Admitting: Internal Medicine

## 2022-08-27 DIAGNOSIS — F0154 Vascular dementia, unspecified severity, with anxiety: Secondary | ICD-10-CM | POA: Diagnosis present

## 2022-08-28 ENCOUNTER — Other Ambulatory Visit: Payer: Self-pay | Admitting: Nephrology

## 2022-08-28 DIAGNOSIS — D631 Anemia in chronic kidney disease: Secondary | ICD-10-CM

## 2022-08-28 DIAGNOSIS — R8290 Unspecified abnormal findings in urine: Secondary | ICD-10-CM

## 2022-08-28 DIAGNOSIS — N184 Chronic kidney disease, stage 4 (severe): Secondary | ICD-10-CM

## 2022-08-28 DIAGNOSIS — R809 Proteinuria, unspecified: Secondary | ICD-10-CM

## 2022-08-29 ENCOUNTER — Encounter: Payer: Self-pay | Admitting: Cardiology

## 2022-08-29 ENCOUNTER — Ambulatory Visit: Payer: Medicare Other | Attending: Cardiology | Admitting: Cardiology

## 2022-08-29 VITALS — BP 123/78 | HR 109 | Ht 60.0 in | Wt 154.8 lb

## 2022-08-29 DIAGNOSIS — N184 Chronic kidney disease, stage 4 (severe): Secondary | ICD-10-CM | POA: Insufficient documentation

## 2022-08-29 DIAGNOSIS — R600 Localized edema: Secondary | ICD-10-CM | POA: Insufficient documentation

## 2022-08-29 DIAGNOSIS — I10 Essential (primary) hypertension: Secondary | ICD-10-CM | POA: Diagnosis present

## 2022-08-29 DIAGNOSIS — R000 Tachycardia, unspecified: Secondary | ICD-10-CM | POA: Diagnosis present

## 2022-08-29 DIAGNOSIS — I5032 Chronic diastolic (congestive) heart failure: Secondary | ICD-10-CM | POA: Insufficient documentation

## 2022-08-29 DIAGNOSIS — I739 Peripheral vascular disease, unspecified: Secondary | ICD-10-CM | POA: Insufficient documentation

## 2022-08-29 NOTE — Patient Instructions (Signed)
Medication Instructions:  Your physician recommends that you continue on your current medications as directed. Please refer to the Current Medication list given to you today.  *If you need a refill on your cardiac medications before your next appointment, please call your pharmacy*  Lab Work: -None ordered If you have labs (blood work) drawn today and your tests are completely normal, you will receive your results only by: MyChart Message (if you have MyChart) OR A paper copy in the mail If you have any lab test that is abnormal or we need to change your treatment, we will call you to review the results.  Testing/Procedures: -None ordered  Follow-Up: At [facility], you and your health needs are our priority.  As part of our continuing mission to provide you with exceptional heart care, we have created designated Provider Care Teams.  These Care Teams include your primary Cardiologist (physician) and Advanced Practice Providers (APPs -  Physician Assistants and Nurse Practitioners) who all work together to provide you with the care you need, when you need it.  Your next appointment:   3 month(s)  Provider:   Julien Nordmann, MD   Other Instructions -None
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
## 2022-09-02 ENCOUNTER — Encounter: Payer: Medicare Other | Attending: Physician Assistant | Admitting: Physician Assistant

## 2022-09-02 ENCOUNTER — Telehealth: Payer: Self-pay

## 2022-09-02 DIAGNOSIS — I5042 Chronic combined systolic (congestive) and diastolic (congestive) heart failure: Secondary | ICD-10-CM | POA: Diagnosis not present

## 2022-09-02 DIAGNOSIS — N184 Chronic kidney disease, stage 4 (severe): Secondary | ICD-10-CM | POA: Insufficient documentation

## 2022-09-02 DIAGNOSIS — S81811A Laceration without foreign body, right lower leg, initial encounter: Secondary | ICD-10-CM | POA: Diagnosis present

## 2022-09-02 DIAGNOSIS — L97812 Non-pressure chronic ulcer of other part of right lower leg with fat layer exposed: Secondary | ICD-10-CM | POA: Diagnosis not present

## 2022-09-02 DIAGNOSIS — I7389 Other specified peripheral vascular diseases: Secondary | ICD-10-CM | POA: Insufficient documentation

## 2022-09-02 DIAGNOSIS — I87331 Chronic venous hypertension (idiopathic) with ulcer and inflammation of right lower extremity: Secondary | ICD-10-CM | POA: Insufficient documentation

## 2022-09-02 NOTE — Progress Notes (Signed)
Carol, Kent ([id number]) [id number] Page 1 of 3 Visit Report for 09/02/2022 Chief Complaint Document Details Patient Name: Date of Service: Carol Kent, Carol Kent 09/02/2022 10:00 A M Medical Record Number: [id number] Patient Account Number: [id number] Date of Birth/Sex: Treating RN: 06-10-1940 (82 y.o. [name] Primary Care Provider: [name] Other Clinician: Betha Loa Referring Provider: Treating Provider/Extender: Florian Buff in Treatment: 8 Information Obtained from: Patient Chief Complaint Right LE Ulcers/Skin Tears and Left Knee Skin Tear Electronic Signature(s) Signed: 09/02/2022 10:47:28 AM By: Allen Derry PA-C Previous Signature: 09/02/2022 10:33:19 AM Version By: Allen Derry PA-C Entered By: Allen Derry on 09/02/2022 10:47:27 -------------------------------------------------------------------------------- Physician Orders Details Patient Name: Date of Service: Carol Kent 09/02/2022 10:00 A M Medical Record Number: [id number] Patient Account Number: [id number] Date of Birth/Sex: Treating RN: 1941/01/23 (82 y.o. 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 Home Health Home Health Company: - [facility] ADMIT to Home Health for wound care. May utilize formulary equivalent dressing for wound treatment orders unless otherwise specified. Home Health Nurse may visit PRN to address patients wound care needs. CONTINUE Home Health for wound care. May utilize formulary equivalent dressing for wound treatment orders unless otherwise specified. Home Health Nurse may visit PRN to address patients wound care needs. Scheduled days for dressing changes to be completed; exception, patient has scheduled wound care visit that day. **Please direct any NON-WOUND related issues/requests for orders to patient's Primary Care Physician. **If current dressing causes regression in wound condition, may D/C ordered dressing product/s and apply Normal Saline Moist Dressing daily until next Wound Healing Center or Other MD appointment. **Notify Wound Healing Center of regression in wound condition at [phone]. Other Home Health Orders/Instructions: - for tubi grip size, please place on legs just above toes and 2 fingers above knee for swelling Bathing/ Shower/ Hygiene May shower; gently cleanse wound with antibacterial soap, rinse and pat dry prior to dressing wounds - using dial soap No tub bath. Anesthetic (Use 'Patient Medications' Section for Anesthetic Order Entry) Lidocaine applied to wound bed Edema Control - Lymphedema / Segmental Compressive Device / Other Tubigrip single layer applied. - size F left lower leg DELAINA, [name] ([id number]) [id number] Page 2 of 3 Wound Treatment Wound #5 - Knee Wound Laterality: Right, Medial Cleanser: Soap and Water Every Other Day/30 Days Discharge Instructions: Gently cleanse wound with antibacterial soap, rinse and pat dry prior to dressing wounds Prim Dressing: Xeroform 5x9-HBD (in/in) Every Other Day/30  Days ary Discharge Instructions: Apply Xeroform 5x9-HBD (in/in) as directed Double layer Secondary Dressing: ABD Pad 5x9 (in/in) Every Other Day/30 Days Discharge Instructions: Cover with ABD pad Secured With: Medipore T - 37M Medipore H Soft Cloth Surgical T ape ape, 2x2 (in/yd) Every Other Day/30 Days Secured With: Conform 4'' - Conforming Stretch Gauze Bandage 4x75 (in/in) Every Other  Day/30 Days Discharge Instructions: Apply as directed Secured With: Tubigrip Size F, 4x10 (in/yd) Every Other Day/30 Days Discharge Instructions: Apply Tubigrip F 3 finger-widths above knee and base of toes to secure dressing and/or for swelling. Single layer Wound #7 - Knee Wound Laterality: Left Cleanser: Soap and Water Every Other Day/30 Days Discharge Instructions: Gently cleanse wound with antibacterial soap, rinse and pat dry prior to dressing wounds Prim Dressing: Xeroform 5x9-HBD (in/in) Every Other Day/30 Days ary Discharge Instructions: Apply Xeroform 5x9-HBD (in/in) as directed Double layer Secondary Dressing: ABD Pad 5x9 (in/in) Every Other Day/30 Days Discharge Instructions: Cover with ABD pad Secured With: Medipore T - 21M Medipore H Soft Cloth Surgical T ape ape, 2x2 (in/yd) Every Other Day/30 Days Secured With: Conform 4'' - Conforming Stretch Gauze Bandage 4x75 (in/in) Every Other Day/30 Days Discharge Instructions: Apply as directed Secured With: Tubigrip Size F, 4x10 (in/yd) Every Other Day/30 Days Discharge Instructions: Apply Tubigrip F 3 finger-widths above knee and base of toes to secure dressing and/or for swelling. Single layer Electronic Signature(s) Unsigned Entered By: Betha Loa on 09/02/2022 11:05:13 -------------------------------------------------------------------------------- Problem List Details Patient Name: Date of Service: [name], [name] 09/02/2022 10:00 A M Medical Record Number: [id number] Patient Account Number: [id number] Date of Birth/Sex: Treating  RN: [birth year] (82 y.o. [name] Primary Care Provider: [name] Other Clinician: Betha Loa Referring Provider: Treating Provider/Extender: Florian Buff in Treatment: 8 Active Problems ICD-10 Encounter Code Description Active Date MDM Diagnosis [phone] Laceration without foreign body, right lower leg, initial encounter 07/07/2022 No Yes L97.812 Non-pressure chronic ulcer of other part of right lower leg with fat layer 07/07/2022 No Yes exposed S81.012A Laceration without foreign body, left knee, initial encounter 09/02/2022 No Yes ALLINSON, PORTELLI ([id number]) [id number] Page 3 of 3 N18.4 Chronic kidney disease, stage 4 (severe) 07/07/2022 No Yes I50.42 Chronic combined systolic (congestive) and diastolic (congestive) heart failure 07/07/2022 No Yes 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 right 07/07/2022 No Yes lower extremity I73.89 Other specified peripheral vascular diseases 07/07/2022 No Yes Inactive Problems Resolved Problems Electronic Signature(s) Signed: 09/02/2022 10:47:09 AM By: Allen Derry PA-C Previous Signature: 09/02/2022 10:33:14 AM Version By: Allen Derry PA-C Entered By: Allen Derry on 09/02/2022 10:47:08 -------------------------------------------------------------------------------- SuperBill Details Patient Name: Date of Service: Carol Kent 09/02/2022 Medical Record Number: [id number] Patient Account Number: [id number] Date of Birth/Sex: Treating RN: 1940-09-23 (82 y.o. 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

## 2022-09-02 NOTE — Telephone Encounter (Signed)
(  4:13 PM) Outreach to patients daughter to schedule initial PC consult. No answer and unable to LVM.  (4:14 pm) outreach to patient home number to schedule initial PC consult. No answer and unable to LVM,

## 2022-09-03 NOTE — Progress Notes (Signed)
DOVA, BACOTE ([id number]) [id number] Page 1 of 1 Visit Report for 09/02/2022 Fall Risk Assessment Details Patient Name: Date of Service: Carol Kent, Carol Kent 09/02/2022 10:00 A M Medical Record Number: [id number] Patient Account Number: [id number] Date of Birth/Sex: Treating RN: Apr 29, 1940 (82 y.o. [name] Primary Care Keller Bounds: [name] Other Clinician: Betha Loa Referring Lotoya Casella: Treating Mckenzi Buonomo/Extender: Florian Buff in Treatment: 8 Fall Risk Assessment Items Have you had 2 or more falls in the last 12 monthso 0 Yes Have you had any fall that resulted in injury in the last 12 monthso 0 Yes FALLS RISK SCREEN History of falling - immediate or within 3 months 25 Yes Secondary diagnosis (Do you have 2 or more medical diagnoseso) 15 Yes Ambulatory aid None/bed rest/wheelchair/nurse 0 Yes Crutches/cane/walker 15 Yes Furniture 30 Yes Intravenous therapy Access/Saline/Heparin Lock 0 No Gait/Transferring Normal/ bed rest/ wheelchair 0 Yes Weak (short steps with or without shuffle, stooped but able to lift head while walking, may seek 10 Yes support from furniture) Impaired (short steps with shuffle, may have difficulty arising from chair, head down, impaired 0 No balance) Mental Status Oriented to own ability 0 Yes Electronic Signature(s) Signed: 09/02/2022 4:45:25 PM By: Midge Aver MSN RN CNS WTA Signed: 09/02/2022 5:02:52 PM By: Betha Loa Entered By: Betha Loa on 09/02/2022 10:33:26

## 2022-09-08 ENCOUNTER — Ambulatory Visit (INDEPENDENT_AMBULATORY_CARE_PROVIDER_SITE_OTHER): Payer: Medicare Other | Admitting: Vascular Surgery

## 2022-09-09 ENCOUNTER — Encounter: Payer: Medicare Other | Admitting: Physician Assistant

## 2022-09-09 ENCOUNTER — Ambulatory Visit
Admission: RE | Admit: 2022-09-09 | Discharge: 2022-09-09 | Disposition: A | Discharge status: Home / Self Care | Payer: Medicare Other | Source: Ambulatory Visit | Attending: Nephrology | Admitting: Nephrology

## 2022-09-09 DIAGNOSIS — D631 Anemia in chronic kidney disease: Secondary | ICD-10-CM

## 2022-09-09 DIAGNOSIS — R809 Proteinuria, unspecified: Secondary | ICD-10-CM | POA: Diagnosis present

## 2022-09-09 DIAGNOSIS — R8290 Unspecified abnormal findings in urine: Secondary | ICD-10-CM

## 2022-09-09 DIAGNOSIS — I87331 Chronic venous hypertension (idiopathic) with ulcer and inflammation of right lower extremity: Secondary | ICD-10-CM | POA: Diagnosis not present

## 2022-09-09 DIAGNOSIS — N184 Chronic kidney disease, stage 4 (severe): Secondary | ICD-10-CM | POA: Diagnosis present

## 2022-09-09 NOTE — Progress Notes (Signed)
Carol Kent ([id number]) [id number] Page 1 of 6 Visit Report for 09/09/2022 Chief Complaint Document Details Patient Name: Date of Service: Carol Kent 09/09/2022 12:00 PM Medical Record Number: [id number] Patient Account Number: [id number] Date of Birth/Sex: Treating RN: [birth year] (82 y.o. [name] Primary Care Provider: [name] Other Clinician: Referring Provider: Treating Provider/Extender: Florian Buff in Treatment: 9 Information Obtained from: Patient Chief Complaint Right LE Ulcers/Skin Tears and Left Knee Skin Tear Electronic Signature(s) Signed: 09/09/2022 12:32:11 PM By: Allen Derry PA-C Entered By: Allen Derry on 09/09/2022 12:32:11 -------------------------------------------------------------------------------- HPI Details Patient Name: Date of Service: Carol Kent. 09/09/2022 12:00 PM Medical Record Number: [id number] Patient Account Number: [id number] Date of Birth/Sex: Treating RN: [birth year] (82 y.o. [name] Primary Care Provider: [name] Other Clinician: Referring Provider: Treating Provider/Extender: Florian Buff in Treatment: 9 History of Present Illness HPI Description: 07-07-2022 upon evaluation today patient appears to be doing somewhat poorly in regard to wounds that actually began in the right leg as a result of trauma. She tells me that she has been having some discomfort it is not hurting too bad however. She was originally placed on Keflex but she is finished this. She did not go to the ER to have this checked out for 3 days after it initially happened so they were not able to Steri-Strip anything back down. She has been using bacitracin and Xeroform. She does have family members that have been helping her with dressing changes. With that being said she does not appear to have any signs of infection right now but nonetheless does have  obviously the wounds that need to be addressed. Patient does have a history of chronic kidney disease stage IV, congestive heart failure, lower extremity venous insufficiency and swelling although this is not too significant, and peripheral vascular disease as ABIs are noncompressible. She does seem to have good blood flow however. Her capillary refill was good and the wounds themselves do seem to be bleeding even with light cleaning. 07-14-2022 upon evaluation today patient appears to be doing well currently in regard to her wounds. She is actually showing signs of improvement which is good news. Fortunately there does not appear to be any signs of active infection locally nor systemically which is great news. No fevers, chills, nausea, vomiting, or diarrhea. 07-28-2022 upon evaluation today patient appears to be doing well currently in regard to her wounds which are actually measuring smaller this is great news. She does have a couple of blisters around the knee from where she bumped it but were not really sure exactly what she bumped it on. Nonetheless I am going to likely help to manage that as well now and see if we can prevent this from opening and turning into a wound. 08-07-2022 upon evaluation today patient appears to be doing better in regard to her right leg with original wound although she does have 1 that is open just below the original at the knee. This is where she had some of the blood blisters formed. Fortunately there does not appear to be any signs of active infection locally nor systemically. Unfortunately she still has a tremendous amount of swelling she does see cardiology at the end of the month but she runs out of her fluid pills tomorrow. They are going to see if they can get a refill until she can get in to be seen. 08-15-2022 upon evaluation today patient appears to be doing well  currently in general in regard to her wounds. Everything is showing signs of improvement except for  the newer wounds which do improve still have been on a slower path obviously due to just the nature of the wounds not being nearly as old. Fortunately there does not appear to be any signs of active infection locally nor systemically at this point. 09-02-2022 upon evaluation today patient appears to be doing excellent in regard to her right leg which is showing signs of improvement unfortunately left leg she has a new skin tear from a new fall. I am not sure how certain we will be able to approximate this especially being at the knee is going to be very tricky. 09-09-2022 upon evaluation today patient appears to be doing well currently in regard to her wound. In fact this is showing signs of improvement not everything tacked back down but we were able to get some of the skin to reapproximate the more clearly. Fortunately I do not see any signs of infection. Electronic Signature(s) Signed: 09/09/2022 12:43:55 PM By: Allen Derry PA-C Entered By: Allen Derry on 09/09/2022 12:43:54 Carol Kent ([id number]) [id number] Page 2 of 6 -------------------------------------------------------------------------------- Physical Exam Details Patient Name: Date of Service: Carol Kent 09/09/2022 12:00 PM Medical Record Number: [id number] Patient Account Number: [id number] Date of Birth/Sex: Treating RN: 07-29-1940 (82 y.o. [name] Primary Care Provider: [name] Other Clinician: Referring Provider: Treating Provider/Extender: Florian Buff in Treatment: 9 Constitutional Well-nourished and well-hydrated in no acute distress. Respiratory normal breathing without difficulty. Psychiatric this patient is able to make decisions and demonstrates good insight into disease process. Alert and Oriented x 3. pleasant and cooperative. Notes Upon inspection patient's wound bed actually showed signs of good granulation epithelization at this point.  Fortunately I do not see any evidence of active infection locally nor systemically which is great news and in general I do believe that we are moving in the right direction here. Electronic Signature(s) Signed: 09/09/2022 12:44:04 PM By: Allen Derry PA-C Entered By: Allen Derry on 09/09/2022 12:44:04 -------------------------------------------------------------------------------- Physician Orders Details Patient Name: Date of Service: Carol Kent 09/09/2022 12:00 PM Medical Record Number: [id number] Patient Account Number: [id number] Date of Birth/Sex: Treating RN: 12/19/1940 (82 y.o. 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 Home Health Home Health Company: - [facility] ADMIT to Home Health for wound care. May utilize formulary equivalent dressing for wound treatment orders unless otherwise specified. Home Health Nurse may visit PRN to address patients wound care needs. CONTINUE Home Health for wound care. May utilize formulary equivalent dressing for wound treatment orders unless otherwise specified. Home Health Nurse may visit PRN to address patients wound care needs. Scheduled days for dressing changes to  be completed; exception, patient has scheduled wound care visit that day. **Please direct any NON-WOUND related issues/requests for orders to patient's Primary Care Physician. **If current dressing  causes regression in wound condition, may D/C ordered dressing product/s and apply Normal Saline Moist Dressing daily until next Wound Healing Center or Other MD appointment. **Notify Wound Healing Center of regression in wound condition at [phone]. Other Home Health Orders/Instructions: - for tubi grip size F , please place on legs just above toes and 2 fingers above knee for swelling. Please place ace wrap to left knee. Bathing/ Shower/ Hygiene May shower; gently cleanse wound with antibacterial soap, rinse and pat dry prior to dressing wounds - using dial soap No tub bath. Anesthetic (Use 'Patient Medications' Section for Anesthetic Order Entry) Lidocaine applied to wound bed Carol, Kent ([id number]) [id number] Page 3 of 6 Edema Control - Lymphedema / Segmental Compressive Device / Other Tubigrip single layer applied. - size F left lower leg Wound Treatment Wound #7 - Knee Wound Laterality: Left Cleanser: Soap and Water Every Other Day/30 Days Discharge Instructions: Gently cleanse wound with antibacterial soap, rinse and pat dry prior to dressing wounds Prim Dressing: Xeroform 5x9-HBD (in/in) Every Other Day/30 Days ary Discharge Instructions: Apply Xeroform 5x9-HBD (in/in) as directed Double layer Secondary Dressing: ABD Pad 5x9 (in/in) Every Other Day/30 Days Discharge Instructions: Cover with ABD pad Secured With: Medipore T - 48M Medipore H Soft Cloth Surgical T ape ape, 2x2 (in/yd) Every Other Day/30 Days Secured With: Conform 4'' - Conforming Stretch Gauze Bandage 4x75 (in/in) Every Other Day/30 Days Discharge Instructions: Apply as directed Secured With: Tubigrip Size F, 4x10 (in/yd) Every Other Day/30 Days Discharge Instructions: Apply Tubigrip  F 3 finger-widths above knee and base of toes to secure dressing and/or for swelling. Single layer Electronic Signature(s) Signed: 09/09/2022 4:39:57 PM By: Angelina Pih Signed: 09/09/2022 6:26:43 PM By: Allen Derry PA-C Entered By: Angelina Pih on 09/09/2022 12:49:32 -------------------------------------------------------------------------------- Problem List Details Patient Name: Date of Service: Carol Kent. 09/09/2022 12:00 PM Medical Record Number: [id number] Patient Account Number: [id number] Date of Birth/Sex: Treating RN: [birth year] (82 y.o. [name] Primary Care Provider: [name] Other Clinician: Referring Provider: Treating Provider/Extender: Florian Buff in Treatment: 9 Active Problems ICD-10 Encounter Code Description Active Date MDM Diagnosis S81.811A Laceration without foreign body, right lower leg, initial encounter 07/07/2022 No Yes L97.812 Non-pressure chronic ulcer of other part of right lower leg with fat layer 07/07/2022 No Yes exposed S81.012A Laceration without foreign body, left knee, initial encounter 09/02/2022 No Yes N18.4 Chronic kidney disease, stage 4 (severe) 07/07/2022 No Yes I50.42 Chronic combined systolic (congestive) and diastolic (congestive) heart failure 07/07/2022 No Yes 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 right 07/07/2022 No Yes lower extremity I73.89 Other specified peripheral vascular diseases 07/07/2022 No Yes Carol Kent, Carol Kent ([id number]) [id number] Page 4 of 6 Inactive Problems Resolved Problems Electronic Signature(s) Signed: 09/09/2022 12:32:06 PM By: Allen Derry PA-C Entered By: Allen Derry on 09/09/2022 12:32:05 -------------------------------------------------------------------------------- Progress Note Details Patient Name: Date of Service: Carol Kent. 09/09/2022 12:00 PM Medical Record Number: [id number] Patient  Account Number: [id number] Date of Birth/Sex: Treating RN: November 11, 1940 (82 y.o. [name] Primary Care Provider: [name] Other Clinician: Referring Provider: Treating Provider/Extender: Florian Buff in Treatment: 9 Subjective Chief Complaint Information obtained from Patient Right LE Ulcers/Skin Tears and Left Knee Skin Tear History of Present Illness (HPI) 07-07-2022 upon evaluation today patient appears to be doing somewhat poorly in regard to wounds that actually began in the right leg as a result of trauma. She tells me that she has been having  some discomfort it is not hurting too bad however. She was originally placed on Keflex but she is finished this. She did not go to the ER to have this checked out for 3 days after it initially happened so they were not able to Steri-Strip anything back down. She has been using bacitracin and Xeroform. She does have family members that have been helping her with dressing changes. With that being said she does not appear to have any signs of infection right now but nonetheless does have obviously the wounds that need to be addressed. Patient does have a history of chronic kidney disease stage IV, congestive heart failure, lower extremity venous insufficiency and swelling although this is not too significant, and peripheral vascular disease as ABIs are noncompressible. She does seem to have good blood flow however. Her capillary refill was good and the wounds themselves do seem to be bleeding even with light cleaning. 07-14-2022 upon evaluation today patient appears to be doing well currently in regard to her wounds. She is actually showing signs of improvement which is good news. Fortunately there does not appear to be any signs of active infection locally nor systemically which is great news. No fevers, chills, nausea, vomiting, or diarrhea. 07-28-2022 upon evaluation today patient appears to be doing well currently in  regard to her wounds which are actually measuring smaller this is great news. She does have a couple of blisters around the knee from where she bumped it but were not really sure exactly what she bumped it on. Nonetheless I am going to likely help to manage that as well now and see if we can prevent this from opening and turning into a wound. 08-07-2022 upon evaluation today patient appears to be doing better in regard to her right leg with original wound although she does have 1 that is open just below the original at the knee. This is where she had some of the blood blisters formed. Fortunately there does not appear to be any signs of active infection locally nor systemically. Unfortunately she still has a tremendous amount of swelling she does see cardiology at the end of the month but she runs out of her fluid pills tomorrow. They are going to see if they can get a refill until she can get in to be seen. 08-15-2022 upon evaluation today patient appears to be doing well currently in general in regard to her wounds. Everything is showing signs of improvement except for the newer wounds which do improve still have been on a slower path obviously due to just the nature of the wounds not being nearly as old. Fortunately there does not appear to be any signs of active infection locally nor systemically at this point. 09-02-2022 upon evaluation today patient appears to be doing excellent in regard to her right leg which is showing signs of improvement unfortunately left leg she has a new skin tear from a new fall. I am not sure how certain we will be able to approximate this especially being at the knee is going to be very tricky. 09-09-2022 upon evaluation today patient appears to be doing well currently in regard to her wound. In fact this is showing signs of improvement not everything tacked back down but we were able to get some of the skin to reapproximate the more clearly. Fortunately I do not see any  signs of infection. Objective Constitutional Well-nourished and well-hydrated in no acute distress. Vitals Time Taken: 12:09 PM, Height: 62 in, Weight: 150  lbs, BMI: 27.4, Temperature: 97.7 F, Pulse: 82 bpm, Respiratory Rate: 18 breaths/min, Blood Pressure: 109/49 mmHg. Respiratory normal breathing without difficulty. Carol Kent, Carol Kent ([id number]) [id number] Page 5 of 6 Psychiatric this patient is able to make decisions and demonstrates good insight into disease process. Alert and Oriented x 3. pleasant and cooperative. General Notes: Upon inspection patient's wound bed actually showed signs of good granulation epithelization at this point. Fortunately I do not see any evidence of active infection locally nor systemically which is great news and in general I do believe that we are moving in the right direction here. Integumentary (Hair, Skin) Wound #5 status is Healed - Epithelialized. Original cause of wound was Blister. The date acquired was: 08/12/2022. The wound has been in treatment 3 weeks. The wound is located on the Right,Medial Knee. The wound measures 0cm length x 0cm width x 0cm depth; 0cm^2 area and 0cm^3 volume. There is no tunneling or undermining noted. There is a none present amount of drainage noted. There is no granulation within the wound bed. There is no necrotic tissue within the wound bed. Wound #7 status is Open. Original cause of wound was Skin T ear/Laceration. The date acquired was: 08/30/2022. The wound has been in treatment 1 weeks. The wound is located on the Left Knee. The wound measures 5.6cm length x 6cm width x 0.1cm depth; 26.389cm^2 area and 2.639cm^3 volume. There is Fat Layer (Subcutaneous Tissue) exposed. There is no tunneling or undermining noted. There is a medium amount of serosanguineous drainage noted. There is medium (34-66%) red, pink granulation within the wound bed. There is a medium (34-66%) amount of necrotic tissue  within the wound bed including Adherent Slough. 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 Home Health: Home Health Company: - [facility] ADMIT to Home Health for wound care. May utilize formulary equivalent dressing for wound treatment orders unless otherwise specified. Home Health Nurse may visit PRN to address patients wound care needs. CONTINUE Home Health for wound care. May utilize formulary equivalent dressing for wound treatment orders unless otherwise specified. Home Health Nurse may visit PRN to address patients wound care needs. Scheduled days for dressing changes to be completed; exception, patient has scheduled wound care visit that day. **Please direct any NON-WOUND related issues/requests for orders to patient's Primary Care Physician. **If current dressing causes regression in wound condition, may D/C ordered dressing product/s and apply Normal Saline Moist Dressing daily until next Wound Healing Center or Other MD appointment. **Notify Wound Healing Center of regression in wound condition at [phone]. Other Home Health Orders/Instructions: - for tubi grip size, please place on legs just above toes and 2 fingers above knee for swelling Bathing/ Shower/ Hygiene: May shower; gently cleanse wound with antibacterial soap, rinse and pat dry prior to dressing wounds - using dial soap No tub bath. Anesthetic (Use 'Patient Medications' Section for Anesthetic Order  Entry): Lidocaine applied to wound bed Edema Control - Lymphedema / Segmental Compressive Device / Other: Tubigrip single layer applied. - size F left lower leg WOUND #7: - Knee Wound Laterality: Left Cleanser: Soap and Water Every Other  Day/30 Days Discharge Instructions: Gently cleanse wound with antibacterial soap, rinse and pat dry prior to dressing wounds Prim Dressing: Xeroform 5x9-HBD (in/in) Every Other Day/30 Days ary Discharge Instructions: Apply Xeroform 5x9-HBD (in/in) as directed Double layer Secondary Dressing: ABD Pad 5x9 (in/in) Every Other Day/30 Days Discharge Instructions: Cover with ABD pad Secured With: Medipore T - 18M Medipore H Soft Cloth Surgical T ape ape, 2x2 (in/yd) Every Other Day/30 Days Secured With: Conform 4'' - Conforming Stretch Gauze Bandage 4x75 (in/in) Every Other Day/30 Days Discharge Instructions: Apply as directed Secured With: Tubigrip Size F, 4x10 (in/yd) Every Other Day/30 Days Discharge Instructions: Apply Tubigrip F 3 finger-widths above knee and base of toes to secure dressing and/or for swelling. Single layer 1. I would recommend that we have the patient continue to monitor for any signs of infection or worsening. Based on what I am seeing I do believe that we are making good headway towards complete closure. 2. I am then recommend as well that the patient should continue to monitor for any evidence of infection or worsening. Based on what I am seeing I do believe that we are making good headway here. We will see patient back for reevaluation in 1 week here in the clinic. If anything worsens or changes patient will contact our office for additional recommendations. Electronic Signature(s) Signed: 09/09/2022 12:45:09 PM By: Loni Muse, Claretta Fraise ([id number]) [id number] Page 6 of 6 Entered By: Allen Derry on 09/09/2022  12:45:09 -------------------------------------------------------------------------------- SuperBill Details Patient Name: Date of Service: Carol Kent, Carol Kent 09/09/2022 Medical Record Number: [id number] Patient Account Number: [id number] Date of Birth/Sex: Treating RN: [birth year] (82 y.o. 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 I73.89 Other specified peripheral vascular diseases Facility Procedures : CPT4 Code: [id number] Description: 99213 - WOUND CARE VISIT-LEV 3 EST PT Modifier: Quantity: 1 Physician Procedures : CPT4 Code Description Modifier [id number] 99213 - WC PHYS LEVEL 3 - EST PT ICD-10 Diagnosis Description S81.811A Laceration without foreign body, right lower leg, initial encounter L97.812 Non-pressure chronic ulcer of other part of right lower leg with  fat layer exposed S81.012A Laceration without foreign body, left knee, initial encounter N18.4 Chronic kidney disease, stage 4 (severe) Quantity: 1 Electronic Signature(s) Signed: 09/09/2022 12:45:24 PM By: Allen Derry PA-C Entered By: Allen Derry on 09/09/2022 12:45:23

## 2022-09-10 NOTE — Progress Notes (Signed)
Carol Kent ([id number]) [id number] Page 1 of 9 Visit Report for 09/09/2022 Arrival Information Details Patient Name: Date of Service: Carol Kent, Carol Kent 09/09/2022 12:00 PM Medical Record Number: [id number] Patient Account Number: [id number] Date of Birth/Sex: Treating RN: Sep 05, 1940 (82 y.o. Carol Kent Primary Care Breyton Vanscyoc: [name] Other Clinician: Referring Mirl Hillery: Treating Sanela Evola/Extender: Florian Buff in Treatment: 9 Visit Information History Since Last Visit Added or deleted any medications: No Patient Arrived: Wheel Chair Any new allergies or adverse reactions: No Arrival Time: 12:08 Had a fall or experienced change in No Accompanied By: daughter activities of daily living that may affect Transfer Assistance: EasyPivot Patient Lift risk of falls: Patient Identification Verified: Yes Hospitalized since last visit: No Secondary Verification Process Completed: Yes Has Dressing in Place as Prescribed: Yes Patient Has Alerts: Yes Has Compression in Place as Prescribed: Yes Patient Alerts: ABI non compressible Pain Present Now: No Electronic Signature(s) Signed: 09/09/2022 4:39:57 PM By: Angelina Pih Entered By: Angelina Pih on 09/09/2022 12:09:12 -------------------------------------------------------------------------------- Clinic Level of Care Assessment Details Patient Name: Date of Service: Carol Kent, Carol Kent 09/09/2022 12:00 PM Medical Record Number: [id number] Patient Account Number: [id number] Date of Birth/Sex: Treating RN: 01-31-1941 (82 y.o. Carol Kent Primary Care Rennie Hack: [name] Other Clinician: Referring Morgin Halls: Treating Jewelia Bocchino/Extender: Florian Buff in Treatment: 9 Clinic Level of Care Assessment Items TOOL 4 Quantity Score []  - 0 Use when only an EandM is performed on FOLLOW-UP visit ASSESSMENTS - Nursing Assessment / Reassessment X- 1  10 Reassessment of Co-morbidities (includes updates in patient status) X- 1 5 Reassessment of Adherence to Treatment Plan ASSESSMENTS - Wound and Skin A ssessment / Reassessment X - Simple Wound Assessment / Reassessment - one wound 1 5 []  - 0 Complex Wound Assessment / Reassessment - multiple wounds []  - 0 Dermatologic / Skin Assessment (not related to wound area) ASSESSMENTS - Focused Assessment X- 1 5 Circumferential Edema Measurements - multi extremities []  - 0 Nutritional Assessment / Counseling / Intervention []  - 0 Lower Extremity Assessment (monofilament, tuning fork, pulses) []  - 0 Peripheral Arterial Disease Assessment (using hand held doppler) ASSESSMENTS - Ostomy and/or Continence Assessment and Care []  - 0 Incontinence Assessment and Management []  - 0 Ostomy Care Assessment and Management (repouching, etc.) PROCESS - Coordination of Care X - Simple Patient / Family Education for ongoing care 1 15 []  - 0 Complex (extensive) Patient / Family Education for ongoing care Carol Kent ([id number]) [id number] Page 2 of 9 X- 1 10 Staff obtains Consents, Records, T Results / Process Orders est []  - 0 Staff telephones HHA, Nursing Homes / Clarify orders / etc []  - 0 Routine Transfer to another Facility (non-emergent condition) []  - 0 Routine Hospital Admission (non-emergent condition) []  - 0 New Admissions / Manufacturing engineer / Ordering NPWT Apligraf, etc. , []  - 0 Emergency Hospital Admission (emergent condition) X- 1 10 Simple Discharge Coordination []  - 0 Complex (extensive) Discharge Coordination PROCESS - Special Needs []  - 0 Pediatric / Minor Patient Management []  - 0 Isolation Patient Management []  - 0 Hearing / Language / Visual special needs []  - 0 Assessment of Community assistance (transportation, D/C planning, etc.) []  - 0 Additional assistance / Altered mentation []  - 0 Support Surface(s) Assessment (bed,  cushion, seat, etc.) INTERVENTIONS - Wound Cleansing / Measurement X - Simple Wound Cleansing - one wound 1 5 []  - 0 Complex Wound Cleansing - multiple wounds X- 1 5 Wound Imaging (photographs -  any number of wounds) []  - 0 Wound Tracing (instead of photographs) X- 1 5 Simple Wound Measurement - one wound []  - 0 Complex Wound Measurement - multiple wounds INTERVENTIONS - Wound Dressings X - Small Wound Dressing one or multiple wounds 1 10 []  - 0 Medium Wound Dressing one or multiple wounds []  - 0 Large Wound Dressing one or multiple wounds X- 1 5 Application of Medications - topical []  - 0 Application of Medications - injection INTERVENTIONS - Miscellaneous []  - 0 External ear exam []  - 0 Specimen Collection (cultures, biopsies, blood, body fluids, etc.) []  - 0 Specimen(s) / Culture(s) sent or taken to Lab for analysis []  - 0 Patient Transfer (multiple staff / Nurse, adult / Similar devices) []  - 0 Simple Staple / Suture removal (25 or less) []  - 0 Complex Staple / Suture removal (26 or more) []  - 0 Hypo / Hyperglycemic Management (close monitor of Blood Glucose) []  - 0 Ankle / Brachial Index (ABI) - do not check if billed separately X- 1 5 Vital Signs Has the patient been seen at the hospital within the last three years: Yes Total Score: 95 Level Of Care: New/Established - Level 3 Electronic Signature(s) Signed: 09/09/2022 4:39:57 PM By: Angelina Pih Entered By: Angelina Pih on 09/09/2022 12:36:23 Carol Kent ([id number]) [id number] Page 3 of 9 -------------------------------------------------------------------------------- Encounter Discharge Information Details Patient Name: Date of Service: Carol Kent, Carol Kent 09/09/2022 12:00 PM Medical Record Number: [id number] Patient Account Number: [id number] Date of Birth/Sex: Treating RN: February 01, 1941 (82 y.o. Carol Kent Primary Care Judge Duque: [name] Other  Clinician: Referring Resa Rinks: Treating Ashunti Schofield/Extender: Florian Buff in Treatment: 9 Encounter Discharge Information Items Discharge Condition: Stable Ambulatory Status: Wheelchair Discharge Destination: Home Transportation: Private Auto Accompanied By: daughter Schedule Follow-up Appointment: Yes Clinical Summary of Care: Electronic Signature(s) Signed: 09/09/2022 4:39:57 PM By: Angelina Pih Entered By: Angelina Pih on 09/09/2022 12:38:22 -------------------------------------------------------------------------------- Lower Extremity Assessment Details Patient Name: Date of Service: Carol Kent, Carol Kent 09/09/2022 12:00 PM Medical Record Number: [id number] Patient Account Number: [id number] Date of Birth/Sex: Treating RN: 07/21/1940 (82 y.o. Carol Kent Primary Care Gaetan Spieker: [name] Other Clinician: Referring Evania Lyne: Treating Jakye Mullens/Extender: Florian Buff in Treatment: 9 Edema Assessment Assessed: [Left: No] [Right: No] [Left: Edema] [Right: :] Calf Left: Right: Point of Measurement: 33 cm From Medial Instep 36 cm 34 cm Ankle Left: Right: Point of Measurement: 10 cm From Medial Instep 27.5 cm 25.6 cm Vascular Assessment Pulses: Dorsalis Pedis Palpable: [Left:Yes] [Right:Yes] Electronic Signature(s) Signed: 09/09/2022 4:39:57 PM By: Angelina Pih Entered By: Angelina Pih on 09/09/2022 12:22:18 -------------------------------------------------------------------------------- Multi Wound Chart Details Patient Name: Date of Service: Carol Kent. 09/09/2022 12:00 PM Medical Record Number: [id number] Patient Account Number: [id number] Date of Birth/Sex: Treating RN: [birth year] (82 y.o. Carol Kent Primary Care Jabria Loos: [name] Other Clinician: Referring Mercede Rollo: Treating Juliyah Mergen/Extender: Florian Buff in Treatment: [address] CHELLE, PEARL ([id number])  [id number] Page 4 of 9 Height(in): 62 Pulse(bpm): 82 Weight(lbs): 150 Blood Pressure(mmHg): 109/49 Body Mass Index(BMI): 27.4 Temperature(F): 97.7 Respiratory Rate(breaths/min): 18 [5:Photos:] [N/A:N/A] Right, Medial Knee Left Knee N/A Wound Location: Blister Skin T ear/Laceration N/A Wounding Event: Skin T ear Skin T ear N/A Primary Etiology: Cataracts, Anemia, Congestive Heart Cataracts, Anemia, Congestive Heart N/A Comorbid History: Failure, Rheumatoid Arthritis Failure, Rheumatoid Arthritis 08/12/2022 08/30/2022 N/A Date Acquired: 3 1 N/A Weeks of Treatment: Healed - Epithelialized Open N/A Wound Status: No No N/A Wound Recurrence:  0x0x0 5.6x6x0.1 N/A Measurements L x W x D (cm) 0 26.389 N/A A (cm) : rea 0 2.639 N/A Volume (cm) : 100.00% 4.30% N/A % Reduction in Area: 100.00% 4.30% N/A % Reduction in Volume: Partial Thickness Full Thickness Without Exposed N/A Classification: Support Structures None Present Medium N/A Exudate Amount: N/A Serosanguineous N/A Exudate Type: N/A red, brown N/A Exudate Color: None Present (0%) Medium (34-66%) N/A Granulation Amount: N/A Red, Pink N/A Granulation Quality: None Present (0%) Medium (34-66%) N/A Necrotic Amount: Fascia: No Fat Layer (Subcutaneous Tissue): Yes N/A Exposed Structures: Fat Layer (Subcutaneous Tissue): No Fascia: No Tendon: No Tendon: No Muscle: No Muscle: No Joint: No Joint: No Bone: No Bone: No Large (67-100%) Small (1-33%) N/A Epithelialization: Treatment Notes Electronic Signature(s) Signed: 09/09/2022 4:39:57 PM By: Angelina Pih Entered By: Angelina Pih on 09/09/2022 12:33:59 -------------------------------------------------------------------------------- Multi-Disciplinary Care Plan Details Patient Name: Date of Service: Carol Sill C. 09/09/2022 12:00 PM Medical Record Number: [id number] Patient Account Number: [id number] Date of Birth/Sex:  Treating RN: 06-03-1940 (82 y.o. Carol Kent Primary Care Carlotta Telfair: [name] Other Clinician: Referring Authur Cubit: Treating Pranathi Winfree/Extender: Florian Buff in Treatment: 9 Active Inactive Abuse / Safety / Falls / Self Care Management Nursing Diagnoses: History of Falls Impaired physical mobility Potential for falls Potential for injury related to falls Carol Kent, Carol Kent ([id number]) [id number] Page 5 of 9 Goals: Patient will not develop complications from immobility Date Initiated: 07/07/2022 Date Inactivated: 08/07/2022 Target Resolution Date: 08/04/2022 Goal Status: Met Patient will not experience any injury related to falls Date Initiated: 07/07/2022 Date Inactivated: 08/07/2022 Target Resolution Date: 08/04/2022 Goal Status: Met Patient will remain injury free related to falls Date Initiated: 07/07/2022 Target Resolution Date: 09/23/2022 Goal Status: Active Patient/caregiver will demonstrate safe use of adaptive devices to increase mobility Date Initiated: 07/07/2022 Target Resolution Date: 09/23/2022 Goal Status: Active Patient/caregiver will verbalize understanding of skin care regimen Date Initiated: 07/07/2022 Date Inactivated: 07/07/2022 Target Resolution Date: 07/07/2022 Goal Status: Met Patient/caregiver will verbalize/demonstrate measures taken to improve the patient's personal safety Date Initiated: 07/07/2022 Target Resolution Date: 09/23/2022 Goal Status: Active Patient/caregiver will verbalize/demonstrate measures taken to prevent injury and/or falls Date Initiated: 07/07/2022 Target Resolution Date: 09/23/2022 Goal Status: Active Interventions: Assess Activities of Daily Living upon admission and as needed Assess fall risk on admission and as needed Assess impairment of mobility on admission and as needed per policy Assess personal safety and home safety (as indicated) on admission and as needed Provide education on fall  prevention Provide education on personal and home safety Provide education on safe transfers Notes: Wound/Skin Impairment Nursing Diagnoses: Impaired tissue integrity Knowledge deficit related to ulceration/compromised skin integrity Goals: Ulcer/skin breakdown will have a volume reduction of 30% by week 4 Date Initiated: 07/07/2022 Date Inactivated: 09/09/2022 Target Resolution Date: 08/04/2022 Goal Status: Unmet Unmet Reason: comorbidities Ulcer/skin breakdown will have a volume reduction of 50% by week 8 Date Initiated: 07/07/2022 Date Inactivated: 09/09/2022 Target Resolution Date: 09/01/2022 Goal Status: Unmet Unmet Reason: comorbidities Ulcer/skin breakdown will have a volume reduction of 80% by week 12 Date Initiated: 07/07/2022 Target Resolution Date: 09/29/2022 Goal Status: Active Ulcer/skin breakdown will heal within 14 weeks Date Initiated: 07/07/2022 Target Resolution Date: 10/13/2022 Goal Status: Active Interventions: Assess patient/caregiver ability to obtain necessary supplies Assess patient/caregiver ability to perform ulcer/skin care regimen upon admission and as needed Assess ulceration(s) every visit Provide education on ulcer and skin care Treatment Activities: Skin care regimen initiated : 07/07/2022 Notes: Electronic Signature(s) Signed: 09/09/2022 4:39:57 PM By:  Angelina Pih Entered By: Angelina Pih on 09/09/2022 12:37:36 Pain Assessment Details -------------------------------------------------------------------------------- Carol Kent ([id number]) [id number] Page 6 of 9 Patient Name: Date of Service: Carol Kent, Carol Kent 09/09/2022 12:00 PM Medical Record Number: [id number] Patient Account Number: [id number] Date of Birth/Sex: Treating RN: 1941/03/10 (82 y.o. Carol Kent Primary Care Dustyn Armbrister: [name] Other Clinician: Referring Shereta Crothers: Treating Hanifah Royse/Extender: Florian Buff in Treatment:  9 Active Problems Location of Pain Severity and Description of Pain Patient Has Paino No Site Locations Rate the pain. Current Pain Level: 0 Pain Management and Medication Current Pain Management: Electronic Signature(s) Signed: 09/09/2022 4:39:57 PM By: Angelina Pih Entered By: Angelina Pih on 09/09/2022 12:09:31 -------------------------------------------------------------------------------- Patient/Caregiver Education Details Patient Name: Date of Service: Carol Kent 6/11/2024andnbsp12:00 PM Medical Record Number: [id number] Patient Account Number: [id number] Date of Birth/Gender: Treating RN: 03/13/1941 (82 y.o. Carol Kent Primary Care Physician: [name] Other Clinician: Referring Physician: Treating Physician/Extender: Florian Buff in Treatment: 9 Education Assessment Education Provided To: Patient Education Topics Provided Wound/Skin Impairment: Handouts: Caring for Your Ulcer Methods: Explain/Verbal Responses: State content correctly Electronic Signature(s) Signed: 09/09/2022 4:39:57 PM By: Angelina Pih Entered By: Angelina Pih on 09/09/2022 12:37:47 -------------------------------------------------------------------------------- Wound Assessment Details Patient Name: Date of Service: Carol Kent 09/09/2022 12:00 PM Carol Kent ([id number]) [id number] Page 7 of 9 Medical Record Number: [id number] Patient Account Number: [id number] Date of Birth/Sex: Treating RN: Apr 05, 1940 (82 y.o. Carol Kent Primary Care Zymeir Salminen: [name] Other Clinician: Referring Misheel Gowans: Treating Brayla Pat/Extender: Florian Buff in Treatment: 9 Wound Status Wound Number: 5 Primary Skin Tear Etiology: Wound Location: Right, Medial Knee Wound Status: Healed - Epithelialized Wounding Event: Blister Comorbid Cataracts, Anemia, Congestive Heart Failure,  Rheumatoid Date Acquired: 08/12/2022 History: Arthritis Weeks Of Treatment: 3 Clustered Wound: No Photos Wound Measurements Length: (cm) Width: (cm) Depth: (cm) Area: (cm) Volume: (cm) 0 % Reduction in Area: 100% 0 % Reduction in Volume: 100% 0 Epithelialization: Large (67-100%) 0 Tunneling: No 0 Undermining: No Wound Description Classification: Partial Thickness Exudate Amount: None Present Foul Odor After Cleansing: No Slough/Fibrino No Wound Bed Granulation Amount: None Present (0%) Exposed Structure Necrotic Amount: None Present (0%) Fascia Exposed: No Fat Layer (Subcutaneous Tissue) Exposed: No Tendon Exposed: No Muscle Exposed: No Joint Exposed: No Bone Exposed: No Electronic Signature(s) Signed: 09/09/2022 4:39:57 PM By: Angelina Pih Entered By: Angelina Pih on 09/09/2022 12:33:45 -------------------------------------------------------------------------------- Wound Assessment Details Patient Name: Date of Service: Carol Kent 09/09/2022 12:00 PM Medical Record Number: [id number] Patient Account Number: [id number] Date of Birth/Sex: Treating RN: 1941-02-01 (82 y.o. Carol Kent Primary Care Shakirra Buehler: [name] Other Clinician: Referring Brandilynn Taormina: Treating Gay Rape/Extender: Florian Buff in Treatment: 9 Wound Status Wound Number: 7 Primary Skin Tear Etiology: Wound Location: Left Knee Wound Status: Open Wounding Event: Skin Tear/Laceration Comorbid Cataracts, Anemia, Congestive Heart Failure, Rheumatoid Date Acquired: 08/30/2022 History: Arthritis Weeks Of Treatment: 1 Clustered Wound: No Carol Kent, Carol Kent ([id number]) [id number] Page 8 of 9 Photos Wound Measurements Length: (cm) 5.6 Width: (cm) 6 Depth: (cm) 0.1 Area: (cm) 26.389 Volume: (cm) 2.639 % Reduction in Area: 4.3% % Reduction in Volume: 4.3% Epithelialization: Small (1-33%) Tunneling: No Undermining: No Wound  Description Classification: Full Thickness Without Exposed Support Structures Exudate Amount: Medium Exudate Type: Serosanguineous Exudate Color: red, brown Foul Odor After Cleansing: No Slough/Fibrino Yes Wound Bed Granulation Amount: Medium (34-66%) Exposed Structure Granulation Quality: Red, Pink Fascia Exposed: No Necrotic Amount: Medium (34-66%)  Fat Layer (Subcutaneous Tissue) Exposed: Yes Necrotic Quality: Adherent Slough Tendon Exposed: No Muscle Exposed: No Joint Exposed: No Bone Exposed: No Treatment Notes Wound #7 (Knee) Wound Laterality: Left Cleanser Soap and Water Discharge Instruction: Gently cleanse wound with antibacterial soap, rinse and pat dry prior to dressing wounds Peri-Wound Care Topical Primary Dressing Xeroform 5x9-HBD (in/in) Discharge Instruction: Apply Xeroform 5x9-HBD (in/in) as directed Double layer Secondary Dressing ABD Pad 5x9 (in/in) Discharge Instruction: Cover with ABD pad Secured With Medipore T - 34M Medipore H Soft Cloth Surgical T ape ape, 2x2 (in/yd) Conform 4'' - Conforming Stretch Gauze Bandage 4x75 (in/in) Discharge Instruction: Apply as directed Tubigrip Size F, 4x10 (in/yd) Discharge Instruction: Apply Tubigrip F 3 finger-widths above knee and base of toes to secure dressing and/or for swelling. Single layer Compression Wrap Compression Stockings Add-Ons Electronic Signature(s) Signed: 09/09/2022 4:39:57 PM By: Angelina Pih Entered By: Angelina Pih on 09/09/2022 12:21:19 Carol Kent ([id number]) [id number] Page 9 of 9 -------------------------------------------------------------------------------- Vitals Details Patient Name: Date of Service: Carol Kent, Carol Kent 09/09/2022 12:00 PM Medical Record Number: [id number] Patient Account Number: [id number] Date of Birth/Sex: Treating RN: 04-01-1940 (82 y.o. Carol Kent Primary Care Enrrique Mierzwa: [name] Other Clinician: Referring  Mittie Knittel: Treating Arneda Sappington/Extender: Florian Buff in Treatment: 9 Vital Signs Time Taken: 12:09 Temperature (F): 97.7 Height (in): 62 Pulse (bpm): 82 Weight (lbs): 150 Respiratory Rate (breaths/min): 18 Body Mass Index (BMI): 27.4 Blood Pressure (mmHg): 109/49 Reference Range: 80 - 120 mg / dl Electronic Signature(s) Signed: 09/09/2022 4:39:57 PM By: Angelina Pih Entered By: Angelina Pih on 09/09/2022 12:09:25

## 2022-09-15 ENCOUNTER — Telehealth: Payer: Self-pay

## 2022-09-15 NOTE — Telephone Encounter (Signed)
2nd attempt:   Outreach to patients daughter to schedule initial PC consult. No answer and unable to LVM.   outreach to patient home number to schedule initial PC consult. No answer and unable to LVM,

## 2022-09-18 ENCOUNTER — Telehealth: Payer: Self-pay

## 2022-09-18 ENCOUNTER — Encounter: Payer: Medicare Other | Admitting: Physician Assistant

## 2022-09-18 DIAGNOSIS — I87331 Chronic venous hypertension (idiopathic) with ulcer and inflammation of right lower extremity: Secondary | ICD-10-CM | POA: Diagnosis not present

## 2022-09-18 NOTE — Progress Notes (Addendum)
Carol Kent, Carol Kent ([id number]) [id number] Page 1 of 9 Visit Report for 09/18/2022 Arrival Information Details Patient Name: Date of Service: Carol Kent 09/18/2022 10:30 A M Medical Record Number: [id number] Patient Account Number: [id number] Date of Birth/Sex: Treating RN: 1941-01-17 (82 y.o. [name] Primary Care Addaline Peplinski: [name] Other Clinician: Referring Breklyn Fabrizio: Treating Adalbert Alberto/Extender: Florian Buff in Treatment: 10 Visit Information History Since Last Visit Added or deleted any medications: No Patient Arrived: Dan Humphreys Any new allergies or adverse reactions: No Arrival Time: 10:43 Signs or symptoms of abuse/neglect since last visito No Accompanied By: family Has Dressing in Place as Prescribed: Yes Transfer Assistance: EasyPivot Patient Lift Has Compression in Place as Prescribed: Yes Patient Identification Verified: Yes Pain Present Now: No Secondary Verification Process Completed: Yes Patient Has Alerts: Yes Patient Alerts: ABI non compressible Electronic Signature(s) Signed: 09/18/2022 4:16:40 PM By: Angelina Pih Entered By: Angelina Pih on 09/18/2022 10:44:13 -------------------------------------------------------------------------------- Clinic Level of Care Assessment Details Patient Name: Date of Service: Carol Kent, Carol Kent 09/18/2022 10:30 A M Medical Record Number: [id number] Patient Account Number: [id number] Date of Birth/Sex: Treating RN: 1940-06-07 (82 y.o. [name] Primary Care Dashauna Heymann: [name] Other Clinician: Referring Lehua Flores: Treating Raheen Capili/Extender: Florian Buff in Treatment: 10 Clinic Level of Care Assessment Items TOOL 4 Quantity Score []  - 0 Use when only an EandM is performed on FOLLOW-UP visit ASSESSMENTS - Nursing Assessment / Reassessment X- 1 10 Reassessment of Co-morbidities (includes updates in patient status) X- 1  5 Reassessment of Adherence to Treatment Plan ASSESSMENTS - Wound and Skin A ssessment / Reassessment X - Simple Wound Assessment / Reassessment - one wound 1 5 []  - 0 Complex Wound Assessment / Reassessment - multiple wounds Carol Kent ([id number]) [id number] Page 2 of 9 []  - 0 Dermatologic / Skin Assessment (not related to wound area) ASSESSMENTS - Focused Assessment X- 1 5 Circumferential Edema Measurements - multi extremities []  - 0 Nutritional Assessment / Counseling / Intervention []  - 0 Lower Extremity Assessment (monofilament, tuning fork, pulses) []  - 0 Peripheral Arterial Disease Assessment (using hand held doppler) ASSESSMENTS - Ostomy and/or Continence Assessment and Care []  - 0 Incontinence Assessment and Management []  - 0 Ostomy Care Assessment and Management (repouching, etc.) PROCESS - Coordination of Care X - Simple Patient / Family Education for ongoing care 1 15 []  - 0 Complex (extensive) Patient / Family Education for ongoing care X- 1 10 Staff obtains Chiropractor, Records, T Results / Process Orders est []  - 0 Staff telephones HHA, Nursing Homes / Clarify orders / etc []  - 0 Routine Transfer to another Facility (non-emergent condition) []  - 0 Routine Hospital Admission (non-emergent condition) []  - 0 New Admissions / Manufacturing engineer / Ordering NPWT Apligraf, etc. , []  - 0 Emergency Hospital Admission (emergent condition) X- 1 10 Simple Discharge Coordination []  - 0 Complex (extensive) Discharge Coordination PROCESS - Special Needs []  - 0 Pediatric / Minor Patient Management []  - 0 Isolation Patient Management []  - 0 Hearing / Language / Visual special needs []  - 0 Assessment of Community assistance (transportation, D/C planning, etc.) []  - 0 Additional assistance / Altered mentation []  - 0 Support Surface(s) Assessment (bed, cushion, seat, etc.) INTERVENTIONS - Wound Cleansing / Measurement X -  Simple Wound Cleansing - one wound 1 5 []  - 0 Complex Wound Cleansing - multiple wounds X- 1 5 Wound Imaging (photographs - any number of wounds) []  - 0 Wound Tracing (instead of photographs)  X- 1 5 Simple Wound Measurement - one wound []  - 0 Complex Wound Measurement - multiple wounds INTERVENTIONS - Wound Dressings X - Small Wound Dressing one or multiple wounds 1 10 []  - 0 Medium Wound Dressing one or multiple wounds []  - 0 Large Wound Dressing one or multiple wounds X- 1 5 Application of Medications - topical []  - 0 Application of Medications - injection INTERVENTIONS - Miscellaneous []  - 0 External ear exam []  - 0 Specimen Collection (cultures, biopsies, blood, body fluids, etc.) []  - 0 Specimen(s) / Culture(s) sent or taken to Lab for analysis Carol Kent, Carol Kent ([id number]) [id number] Page 3 of 9 []  - 0 Patient Transfer (multiple staff / Nurse, adult / Similar devices) []  - 0 Simple Staple / Suture removal (25 or less) []  - 0 Complex Staple / Suture removal (26 or more) []  - 0 Hypo / Hyperglycemic Management (close monitor of Blood Glucose) []  - 0 Ankle / Brachial Index (ABI) - do not check if billed separately X- 1 5 Vital Signs Has the patient been seen at the hospital within the last three years: Yes Total Score: 95 Level Of Care: New/Established - Level 3 Electronic Signature(s) Signed: 09/18/2022 4:16:40 PM By: Angelina Pih Entered By: Angelina Pih on 09/18/2022 11:09:40 -------------------------------------------------------------------------------- Encounter Discharge Information Details Patient Name: Date of Service: Carol Florence. 09/18/2022 10:30 A M Medical Record Number: [id number] Patient Account Number: [id number] Date of Birth/Sex: Treating RN: 01-Sep-1940 (82 y.o. [name] Primary Care Nessie Nong: [name] Other Clinician: Referring Zayne Marovich: Treating Haneen Bernales/Extender: Florian Buff in Treatment: 10 Encounter Discharge Information Items Discharge Condition: Stable Ambulatory Status: Wheelchair Discharge Destination: Home Transportation: Private Auto Accompanied By: family Schedule Follow-up Appointment: Yes Clinical Summary of Care: Electronic Signature(s) Signed: 09/18/2022 4:16:40 PM By: Angelina Pih Entered By: Angelina Pih on 09/18/2022 11:10:35 -------------------------------------------------------------------------------- Lower Extremity Assessment Details Patient Name: Date of Service: Carol Kent, Carol Kent 09/18/2022 10:30 A M Medical Record Number: [id number] Patient Account Number: [id number] Date of Birth/Sex: Treating RN: 1940-07-23 (82 y.o. [name] Primary Care Elijahjames Fuelling: [name] Other Clinician: Referring Macarthur Lorusso: Treating Anola Mcgough/Extender: Claudia Pollock Malcom, Claretta Fraise ([id number]) [id number] Page 4 of 9 Weeks in Treatment: 10 Edema Assessment Assessed: [Left: No] [Right: No] Edema: [Left: Yes] [Right: Yes] Calf Left: Right: Point of Measurement: 33 cm From Medial Instep 34.5 cm 34.8 cm Ankle Left: Right: Point of Measurement: 10 cm From Medial Instep 26.2 cm 26 cm Vascular Assessment Pulses: Dorsalis Pedis Palpable: [Left:Yes] [Right:Yes] Electronic Signature(s) Signed: 09/18/2022 4:16:40 PM By: Angelina Pih Entered By: Angelina Pih on 09/18/2022 10:45:45 -------------------------------------------------------------------------------- Multi Wound Chart Details Patient Name: Date of Service: Carol Florence 09/18/2022 10:30 A M Medical Record Number: [id number] Patient Account Number: [id number] Date of Birth/Sex: Treating RN: 01/27/1941 (82 y.o. [name] Primary Care Patina Spanier: [name] Other Clinician: Referring Timur Nibert: Treating Dela Sweeny/Extender: Florian Buff in Treatment: 10 Vital Signs Height(in):  62 Pulse(bpm): 96 Weight(lbs): 150 Blood Pressure(mmHg): 123/71 Body Mass Index(BMI): 27.4 Temperature(F): 97.3 Respiratory Rate(breaths/min): 18 [7:Photos:] [N/A:N/A] Left Knee N/A N/A Wound Location: Skin Tear/Laceration N/A N/A Wounding Event: Skin Tear N/A N/A Primary Etiology: Cataracts, Anemia, Congestive Heart N/A N/A Comorbid History: Failure, Rheumatoid Arthritis 08/30/2022 N/A N/A Date Acquired: 2 N/A N/A Weeks of TreatmentREBEKAH, Carol Kent ([id number]) [id number] Page 5 of 9 Open N/A N/A Wound Status: No N/A N/A Wound Recurrence: 3.5x4.2x0.1 N/A N/A Measurements L x W x D (cm) 11.545 N/A N/A  A (cm) : rea 1.155 N/A N/A Volume (cm) : 58.10% N/A N/A % Reduction in Area: 58.10% N/A N/A % Reduction in Volume: Full Thickness Without Exposed N/A N/A Classification: Support Structures Medium N/A N/A Exudate Amount: Serosanguineous N/A N/A Exudate Type: red, brown N/A N/A Exudate Color: Large (67-100%) N/A N/A Granulation Amount: Red, Pink N/A N/A Granulation Quality: Small (1-33%) N/A N/A Necrotic Amount: Fat Layer (Subcutaneous Tissue): Yes N/A N/A Exposed Structures: Fascia: No Tendon: No Muscle: No Joint: No Bone: No Small (1-33%) N/A N/A Epithelialization: Treatment Notes Electronic Signature(s) Signed: 09/18/2022 4:16:40 PM By: Angelina Pih Entered By: Angelina Pih on 09/18/2022 10:52:55 -------------------------------------------------------------------------------- Multi-Disciplinary Care Plan Details Patient Name: Date of Service: Carol Florence. 09/18/2022 10:30 A M Medical Record Number: [id number] Patient Account Number: [id number] Date of Birth/Sex: Treating RN: Oct 08, 1940 (82 y.o. [name] Primary Care Dennette Faulconer: [name] Other Clinician: Referring Kelseigh Diver: Treating Corbyn Wildey/Extender: Florian Buff in Treatment: 10 Active Inactive Abuse / Safety / Falls /  Self Care Management Nursing Diagnoses: History of Falls Impaired physical mobility Potential for falls Potential for injury related to falls Goals: Patient will not develop complications from immobility Date Initiated: 07/07/2022 Date Inactivated: 08/07/2022 Target Resolution Date: 08/04/2022 Goal Status: Met Patient will not experience any injury related to falls Date Initiated: 07/07/2022 Date Inactivated: 08/07/2022 Target Resolution Date: 08/04/2022 Goal Status: Met Patient will remain injury free related to falls Date Initiated: 07/07/2022 Target Resolution Date: 09/23/2022 Goal Status: Active Patient/caregiver will demonstrate safe use of adaptive devices to increase mobility Date Initiated: 07/07/2022 Target Resolution Date: 09/23/2022 Goal Status: Active Carol Kent, Carol Kent ([id number]) [phone].pdf Page 6 of 9 Patient/caregiver will verbalize understanding of skin care regimen Date Initiated: 07/07/2022 Date Inactivated: 07/07/2022 Target Resolution Date: 07/07/2022 Goal Status: Met Patient/caregiver will verbalize/demonstrate measures taken to improve the patient's personal safety Date Initiated: 07/07/2022 Target Resolution Date: 09/23/2022 Goal Status: Active Patient/caregiver will verbalize/demonstrate measures taken to prevent injury and/or falls Date Initiated: 07/07/2022 Target Resolution Date: 09/23/2022 Goal Status: Active Interventions: Assess Activities of Daily Living upon admission and as needed Assess fall risk on admission and as needed Assess impairment of mobility on admission and as needed per policy Assess personal safety and home safety (as indicated) on admission and as needed Provide education on fall prevention Provide education on personal and home safety Provide education on safe transfers Notes: Wound/Skin Impairment Nursing Diagnoses: Impaired tissue integrity Knowledge deficit related to ulceration/compromised skin  integrity Goals: Ulcer/skin breakdown will have a volume reduction of 30% by week 4 Date Initiated: 07/07/2022 Date Inactivated: 09/09/2022 Target Resolution Date: 08/04/2022 Goal Status: Unmet Unmet Reason: comorbidities Ulcer/skin breakdown will have a volume reduction of 50% by week 8 Date Initiated: 07/07/2022 Date Inactivated: 09/09/2022 Target Resolution Date: 09/01/2022 Goal Status: Unmet Unmet Reason: comorbidities Ulcer/skin breakdown will have a volume reduction of 80% by week 12 Date Initiated: 07/07/2022 Target Resolution Date: 09/29/2022 Goal Status: Active Ulcer/skin breakdown will heal within 14 weeks Date Initiated: 07/07/2022 Target Resolution Date: 10/13/2022 Goal Status: Active Interventions: Assess patient/caregiver ability to obtain necessary supplies Assess patient/caregiver ability to perform ulcer/skin care regimen upon admission and as needed Assess ulceration(s) every visit Provide education on ulcer and skin care Treatment Activities: Skin care regimen initiated : 07/07/2022 Notes: Electronic Signature(s) Signed: 09/18/2022 4:16:40 PM By: Angelina Pih Entered By: Angelina Pih on 09/18/2022 11:09:55 -------------------------------------------------------------------------------- Pain Assessment Details Patient Name: Date of Service: Carol Kent, Carol Kent 09/18/2022 10:30 A M Medical Record Number: [id number] Patient Account Number:  [id number] Date of Birth/Sex: Treating RN: [birth year] (82 y.o. [name] [place], [place] C ([id number]) [id number] Page 7 of 9 Primary Care Tawana Pasch: [name] Other Clinician: Referring Fathima Bartl: Treating Barrett Goldie/Extender: Florian Buff in Treatment: 10 Active Problems Location of Pain Severity and Description of Pain Patient Has Paino No Site Locations Rate the pain. Current Pain Level: 0 Pain Management and Medication Current Pain Management: Electronic  Signature(s) Signed: 09/18/2022 4:16:40 PM By: Angelina Pih Entered By: Angelina Pih on 09/18/2022 10:44:33 -------------------------------------------------------------------------------- Patient/Caregiver Education Details Patient Name: Date of Service: Carol Florence 6/20/2024andnbsp10:30 A M Medical Record Number: [id number] Patient Account Number: [id number] Date of Birth/Gender: Treating RN: Dec 03, 1940 (82 y.o. [name] Primary Care Physician: [name] Other Clinician: Referring Physician: Treating Physician/Extender: Florian Buff in Treatment: 10 Education Assessment Education Provided To: Patient and Caregiver Education Topics Provided Wound/Skin Impairment: Handouts: Caring for Your Ulcer Methods: Explain/Verbal Responses: State content correctly Electronic Signature(s) Signed: 09/18/2022 4:16:40 PM By: Hyacinth Meeker, Claretta Fraise ([id number]) [id number] Page 8 of 9 Entered By: Angelina Pih on 09/18/2022 11:10:05 -------------------------------------------------------------------------------- Wound Assessment Details Patient Name: Date of Service: Carol Kent, Carol Kent 09/18/2022 10:30 A M Medical Record Number: [id number] Patient Account Number: [id number] Date of Birth/Sex: Treating RN: 1940/10/26 (82 y.o. [name] Primary Care Victorina Kable: [name] Other Clinician: Referring Khyler Eschmann: Treating Laneta Guerin/Extender: Florian Buff in Treatment: 10 Wound Status Wound Number: 7 Primary Skin Tear Etiology: Wound Location: Left Knee Wound Status: Open Wounding Event: Skin Tear/Laceration Comorbid Cataracts, Anemia, Congestive Heart Failure, Rheumatoid Date Acquired: 08/30/2022 History: Arthritis Weeks Of Treatment: 2 Clustered Wound: No Photos Wound Measurements Length: (cm) 3.5 Width: (cm) 4.2 Depth: (cm) 0.1 Area: (cm) 11.545 Volume: (cm) 1.155 %  Reduction in Area: 58.1% % Reduction in Volume: 58.1% Epithelialization: Small (1-33%) Tunneling: No Undermining: No Wound Description Classification: Full Thickness Without Exposed Suppor Exudate Amount: Medium Exudate Type: Serosanguineous Exudate Color: red, brown t Structures Foul Odor After Cleansing: No Slough/Fibrino Yes Wound Bed Granulation Amount: Large (67-100%) Exposed Structure Granulation Quality: Red, Pink Fascia Exposed: No Necrotic Amount: Small (1-33%) Fat Layer (Subcutaneous Tissue) Exposed: Yes Necrotic Quality: Adherent Slough Tendon Exposed: No Muscle Exposed: No Joint Exposed: No Bone Exposed: No Treatment Notes Wound #7 (Knee) Wound Laterality: Left Cleanser Carol Kent, Carol Kent ([id number]) [id number] Page 9 of 9 Soap and Water Discharge Instruction: Gently cleanse wound with antibacterial soap, rinse and pat dry prior to dressing wounds Peri-Wound Care Topical Primary Dressing Xeroform 5x9-HBD (in/in) Discharge Instruction: please apply a double layer of xeroform Apply Xeroform 5x9-HBD (in/in) as directed Double layer Secondary Dressing ABD Pad 5x9 (in/in) Discharge Instruction: Cover with ABD pad Secured With Medipore T - 83M Medipore H Soft Cloth Surgical T ape ape, 2x2 (in/yd) Conform 4'' - Conforming Stretch Gauze Bandage 4x75 (in/in) Discharge Instruction: Apply as directed Tubigrip Size F, 4x10 (in/yd) Discharge Instruction: Apply Tubigrip F 3 finger-widths above knee and base of toes to secure dressing and/or for swelling. Single layer Compression Wrap Compression Stockings Add-Ons Electronic Signature(s) Signed: 09/18/2022 4:16:40 PM By: Angelina Pih Entered By: Angelina Pih on 09/18/2022 10:45:15 -------------------------------------------------------------------------------- Vitals Details Patient Name: Date of Service: Carol Florence. 09/18/2022 10:30 A M Medical Record Number: [id number] Patient  Account Number: [id number] Date of Birth/Sex: Treating RN: 1941/03/26 (82 y.o. [name] Primary Care Bennet Kujawa: [name] Other Clinician: Referring Megon Kalina: Treating Ansleigh Safer/Extender: Florian Buff in Treatment: 10 Vital Signs Time Taken: 10:30 Temperature (  F): 97.3 Height (in): 62 Pulse (bpm): 96 Weight (lbs): 150 Respiratory Rate (breaths/min): 18 Body Mass Index (BMI): 27.4 Blood Pressure (mmHg): 123/71 Reference Range: 80 - 120 mg / dl Electronic Signature(s) Signed: 09/18/2022 4:16:40 PM By: Angelina Pih Entered By: Angelina Pih on 09/18/2022 10:44:28

## 2022-09-18 NOTE — Telephone Encounter (Signed)
1120 am.  3rd attempt to reach patient/daughter regarding PC referral.  No answer on home line and unable to LVM.  Phone call made daughter [name].  Message left requesting a call back.

## 2022-09-18 NOTE — Progress Notes (Addendum)
ASHEA, WINIARSKI ([id number]) [id number] Page 1 of 7 Visit Report for 09/18/2022 Chief Complaint Document Details Patient Name: Date of Service: Carol Kent, Carol Kent 09/18/2022 10:30 A M Medical Record Number: [id number] Patient Account Number: [id number] Date of Birth/Sex: Treating RN: 1940/12/13 (82 y.o. [name] Primary Care Provider: [name] Other Clinician: Referring Provider: Treating Provider/Extender: Florian Buff in Treatment: 10 Information Obtained from: Patient Chief Complaint Right LE Ulcers/Skin Tears and Left Knee Skin Tear Electronic Signature(s) Signed: 09/18/2022 10:36:06 AM By: Allen Derry PA-C Entered By: Allen Derry on 09/18/2022 10:36:06 -------------------------------------------------------------------------------- HPI Details Patient Name: Date of Service: Carol Kent 09/18/2022 10:30 A M Medical Record Number: [id number] Patient Account Number: [id number] Date of Birth/Sex: Treating RN: Feb 24, 1941 (82 y.o. [name] Primary Care Provider: [name] Other Clinician: Referring Provider: Treating Provider/Extender: Florian Buff in Treatment: 10 History of Present Illness HPI Description: 07-07-2022 upon evaluation today patient appears to be doing somewhat poorly in regard to wounds that actually began in the right leg as a result of trauma. She tells me that she has been having some discomfort it is not hurting too bad however. She was originally placed on Keflex but she is finished this. She did not go to the ER to have this checked out for 3 days after it initially happened so they were not able to Steri-Strip anything back down. She has been using bacitracin and Xeroform. She does have family members that have been helping her with dressing changes. With that being said she does not appear to have any signs of infection right now but nonetheless does have  obviously the wounds that need to be addressed. Patient does have a history of chronic kidney disease stage IV, congestive heart failure, lower extremity venous insufficiency and swelling although this is not too significant, and peripheral vascular disease as ABIs are noncompressible. She does seem to have good blood flow however. Her capillary refill was good and the wounds themselves do seem to be bleeding even with light cleaning. 07-14-2022 upon evaluation today patient appears to be doing well currently in regard to her wounds. She is actually showing signs of improvement which is good news. Fortunately there does not appear to be any signs of active infection locally nor systemically which is great news. No fevers, chills, nausea, vomiting, or diarrhea. 07-28-2022 upon evaluation today patient appears to be doing well currently in regard to her wounds which are actually measuring smaller this is great news. She does have a couple of blisters around the knee from where she bumped it but were not really sure exactly what she bumped it on. Nonetheless I am going to likely help to manage that as well now and see if we can prevent this from opening and turning into a wound. Carol Kent, Carol Kent ([id number]) [id number] Page 2 of 7 08-07-2022 upon evaluation today patient appears to be doing better in regard to her right leg with original wound although she does have 1 that is open just below the original at the knee. This is where she had some of the blood blisters formed. Fortunately there does not appear to be any signs of active infection locally nor systemically. Unfortunately she still has a tremendous amount of swelling she does see cardiology at the end of the month but she runs out of her fluid pills tomorrow. They are going to see if they can get a refill until she can get in to be  seen. 08-15-2022 upon evaluation today patient appears to be doing well currently in  general in regard to her wounds. Everything is showing signs of improvement except for the newer wounds which do improve still have been on a slower path obviously due to just the nature of the wounds not being nearly as old. Fortunately there does not appear to be any signs of active infection locally nor systemically at this point. 09-02-2022 upon evaluation today patient appears to be doing excellent in regard to her right leg which is showing signs of improvement unfortunately left leg she has a new skin tear from a new fall. I am not sure how certain we will be able to approximate this especially being at the knee is going to be very tricky. 09-09-2022 upon evaluation today patient appears to be doing well currently in regard to her wound. In fact this is showing signs of improvement not everything tacked back down but we were able to get some of the skin to reapproximate the more clearly. Fortunately I do not see any signs of infection. 09-18-2022 upon evaluation patient's wound bed actually showed signs of doing quite well on the left leg. This knee area is really healing quite nicely. I am extremely pleased with where we stand I think that she is moving in the right direction. Electronic Signature(s) Signed: 09/18/2022 12:27:06 PM By: Allen Derry PA-C Entered By: Allen Derry on 09/18/2022 12:27:06 -------------------------------------------------------------------------------- Physical Exam Details Patient Name: Date of Service: Carol Kent, Carol Kent 09/18/2022 10:30 A M Medical Record Number: [id number] Patient Account Number: [id number] Date of Birth/Sex: Treating RN: 1941/03/16 (82 y.o. [name] Primary Care Provider: [name] Other Clinician: Referring Provider: Treating Provider/Extender: Florian Buff in Treatment: 10 Constitutional Well-nourished and well-hydrated in no acute distress. Respiratory normal breathing without  difficulty. Psychiatric this patient is able to make decisions and demonstrates good insight into disease process. Alert and Oriented x 3. pleasant and cooperative. Notes Upon inspection patient's wound bed showed signs of good granulation epithelization at this point. Fortunately I see no evidence of worsening overall and I do believe that she is really doing quite well at this time. Electronic Signature(s) Signed: 09/18/2022 12:27:28 PM By: Allen Derry PA-C Entered By: Allen Derry on 09/18/2022 12:27:28 Carol Kent ([id number]) [id number] Page 3 of 7 -------------------------------------------------------------------------------- Physician Orders Details Patient Name: Date of Service: Carol Kent, Carol Kent 09/18/2022 10:30 A M Medical Record Number: [id number] Patient Account Number: [id number] Date of Birth/Sex: Treating RN: 06/03/1940 (82 y.o. [name] Primary Care Provider: [name] Other Clinician: Referring Provider: Treating Provider/Extender: Florian Buff in Treatment: 10 Verbal / Phone Orders: No Diagnosis Coding ICD-10 Coding Code Description [id number] Laceration without foreign body, right lower leg, initial encounter L97.812 Non-pressure chronic ulcer of other part of right lower leg with fat layer exposed S81.012A Laceration without foreign body, left knee, initial encounter N18.4 Chronic kidney disease, stage 4 (severe) I50.42 Chronic combined systolic (congestive) and diastolic (congestive) heart failure 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 right lower extremity I73.89 Other specified peripheral vascular diseases Follow-up Appointments Return Appointment in 1 week. Home Health Home Health Company: - [facility] ADMIT to Home Health for wound care. May utilize formulary equivalent dressing for wound treatment orders unless otherwise specified. Home Health Nurse  may visit PRN to address patients wound care needs. CONTINUE Home Health for wound care. May utilize formulary equivalent dressing for wound treatment orders unless otherwise specified.  Home Health Nurse may visit PRN to address patients wound care needs. Scheduled days for dressing changes to be completed; exception, patient has scheduled wound care visit that day. **Please direct any NON-WOUND related issues/requests for orders to patient's Primary Care Physician. **If current dressing causes regression in wound condition, may D/C ordered dressing product/s and apply Normal Saline Moist Dressing daily until next Wound Healing Center or Other MD appointment. **Notify Wound Healing Center of regression in wound condition at [phone]. Other Home Health Orders/Instructions: - for tubi grip size F , please place on legs just above toes and 2 fingers above knee for swelling. Please place ace wrap to left knee. Bathing/ Shower/ Hygiene May shower; gently cleanse wound with antibacterial soap, rinse and pat dry prior to dressing wounds - using dial soap No tub bath. Anesthetic (Use 'Patient Medications' Section for Anesthetic Order Entry) Lidocaine applied to wound bed Edema Control - Lymphedema / Segmental Compressive Device / Other Tubigrip single layer applied. - size F bilateral LL Non-Wound Condition Right Lower Extremity Cleanse affected area with antibacterial soap and water, Apply appropriate compression. dditional non-wound orders/instructions: - On right lower leg weeping area, may use double layer xeroform on front proximal of lower leg, with an A abd pad and keep in place with kerlex and wear tubi grip size F. please change this every other day Wound Treatment Wound #7 - Knee Wound Laterality: Left Cleanser: Soap and Water Every Other Day/30 Days Discharge Instructions: Gently cleanse wound with antibacterial soap, rinse and pat dry prior to dressing wounds Prim Dressing:  Xeroform 5x9-HBD (in/in) Every Other Day/30 Days ary Discharge Instructions: please apply a double layer of xeroform Apply Xeroform 5x9-HBD (in/in) as directed Double layer Secondary Dressing: ABD Pad 5x9 (in/in) Every Other Day/30 Days Discharge Instructions: Cover with ABD pad Secured With: Medipore T - 65M Medipore H Soft Cloth Surgical T ape ape, 2x2 (in/yd) Every Other Day/30 Days Secured With: Conform 4'' - Conforming Stretch Gauze Bandage 4x75 (in/in) Every Other Day/30 Days Discharge Instructions: Apply as directed Secured With: Tubigrip Size F, 4x10 (in/yd) Every Other Day/30 Days Discharge Instructions: Apply Tubigrip F 3 finger-widths above knee and base of toes to secure dressing and/or for swelling. Single layer Carol Kent, Carol Kent ([id number]) [id number] Page 4 of 7 Electronic Signature(s) Signed: 09/18/2022 4:16:40 PM By: Angelina Pih Signed: 09/18/2022 5:52:14 PM By: Allen Derry PA-C Entered By: Angelina Pih on 09/18/2022 11:17:59 -------------------------------------------------------------------------------- Problem List Details Patient Name: Date of Service: Carol Kent 09/18/2022 10:30 A M Medical Record Number: [id number] Patient Account Number: [id number] Date of Birth/Sex: Treating RN: 1940-12-16 (82 y.o. [name] Primary Care Provider: [name] Other Clinician: Referring Provider: Treating Provider/Extender: Florian Buff in Treatment: 10 Active Problems ICD-10 Encounter Code Description Active Date MDM Diagnosis S81.811A Laceration without foreign body, right lower leg, initial encounter 07/07/2022 No Yes L97.812 Non-pressure chronic ulcer of other part of right lower leg with fat layer 07/07/2022 No Yes exposed S81.012A Laceration without foreign body, left knee, initial encounter 09/02/2022 No Yes N18.4 Chronic kidney disease, stage 4 (severe) 07/07/2022 No Yes I50.42 Chronic combined  systolic (congestive) and diastolic (congestive) heart failure 07/07/2022 No Yes 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 right 07/07/2022 No Yes lower extremity I73.89 Other specified peripheral vascular diseases 07/07/2022 No Yes Inactive Problems Resolved Problems Electronic Signature(s) Signed: 09/18/2022 10:36:01 AM By: Allen Derry PA-C Entered By: Allen Derry on 09/18/2022 10:36:01 Carol Kent ([id number]) [id number] Page 5  of 7 -------------------------------------------------------------------------------- Progress Note Details Patient Name: Date of Service: Carol Kent, Carol Kent 09/18/2022 10:30 A M Medical Record Number: [id number] Patient Account Number: [id number] Date of Birth/Sex: Treating RN: 1940-05-07 (82 y.o. [name] Primary Care Provider: [name] Other Clinician: Referring Provider: Treating Provider/Extender: Florian Buff in Treatment: 10 Subjective Chief Complaint Information obtained from Patient Right LE Ulcers/Skin Tears and Left Knee Skin Tear History of Present Illness (HPI) 07-07-2022 upon evaluation today patient appears to be doing somewhat poorly in regard to wounds that actually began in the right leg as a result of trauma. She tells me that she has been having some discomfort it is not hurting too bad however. She was originally placed on Keflex but she is finished this. She did not go to the ER to have this checked out for 3 days after it initially happened so they were not able to Steri-Strip anything back down. She has been using bacitracin and Xeroform. She does have family members that have been helping her with dressing changes. With that being said she does not appear to have any signs of infection right now but nonetheless does have obviously the wounds that need to be addressed. Patient does have a history of chronic kidney disease stage IV,  congestive heart failure, lower extremity venous insufficiency and swelling although this is not too significant, and peripheral vascular disease as ABIs are noncompressible. She does seem to have good blood flow however. Her capillary refill was good and the wounds themselves do seem to be bleeding even with light cleaning. 07-14-2022 upon evaluation today patient appears to be doing well currently in regard to her wounds. She is actually showing signs of improvement which is good news. Fortunately there does not appear to be any signs of active infection locally nor systemically which is great news. No fevers, chills, nausea, vomiting, or diarrhea. 07-28-2022 upon evaluation today patient appears to be doing well currently in regard to her wounds which are actually measuring smaller this is great news. She does have a couple of blisters around the knee from where she bumped it but were not really sure exactly what she bumped it on. Nonetheless I am going to likely help to manage that as well now and see if we can prevent this from opening and turning into a wound. 08-07-2022 upon evaluation today patient appears to be doing better in regard to her right leg with original wound although she does have 1 that is open just below the original at the knee. This is where she had some of the blood blisters formed. Fortunately there does not appear to be any signs of active infection locally nor systemically. Unfortunately she still has a tremendous amount of swelling she does see cardiology at the end of the month but she runs out of her fluid pills tomorrow. They are going to see if they can get a refill until she can get in to be seen. 08-15-2022 upon evaluation today patient appears to be doing well currently in general in regard to her wounds. Everything is showing signs of improvement except for the newer wounds which do improve still have been on a slower path obviously due to just the nature of the wounds  not being nearly as old. Fortunately there does not appear to be any signs of active infection locally nor systemically at this point. 09-02-2022 upon evaluation today patient appears to be doing excellent in regard to her right leg which is  showing signs of improvement unfortunately left leg she has a new skin tear from a new fall. I am not sure how certain we will be able to approximate this especially being at the knee is going to be very tricky. 09-09-2022 upon evaluation today patient appears to be doing well currently in regard to her wound. In fact this is showing signs of improvement not everything tacked back down but we were able to get some of the skin to reapproximate the more clearly. Fortunately I do not see any signs of infection. 09-18-2022 upon evaluation patient's wound bed actually showed signs of doing quite well on the left leg. This knee area is really healing quite nicely. I am extremely pleased with where we stand I think that she is moving in the right direction. Objective Constitutional Well-nourished and well-hydrated in no acute distress. Vitals Time Taken: 10:30 AM, Height: 62 in, Weight: 150 lbs, BMI: 27.4, Temperature: 97.3 F, Pulse: 96 bpm, Respiratory Rate: 18 breaths/min, Blood Pressure: 123/71 mmHg. Respiratory normal breathing without difficulty. Carol Kent, Carol Kent ([id number]) [id number] Page 6 of 7 Psychiatric this patient is able to make decisions and demonstrates good insight into disease process. Alert and Oriented x 3. pleasant and cooperative. General Notes: Upon inspection patient's wound bed showed signs of good granulation epithelization at this point. Fortunately I see no evidence of worsening overall and I do believe that she is really doing quite well at this time. Integumentary (Hair, Skin) Wound #7 status is Open. Original cause of wound was Skin T ear/Laceration. The date acquired was: 08/30/2022. The wound has been in  treatment 2 weeks. The wound is located on the Left Knee. The wound measures 3.5cm length x 4.2cm width x 0.1cm depth; 11.545cm^2 area and 1.155cm^3 volume. There is Fat Layer (Subcutaneous Tissue) exposed. There is no tunneling or undermining noted. There is a medium amount of serosanguineous drainage noted. There is large (67-100%) red, pink granulation within the wound bed. There is a small (1-33%) amount of necrotic tissue within the wound bed including Adherent Slough. 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 Home Health: Home Health Company: - [facility] ADMIT to Home Health for wound care. May utilize formulary equivalent dressing for wound treatment orders unless otherwise specified. Home Health Nurse may visit PRN to address patients wound care needs. CONTINUE Home Health for wound care. May utilize formulary equivalent dressing for wound treatment orders unless otherwise specified. Home Health Nurse may visit PRN to address patients wound care needs. Scheduled days for dressing changes to be completed; exception, patient has scheduled wound care visit that day. **Please direct any NON-WOUND related issues/requests for orders to patient's Primary Care Physician. **If current dressing causes regression in wound condition, may D/C ordered dressing product/s and apply Normal Saline Moist Dressing daily until next Wound Healing Center or Other MD appointment. **Notify  Wound Healing Center of regression in wound condition at [phone]. Other Home Health Orders/Instructions: - for tubi grip size F , please place on legs just above toes and 2 fingers above knee for swelling. Please place ace wrap to left knee. Bathing/ Shower/  Hygiene: May shower; gently cleanse wound with antibacterial soap, rinse and pat dry prior to dressing wounds - using dial soap No tub bath. Anesthetic (Use 'Patient Medications' Section for Anesthetic Order Entry): Lidocaine applied to wound bed Edema Control - Lymphedema / Segmental Compressive Device / Other: Tubigrip single layer applied. - size F bilateral LL Non-Wound Condition: Cleanse affected area with antibacterial soap and water, Apply appropriate compression. Additional non-wound orders/instructions: - On right lower leg weeping area, may use double layer xeroform on front proximal of lower leg, with an abd pad and keep in place with kerlex and wear tubi grip size F. please change this every other day WOUND #7: - Knee Wound Laterality: Left Cleanser: Soap and Water Every Other Day/30 Days Discharge Instructions: Gently cleanse wound with antibacterial soap, rinse and pat dry prior to dressing wounds Prim Dressing: Xeroform 5x9-HBD (in/in) Every Other Day/30 Days ary Discharge Instructions: please apply a double layer of xeroform Apply Xeroform 5x9-HBD (in/in) as directed Double layer Secondary Dressing: ABD Pad 5x9 (in/in) Every Other Day/30 Days Discharge Instructions: Cover with ABD pad Secured With: Medipore T - 67M Medipore H Soft Cloth Surgical T ape ape, 2x2 (in/yd) Every Other Day/30 Days Secured With: Conform 4'' - Conforming Stretch Gauze Bandage 4x75 (in/in) Every Other Day/30 Days Discharge Instructions: Apply as directed Secured With: Tubigrip Size F, 4x10 (in/yd) Every Other Day/30 Days Discharge Instructions: Apply Tubigrip F 3 finger-widths above knee and base of toes to secure dressing and/or for  swelling. Single layer 1. I am going to suggest that we continue to monitor for any signs of worsening overall. I do believe that patient is making great progress towards closure on his left knee on the right side she still has a small area that is leaking therefore we will continue with the Tubigrip's over here as well and we will see how things proceed. 2. I am good recommend that we use the Xeroform gauze again followed by an ABD pad and then Tubigrip. We will see patient back for reevaluation in 1 week here in the clinic. If anything worsens or changes patient will contact our office for additional recommendations. Electronic Signature(s) Carol Kent, Carol Kent ([id number]) [id number] Page 7 of 7 Signed: 09/18/2022 12:27:55 PM By: Allen Derry PA-C Entered By: Allen Derry on 09/18/2022 12:27:55 -------------------------------------------------------------------------------- SuperBill Details Patient Name: Date of Service: Carol Kent 09/18/2022 Medical Record Number: [id number] Patient Account Number: [id number] Date of Birth/Sex: Treating RN: [birth year] (82 y.o. [name] Primary Care Provider: [name] Other Clinician: Referring Provider: Treating Provider/Extender: Florian Buff in Treatment: 10 Diagnosis Coding ICD-10 Codes Code Description [phone] Laceration without foreign body, right lower leg, initial encounter L97.812 Non-pressure chronic ulcer of other part of right lower leg with fat layer exposed S81.012A Laceration without foreign body, left knee, initial encounter N18.4 Chronic kidney disease, stage 4 (severe) I50.42 Chronic combined systolic (congestive) and diastolic (congestive) heart failure 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 right lower extremity I73.89 Other specified peripheral vascular diseases Facility Procedures : CPT4 Code: [id number] Description:  99213 - WOUND CARE VISIT-LEV 3 EST PT Modifier: Quantity: 1 Physician Procedures : CPT4 Code Description Modifier [id number] - WC PHYS LEVEL 3 - EST PT ICD-10 Diagnosis Description S81.811A Laceration without foreign body, right lower leg, initial encounter L97.812 Non-pressure chronic ulcer of other part of right lower leg with  fat layer exposed S81.012A Laceration without foreign body, left knee, initial encounter N18.4 Chronic kidney  disease, stage 4 (severe) Quantity: 1 Electronic Signature(s) Signed: 09/18/2022 12:28:23 PM By: Allen Derry PA-C Entered By: Allen Derry on 09/18/2022 12:28:22

## 2022-09-30 ENCOUNTER — Ambulatory Visit: Payer: Medicare Other | Admitting: Physician Assistant

## 2022-10-05 ENCOUNTER — Inpatient Hospital Stay: Payer: Medicare Other

## 2022-10-05 ENCOUNTER — Other Ambulatory Visit: Payer: Self-pay

## 2022-10-05 ENCOUNTER — Inpatient Hospital Stay
Admission: EM | Admit: 2022-10-05 | Discharge: 2022-10-08 | DRG: 689 | Disposition: A | Discharge status: Skilled Nursing Facility | Payer: Medicare Other | Attending: Internal Medicine | Admitting: Internal Medicine

## 2022-10-05 ENCOUNTER — Emergency Department: Payer: Medicare Other

## 2022-10-05 DIAGNOSIS — M069 Rheumatoid arthritis, unspecified: Secondary | ICD-10-CM | POA: Diagnosis not present

## 2022-10-05 DIAGNOSIS — I5032 Chronic diastolic (congestive) heart failure: Secondary | ICD-10-CM | POA: Diagnosis present

## 2022-10-05 DIAGNOSIS — N3000 Acute cystitis without hematuria: Secondary | ICD-10-CM | POA: Diagnosis not present

## 2022-10-05 DIAGNOSIS — F0390 Unspecified dementia without behavioral disturbance: Secondary | ICD-10-CM

## 2022-10-05 DIAGNOSIS — R471 Dysarthria and anarthria: Secondary | ICD-10-CM | POA: Diagnosis present

## 2022-10-05 DIAGNOSIS — B9689 Other specified bacterial agents as the cause of diseases classified elsewhere: Secondary | ICD-10-CM | POA: Diagnosis present

## 2022-10-05 DIAGNOSIS — M25561 Pain in right knee: Secondary | ICD-10-CM | POA: Diagnosis not present

## 2022-10-05 DIAGNOSIS — Y92009 Unspecified place in unspecified non-institutional (private) residence as the place of occurrence of the external cause: Secondary | ICD-10-CM

## 2022-10-05 DIAGNOSIS — G9341 Metabolic encephalopathy: Secondary | ICD-10-CM | POA: Diagnosis not present

## 2022-10-05 DIAGNOSIS — I878 Other specified disorders of veins: Secondary | ICD-10-CM | POA: Diagnosis present

## 2022-10-05 DIAGNOSIS — R7989 Other specified abnormal findings of blood chemistry: Secondary | ICD-10-CM

## 2022-10-05 DIAGNOSIS — Z880 Allergy status to penicillin: Secondary | ICD-10-CM | POA: Diagnosis not present

## 2022-10-05 DIAGNOSIS — N189 Chronic kidney disease, unspecified: Secondary | ICD-10-CM | POA: Diagnosis not present

## 2022-10-05 DIAGNOSIS — Z882 Allergy status to sulfonamides status: Secondary | ICD-10-CM

## 2022-10-05 DIAGNOSIS — N179 Acute kidney failure, unspecified: Secondary | ICD-10-CM | POA: Diagnosis present

## 2022-10-05 DIAGNOSIS — G8929 Other chronic pain: Secondary | ICD-10-CM | POA: Diagnosis present

## 2022-10-05 DIAGNOSIS — N390 Urinary tract infection, site not specified: Secondary | ICD-10-CM | POA: Diagnosis not present

## 2022-10-05 DIAGNOSIS — F0154 Vascular dementia, unspecified severity, with anxiety: Secondary | ICD-10-CM | POA: Diagnosis present

## 2022-10-05 DIAGNOSIS — W19XXXA Unspecified fall, initial encounter: Secondary | ICD-10-CM | POA: Diagnosis present

## 2022-10-05 DIAGNOSIS — Z888 Allergy status to other drugs, medicaments and biological substances status: Secondary | ICD-10-CM | POA: Diagnosis not present

## 2022-10-05 DIAGNOSIS — F01518 Vascular dementia, unspecified severity, with other behavioral disturbance: Secondary | ICD-10-CM | POA: Diagnosis present

## 2022-10-05 DIAGNOSIS — E860 Dehydration: Secondary | ICD-10-CM | POA: Diagnosis present

## 2022-10-05 DIAGNOSIS — R4182 Altered mental status, unspecified: Secondary | ICD-10-CM | POA: Diagnosis not present

## 2022-10-05 DIAGNOSIS — Z79899 Other long term (current) drug therapy: Secondary | ICD-10-CM

## 2022-10-05 DIAGNOSIS — I872 Venous insufficiency (chronic) (peripheral): Secondary | ICD-10-CM | POA: Diagnosis not present

## 2022-10-05 DIAGNOSIS — R5381 Other malaise: Secondary | ICD-10-CM | POA: Diagnosis present

## 2022-10-05 DIAGNOSIS — N184 Chronic kidney disease, stage 4 (severe): Secondary | ICD-10-CM | POA: Diagnosis present

## 2022-10-05 DIAGNOSIS — Z885 Allergy status to narcotic agent status: Secondary | ICD-10-CM

## 2022-10-05 DIAGNOSIS — G9340 Encephalopathy, unspecified: Secondary | ICD-10-CM

## 2022-10-05 DIAGNOSIS — R296 Repeated falls: Secondary | ICD-10-CM

## 2022-10-05 DIAGNOSIS — I5030 Unspecified diastolic (congestive) heart failure: Secondary | ICD-10-CM | POA: Insufficient documentation

## 2022-10-05 LAB — CBC WITH DIFFERENTIAL/PLATELET
Abs Immature Granulocytes: 0.05 10*3/uL (ref 0.00–0.07)
Basophils Absolute: 0 10*3/uL (ref 0.0–0.1)
Basophils Relative: 0 %
Eosinophils Absolute: 0 10*3/uL (ref 0.0–0.5)
Eosinophils Relative: 0 %
HCT: 28.8 % — ABNORMAL LOW (ref 36.0–46.0)
Hemoglobin: 9.4 g/dL — ABNORMAL LOW (ref 12.0–15.0)
Immature Granulocytes: 1 %
Lymphocytes Relative: 18 %
Lymphs Abs: 1.3 10*3/uL (ref 0.7–4.0)
MCH: 30.1 pg (ref 26.0–34.0)
MCHC: 32.6 g/dL (ref 30.0–36.0)
MCV: 92.3 fL (ref 80.0–100.0)
Monocytes Absolute: 0.8 10*3/uL (ref 0.1–1.0)
Monocytes Relative: 11 %
Neutro Abs: 5.1 10*3/uL (ref 1.7–7.7)
Neutrophils Relative %: 70 %
Platelets: 171 10*3/uL (ref 150–400)
RBC: 3.12 MIL/uL — ABNORMAL LOW (ref 3.87–5.11)
RDW: 14.5 % (ref 11.5–15.5)
WBC: 7.3 10*3/uL (ref 4.0–10.5)
nRBC: 0 % (ref 0.0–0.2)

## 2022-10-05 LAB — BASIC METABOLIC PANEL
Anion gap: 9 (ref 5–15)
BUN: 34 mg/dL — ABNORMAL HIGH (ref 8–23)
CO2: 23 mmol/L (ref 22–32)
Calcium: 8.4 mg/dL — ABNORMAL LOW (ref 8.9–10.3)
Chloride: 107 mmol/L (ref 98–111)
Creatinine, Ser: 2.32 mg/dL — ABNORMAL HIGH (ref 0.44–1.00)
GFR, Estimated: 20 mL/min — ABNORMAL LOW (ref >60)
Glucose, Bld: 109 mg/dL — ABNORMAL HIGH (ref 70–99)
Potassium: 3.6 mmol/L (ref 3.5–5.1)
Sodium: 139 mmol/L (ref 135–145)

## 2022-10-05 LAB — URINALYSIS, ROUTINE W REFLEX MICROSCOPIC
Bilirubin Urine: NEGATIVE
Glucose, UA: NEGATIVE mg/dL
Hgb urine dipstick: NEGATIVE
Ketones, ur: NEGATIVE mg/dL
Nitrite: POSITIVE — AB
Protein, ur: 30 mg/dL — AB
Specific Gravity, Urine: 1.018 (ref 1.005–1.030)
pH: 6 (ref 5.0–8.0)

## 2022-10-05 LAB — CK: Total CK: 61 U/L (ref 38–234)

## 2022-10-05 MED ORDER — HYDROCODONE-ACETAMINOPHEN 5-325 MG PO TABS
1.0000 | ORAL_TABLET | ORAL | Status: DC | PRN
  Administered 2022-10-05: 2 via ORAL
  Filled 2022-10-05: qty 2

## 2022-10-05 MED ORDER — ONDANSETRON HCL 4 MG/2ML IJ SOLN: 4.0000 mg | Freq: Four times a day (QID) | INTRAMUSCULAR | Status: DC | PRN

## 2022-10-05 MED ORDER — MEMANTINE HCL 5 MG PO TABS
10.0000 mg | ORAL_TABLET | Freq: Two times a day (BID) | ORAL | Status: DC
  Administered 2022-10-05 – 2022-10-08 (×6): 10 mg via ORAL
  Filled 2022-10-05 (×6): qty 2

## 2022-10-05 MED ORDER — ACETAMINOPHEN 500 MG PO TABS
1000.0000 mg | ORAL_TABLET | Freq: Once | ORAL | Status: AC
  Administered 2022-10-05: 1000 mg via ORAL
  Filled 2022-10-05: qty 2

## 2022-10-05 MED ORDER — SODIUM CHLORIDE 0.9 % IV BOLUS
500.0000 mL | Freq: Once | INTRAVENOUS | Status: AC
  Administered 2022-10-05: 500 mL via INTRAVENOUS

## 2022-10-05 MED ORDER — OLANZAPINE 10 MG IM SOLR
2.5000 mg | Freq: Once | INTRAMUSCULAR | Status: AC | PRN
  Administered 2022-10-05: 2.5 mg via INTRAMUSCULAR
  Filled 2022-10-05: qty 10

## 2022-10-05 MED ORDER — SODIUM CHLORIDE 0.9 % IV SOLN
2.0000 g | Freq: Once | INTRAVENOUS | Status: AC
  Administered 2022-10-05: 2 g via INTRAVENOUS
  Filled 2022-10-05: qty 20

## 2022-10-05 MED ORDER — ONDANSETRON HCL 4 MG PO TABS: 4.0000 mg | ORAL_TABLET | Freq: Four times a day (QID) | ORAL | Status: DC | PRN

## 2022-10-05 MED ORDER — ACETAMINOPHEN 650 MG RE SUPP: 650.0000 mg | Freq: Four times a day (QID) | RECTAL | Status: DC | PRN

## 2022-10-05 MED ORDER — LACTATED RINGERS IV BOLUS
1000.0000 mL | Freq: Once | INTRAVENOUS | Status: AC
  Administered 2022-10-05: 1000 mL via INTRAVENOUS

## 2022-10-05 MED ORDER — ENOXAPARIN SODIUM 30 MG/0.3ML IJ SOSY
30.0000 mg | PREFILLED_SYRINGE | INTRAMUSCULAR | Status: DC
  Administered 2022-10-05 – 2022-10-07 (×3): 30 mg via SUBCUTANEOUS
  Filled 2022-10-05 (×3): qty 0.3

## 2022-10-05 MED ORDER — SODIUM CHLORIDE 0.9 % IV SOLN: INTRAVENOUS | Status: DC

## 2022-10-05 MED ORDER — ACETAMINOPHEN 325 MG PO TABS
650.0000 mg | ORAL_TABLET | Freq: Four times a day (QID) | ORAL | Status: DC | PRN
  Administered 2022-10-06 – 2022-10-08 (×2): 650 mg via ORAL
  Filled 2022-10-05 (×2): qty 2

## 2022-10-05 MED ORDER — SODIUM CHLORIDE 0.9 % IV SOLN: Freq: Once | INTRAVENOUS | Status: DC

## 2022-10-05 MED ORDER — HYDROXYCHLOROQUINE SULFATE 200 MG PO TABS
200.0000 mg | ORAL_TABLET | Freq: Two times a day (BID) | ORAL | Status: DC
  Administered 2022-10-05 – 2022-10-08 (×6): 200 mg via ORAL
  Filled 2022-10-05 (×6): qty 1

## 2022-10-05 MED ORDER — SODIUM CHLORIDE 0.9 % IV SOLN
1.0000 g | INTRAVENOUS | Status: AC
  Administered 2022-10-06 – 2022-10-07 (×2): 1 g via INTRAVENOUS
  Filled 2022-10-05 (×2): qty 10

## 2022-10-05 NOTE — Assessment & Plan Note (Signed)
Creatinine 2.3 today with a baseline creatinine around 1-2 GFR 20 Mildly dry Gentle IV fluid hydration in setting of HFpEF Hold nephrotoxic agents acutely Monitor

## 2022-10-05 NOTE — ED Triage Notes (Signed)
Pt presents to the ED via EMS c/o a potential fall and altered mental status. Per EMS pt was laying on the ground outside the back steps of her house. Pt states she may have fallen down a "few steps" but she does not remember. Pt has a hx of dementia.

## 2022-10-05 NOTE — ED Notes (Signed)
Pt with c-collar on, has multiple bruises on body throughout.  She doesn't have any noted deformities.  Daughter remains at bedside

## 2022-10-05 NOTE — Assessment & Plan Note (Signed)
Urinalysis indicative of infection with noted recurrent falls confusion at home as well as dehydration IV Rocephin Urine culture Follow-up

## 2022-10-05 NOTE — Assessment & Plan Note (Signed)
Decompensated baseline dementia with confusion in the setting of dehydration, UTI, AKI CT head stable IV fluid hydration IV Rocephin Will otherwise monitor

## 2022-10-05 NOTE — Assessment & Plan Note (Signed)
Continue home Plaquenil and chronic prednisone If blood pressure downtrends, may need stress dose steroids

## 2022-10-05 NOTE — ED Provider Notes (Signed)
[facility] Provider Note    Event Date/Time   First MD Initiated Contact with Patient 10/05/22 0405     (approximate)   History   Altered Mental Status and Fall   HPI  Carol Kent is a 82 y.o. female who presents to the ED for evaluation of Altered Mental Status and Fall   Patient presents to the ED from home via EMS after a fall.  Patient is confused and demented at baseline and cannot provide any relevant history.  Reports that she was trying to get to her car to visit her mother's house (her mother has long since passed).   Majority of history is provided by the daughter at the bedside when she arrives later.  Reports patient was going down the back steps, about 8 steps, from the porch and fell.  Used a life alert  Acknowledges her mother has been more confused than normal recently   Physical Exam   Triage Vital Signs: ED Triage Vitals  Enc Vitals Group     BP 10/05/22 0330 (!) 151/136     Pulse Rate 10/05/22 0330 93     Resp 10/05/22 0331 18     Temp 10/05/22 0346 98.2 F (36.8 C)     Temp src --      SpO2 10/05/22 0327 95 %     Weight 10/05/22 0332 160 lb (72.6 kg)     Height 10/05/22 0332 5\' 5"  (1.651 m)     Head Circumference --      Peak Flow --      Pain Score 10/05/22 0332 0     Pain Loc --      Pain Edu? --      Excl. in GC? --     Most recent vital signs: Vitals:   10/05/22 0600 10/05/22 0630  BP: (!) 118/47 (!) 106/49  Pulse: 93 91  Resp: (!) 24 (!) 21  Temp:    SpO2: 98% (!) 86%    General: Awake, no distress.  Pleasantly disoriented.  Follows commands in all 4 extremities. CV:  Good peripheral perfusion.  Resp:  Normal effort.  Clear Abd:  No distention.  Mild suprapubic tenderness is present. Otherwise benign MSK:  No deformity noted.  Skin tear to the right forearm Neuro:  No focal deficits appreciated. Cranial nerves II through XII intact 5/5 strength and sensation in all 4 extremities Other:     ED  Results / Procedures / Treatments   Labs (all labs ordered are listed, but only abnormal results are displayed) Labs Reviewed  CBC WITH DIFFERENTIAL/PLATELET - Abnormal; Notable for the following components:      Result Value   RBC 3.12 (*)    Hemoglobin 9.4 (*)    HCT 28.8 (*)    All other components within normal limits  BASIC METABOLIC PANEL - Abnormal; Notable for the following components:   Glucose, Bld 109 (*)    BUN 34 (*)    Creatinine, Ser 2.32 (*)    Calcium 8.4 (*)    GFR, Estimated 20 (*)    All other components within normal limits  URINALYSIS, ROUTINE W REFLEX MICROSCOPIC    EKG Sinus rhythm with a rate of 90 bpm.  Normal axis and intervals.  No clear signs of acute ischemia.  RADIOLOGY CT head interpreted by me without evidence of acute intracranial pathology CT cervical spine interpreted by me without evidence of fracture or dislocation CXR interpreted by me without  evidence of acute cardiopulmonary pathology. Plain film the right humerus interpreted by me without evidence of fracture or dislocation  Official radiology report(s): DG Chest Portable 1 View  Result Date: 10/05/2022 CLINICAL DATA:  Fall.  Dementia EXAM: PORTABLE CHEST 1 VIEW COMPARISON:  06/24/2022 FINDINGS: Normal heart size for rotation and portable technique. Generalized interstitial opacity. No visible effusion or pneumothorax. Normal heart size and mediastinal contours. There is biapical pleural based scarring with calcification. IMPRESSION: Generalized interstitial opacity similar to 06/24/2022, either chronic lung disease or interstitial edema. Electronically Signed   By: Tiburcio Pea M.D.   On: 10/05/2022 05:41   DG Humerus Right  Result Date: 10/05/2022 CLINICAL DATA:  Fall with pain. EXAM: RIGHT HUMERUS - 2 VIEW COMPARISON:  None Available. FINDINGS: There is no evidence of fracture or other focal bone lesions. Subjective osteopenia. Partial coverage of the chest reported separately.  IMPRESSION: Negative for fracture or dislocation of the humerus. Electronically Signed   By: Tiburcio Pea M.D.   On: 10/05/2022 05:39   CT HEAD WO CONTRAST ( )  Result Date: 10/05/2022 CLINICAL DATA:  Dementia with possible fall, found down outside. EXAM: CT HEAD WITHOUT CONTRAST CT CERVICAL SPINE WITHOUT CONTRAST TECHNIQUE: Multidetector CT imaging of the head and cervical spine was performed following the standard protocol without intravenous contrast. Multiplanar CT image reconstructions of the cervical spine were also generated. RADIATION DOSE REDUCTION: This exam was performed according to the departmental dose-optimization program which includes automated exposure control, adjustment of the mA and/or kV according to patient size and/or use of iterative reconstruction technique. COMPARISON:  06/22/2022 FINDINGS: CT HEAD FINDINGS Brain: No evidence of acute infarction, hemorrhage, hydrocephalus, extra-axial collection or mass lesion/mass effect. Generalized brain atrophy. Encephalomalacia in the anterior right temporal lobe and frontal operculum beneath the craniotomy flap. Vascular: No hyperdense vessel or unexpected calcification. Skull: Remote right pterional craniotomy. Sinuses/Orbits: No evidence of injury CT CERVICAL SPINE FINDINGS Alignment: Normal. Skull base and vertebrae: Chronic T2 and T3 compression fractures with healed appearance. No evidence of acute fracture or aggressive bone lesion. Soft tissues and spinal canal: No prevertebral fluid or swelling. No visible canal hematoma. Disc levels:  Ordinary degenerative endplate spurring. Upper chest: Biapical reticulation, there is pending chest radiograph. IMPRESSION: No evidence of acute intracranial or cervical spine injury. Electronically Signed   By: Tiburcio Pea M.D.   On: 10/05/2022 05:22   CT Cervical Spine Wo Contrast  Result Date: 10/05/2022 CLINICAL DATA:  Dementia with possible fall, found down outside. EXAM: CT HEAD WITHOUT  CONTRAST CT CERVICAL SPINE WITHOUT CONTRAST TECHNIQUE: Multidetector CT imaging of the head and cervical spine was performed following the standard protocol without intravenous contrast. Multiplanar CT image reconstructions of the cervical spine were also generated. RADIATION DOSE REDUCTION: This exam was performed according to the departmental dose-optimization program which includes automated exposure control, adjustment of the mA and/or kV according to patient size and/or use of iterative reconstruction technique. COMPARISON:  06/22/2022 FINDINGS: CT HEAD FINDINGS Brain: No evidence of acute infarction, hemorrhage, hydrocephalus, extra-axial collection or mass lesion/mass effect. Generalized brain atrophy. Encephalomalacia in the anterior right temporal lobe and frontal operculum beneath the craniotomy flap. Vascular: No hyperdense vessel or unexpected calcification. Skull: Remote right pterional craniotomy. Sinuses/Orbits: No evidence of injury CT CERVICAL SPINE FINDINGS Alignment: Normal. Skull base and vertebrae: Chronic T2 and T3 compression fractures with healed appearance. No evidence of acute fracture or aggressive bone lesion. Soft tissues and spinal canal: No prevertebral fluid or swelling. No visible canal hematoma.  Disc levels:  Ordinary degenerative endplate spurring. Upper chest: Biapical reticulation, there is pending chest radiograph. IMPRESSION: No evidence of acute intracranial or cervical spine injury. Electronically Signed   By: Tiburcio Pea M.D.   On: 10/05/2022 05:22    PROCEDURES and INTERVENTIONS:  .1-3 Lead EKG Interpretation  Performed by: Delton Prairie, MD Authorized by: Delton Prairie, MD     Interpretation: normal     ECG rate:  90   ECG rate assessment: normal     Rhythm: sinus rhythm     Ectopy: none     Conduction: normal     Medications  acetaminophen (TYLENOL) tablet 1,000 mg (1,000 mg Oral Given 10/05/22 0420)  lactated ringers bolus 1,000 mL (0 mLs Intravenous  Stopped 10/05/22 0658)     IMPRESSION / MDM / ASSESSMENT AND PLAN / ED COURSE  I reviewed the triage vital signs and the nursing notes.  Differential diagnosis includes, but is not limited to, AKI, dehydration, UTI, sepsis, ICH, humerus fracture  {Patient presents with symptoms of an acute illness or injury that is potentially life-threatening. Pleasantly disoriented 82 year old woman presents from home after a fall without evidence of acute traumatic pathology.  Blood work shows a mild prerenal azotemia for which she received a liter of LR.  CBC is normal with a chronic normocytic anemia and no leukocytosis.  Imaging is reassuring, as above.  Due to her increased confusion from her baseline and disorientation, mild suprapubic tenderness awaiting urinalysis to assess for cystitis contributing to her symptoms.  As below, family is comfortable with her going home and they will keep an eye on her to ensure she takes her antibiotics if this is the case.  Signed out to oncoming provider with the expectation of likely outpatient management, plus or minus antibiotics.   Clinical Course as of 10/05/22 0716  Wynelle Link Oct 05, 2022  4098 Reassessed and discussed plan of care with patient and daughter.  I removed patient's c-collar and she is appreciative.  She was noted to have a documented hypoxic saturation and I noticed this in the 86's right forearm with a c-collar and immediately normalized after remove the c-collar.  I suspect it was a positional discomfort related to this.  We will continue to monitor her saturations. [DS]  0711 We discussed creatinine and prerenal azotemia for which the patient received LR.  Discussed pending urinalysis and the possibility of cystitis.  Discussed disposition and daughter is comfortable the patient going home, she will stay with the patient to ensure she is okay and taking medications. [DS]  0715 Signed out to oncoming provider pending urinalysis [DS]    Clinical Course User  Index [DS] Delton Prairie, MD     FINAL CLINICAL IMPRESSION(S) / ED DIAGNOSES   Final diagnoses:  Fall, initial encounter  Prerenal azotemia     Rx / DC Orders   ED Discharge Orders     None        Note:  This document was prepared using Dragon voice recognition software and may include unintentional dictation errors.   Delton Prairie, MD 10/05/22 [phone]

## 2022-10-05 NOTE — Consult Note (Signed)
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 Followed by the outpatient wound care center by PA-C Provider Job Founds III. Last visit was on 08/2022 Wound type:trauma Pressure Injury POA: N/A Measurement: Per Mr. Hayden Rasmussen visit note on 09/18/22 Wound ZOX:WRUEAVW full and partial thickness wounds Drainage (amount, consistency, odor) small serous Periwound:edema, erythema, bruising Dressing procedure/placement/frequency: I will provide guidance for Nursing via the Orders for the topical care that has been in place consisting of moisture retentive and antimicrobial wound care using xeroform gauze followed by covering with ABD pads and securing with mild compression. IN the absence of TubiGrip #7, I will have Nursing wrap from just below toes to just below knee with Kerlix roll gauze and secure with 6-inch ACE bandages. LEs are to be placed into Prevalon boots for floatation of heels and a sacral foam is to be placed for PI prevention.  A pressure redistribution chair pad is provided for OOB I chair use and is to be sent with patient at discharge.  WOC nursing team will not follow, but will remain available to this patient, the nursing and medical teams.  Please re-consult if needed.  Thank you for inviting [place] to participate in this patient's Plan of Care.  Ladona Mow, MSN, RN, CNS, GNP, Leda Min, [employer], [employer] phone:  [phone]

## 2022-10-05 NOTE — ED Provider Notes (Signed)
82 yo F here with fall x 2 and generalized weakness. UA concerning for UTI. Pt Labs suggest mild dehydration. Pt weak, difficulty ambulating. Discussed with family, and pt, who feel more comfortable with admission for hydration and abx which I think is reasonable given her age, dehydration, living status, and falls x 2 in 24 hr.   Shaune Pollack, MD 10/05/22 [phone]

## 2022-10-05 NOTE — Assessment & Plan Note (Signed)
2D echo May 2024 with a EF of 60 to 65% Looks mildly dry on exam though with some dependent lower extremity edema Chest x-ray stable Will give gentle IV fluid hydration in setting of dehydration Monitor volume status closely

## 2022-10-05 NOTE — Assessment & Plan Note (Signed)
Positive fall at home in the setting of dehydration, UTI mild confusion Baseline dementia is a confounding issue CT head, CT C-spine, humerus plain films stable IV fluid hydration Treat UTI PT OT evaluation Fall precautions Follow

## 2022-10-05 NOTE — Evaluation (Signed)
Physical Therapy Evaluation Patient Details Name: Carol Kent MRN: [id number] DOB: April 28, 1940 Today's Date: 10/05/2022  History of Present Illness  Pt admitted to [facility] on 10/05/22 for c/o potential fall and AMS. UA concerning for UTI. Imaging negative for acute fracture. Significant PMH includes: dementia, HFpEF, CKD (IV), RA, venous stasis dermatitis, B12 deficiency, AKI.   Clinical Impression  Pt is a 82 year old F admitted to hospital on 10/05/22 for UTI. PLOF and home set up obtained from daughter at bedside. At baseline, pt requires supervision for bathing and assist for LB dressing, cleaning, cooking, medication management, and transportation. She is able to be mod I for toileting and UB dressing. She does not use AD for ambulation, and will use walls/furniture for steadying. Hx of multiple falls.  Pt presents with decreased dynamic standing balance, impaired safety awareness, decreased activity tolerance, impaired skin integrity, and gait deficits, resulting in impaired functional mobility. Due to deficits, pt required mod I for bed mobility, CGA for transfers without AD, and CGA to ambulate 13ft x2 without AD. Gait deficits, impaired safety awareness, and decreased standing balance increase her risk of falls.  Deficits limit the pt's ability to safely and independently perform ADL's, transfer, and ambulate. Pt with fair rehab prognosis due to dementia. Pt will benefit from acute skilled PT services to address fall risk deficits and for improved safety with functional mobility prior to DC. Pt has been using a transport WC that was borrowed from friend; therefore, recommending transport WC for safe community mobility with family.         Assistance Recommended at Discharge Frequent or constant Supervision/Assistance  If plan is discharge home, recommend the following:  Can travel by private vehicle  A little help with walking and/or transfers;A little help with  bathing/dressing/bathroom;Assistance with cooking/housework;Direct supervision/assist for medications management;Direct supervision/assist for financial management;Assist for transportation;Help with stairs or ramp for entrance   Yes    Equipment Recommendations  (transport WC)     Functional Status Assessment Patient has had a recent decline in their functional status and demonstrates the ability to make significant improvements in function in a reasonable and predictable amount of time.     Precautions / Restrictions Precautions Precautions: Fall Precaution Comments: O2 >/= 92%      Mobility  Bed Mobility Overal bed mobility: Modified Independent             General bed mobility comments: for supine<>sit transfer, HOB elevated, use of BUE for support; increased time/effort    Transfers Overall transfer level: Needs assistance   Transfers: Sit to/from Stand Sit to Stand: Min guard           General transfer comment: CGA for safety to stand from EOB and low height commode without AD, noted to be reaching outside BOS for steadying. Declining to use AD.    Ambulation/Gait Ambulation/Gait assistance: Min guard Gait Distance (Feet): 20 Feet (39ft x2)           General Gait Details: CGA for safety to ambulate from EOB<>bathroom for brief change and toileting. Demonstrates forward flexed posture, reaching outside BOS for steadying, decreased step length/foot clearance bil, and narrow BOS. Daughter notes gait is at baseline.      Balance Overall balance assessment: Needs assistance   Sitting balance-Leahy Scale: Normal Sitting balance - Comments: able to bend forward to don brief and pants without LOB   Standing balance support: During functional activity Standing balance-Leahy Scale: Good Standing balance comment: "Good" static as she  was able to bend forward to retrieve brief from the ground, no LOB. CGA for safety. "Fair" dynamic as she requires at least UUE  support on walls/furniture for steadying.                             Pertinent Vitals/Pain Pain Assessment Pain Assessment: No/denies pain    Home Living Family/patient expects to be discharged to:: Private residence Living Arrangements: Alone Available Help at Discharge: Family;Available PRN/intermittently (daughter Selena Batten); adult grandsons) Type of Home: House Home Access: Stairs to enter Entrance Stairs-Rails: Can reach both Entrance Stairs-Number of Steps: ramp (front entrance); 7+1 steps (back entrance)   Home Layout: One level Home Equipment: Rollator (4 wheels);BSC/3in1;Hand held shower head;Shower seat;Transport chair Additional Comments: borrowing transport chair from friend; Management consultant; life alert    Prior Function Prior Level of Function : Needs assist             Mobility Comments: PLOF obtained from daughter at bedside. Per daughter report, pt has AD but will not use them for household ambulation. She uses walls/furniture for steadying in the home. ADLs Comments: Supervision for safety with bathing, but pt able to be IND with toileting and UB dressing. Assist for LB dressing due to concern for falling when bending forward. Assist with IADL's, but states pt is able to do dishes and some light cleaning/tidying up.        Extremity/Trunk Assessment   Upper Extremity Assessment Upper Extremity Assessment: Overall WFL for tasks assessed (not formally assessed due to difficulty following commands; observed to be at least 3+/5 as pt was able to WB through UE for transfers and gait without buckling)    Lower Extremity Assessment Lower Extremity Assessment: Overall WFL for tasks assessed (not formally assessed due to difficulty following commands; observed to be at least 3+/5 as pt was able to WB through UE for transfers and gait without buckling)       Communication   Communication: No difficulties  Cognition Arousal/Alertness:  Awake/alert Behavior During Therapy: WFL for tasks assessed/performed Overall Cognitive Status: History of cognitive impairments - at baseline                                 General Comments: daughter notes pt's cognition is not at baseline        General Comments General comments (skin integrity, edema, etc.): significant edema to BLE with stockings rolled to ankles; daughter reports this was done by [place] team when performing doppler US to r/o DVT. Daughter expresses concern that stockings were left down due to pt edema and poor skin integrity. Stockings adjusted appropriately and socks doffed at end of session, RN and care team notified.    Exercises Other Exercises Other Exercises: Participates in bed mobility, transfers, gait, toileting, brief change, and donning pants. Other Exercises: Pt and family educated re: PT role/POC, DC recommendations, DME recommendations, limitations from dementia, LTC, memory unit at facility, safety with functional mobility.   Assessment/Plan    PT Assessment Patient needs continued PT services  PT Problem List Decreased activity tolerance;Decreased balance;Decreased mobility;Decreased cognition;Decreased safety awareness;Decreased skin integrity       PT Treatment Interventions DME instruction;Gait training;Stair training;Functional mobility training;Therapeutic activities;Therapeutic exercise;Balance training;Neuromuscular re-education;Patient/family education    PT Goals (Current goals can be found in the Care Plan section)  Acute Rehab PT Goals Patient Stated Goal: none stated  Frequency Min 2X/week        AM-PAC PT "6 Clicks" Mobility  Outcome Measure Help needed turning from your back to your side while in a flat bed without using bedrails?: None Help needed moving from lying on your back to sitting on the side of a flat bed without using bedrails?: None Help needed moving to and from a bed to a chair (including a  wheelchair)?: A Little Help needed standing up from a chair using your arms (e.g., wheelchair or bedside chair)?: A Little Help needed to walk in hospital room?: A Little Help needed climbing 3-5 steps with a railing? : A Little 6 Click Score: 20    End of Session Equipment Utilized During Treatment: Gait belt Activity Tolerance: Patient tolerated treatment well Patient left: in bed;with call bell/phone within reach;with family/visitor present (chair alarm left off; family appropriate to mobilize pt in bedroom for toileting; family educated to notify RN when leaving for bed alarm) Nurse Communication: Mobility status PT Visit Diagnosis: Unsteadiness on feet (R26.81);Repeated falls (R29.6);History of falling (Z91.81);Difficulty in walking, not elsewhere classified (R26.2)    Time: 8119-1478 PT Time Calculation (min) (ACUTE ONLY): 37 min   Charges:   PT Evaluation $PT Eval Low Complexity: 1 Low PT Treatments $Therapeutic Activity: 8-22 mins PT General Charges $$ ACUTE PT VISIT: 1 Visit        Vira Blanco, PT, DPT 3:50 PM,10/05/22 Physical Therapist - [facility]

## 2022-10-05 NOTE — ED Notes (Signed)
Advised nurse that patient has ready bed 

## 2022-10-05 NOTE — H&P (Signed)
History and Physical    Patient: Carol Kent WUJ:[id number] DOB: [birth year] DOA: 10/05/2022 DOS: the patient was seen and examined on 10/05/2022 PCP: [name], MD  Patient coming from: Home  Chief Complaint:  Chief Complaint  Patient presents with   Altered Mental Status   Fall   HPI: Carol Kent is a 82 y.o. female with medical history significant of dementia, HFpEF, stage IV CKD, rheumatoid arthritis presenting with fall, encephalopathy, UTI, dehydration, acute on chronic kidney injury.  History primarily per patient's daughter in the setting of encephalopathy and baseline dementia.  Per report, patient found down outside at home.  Patient is medical alert bracelet was alarmed.  There was no response by the EMS team.  Per the daughter, patient lives at home alone though family is very nearby.  Reports having some generalized mild confusion and fatigue over the past 2 days above baseline.  No reported fevers or chills.  No reported nausea or vomiting.  Baseline dementia.  Has family check on her on a fairly regular basis.  Both daughters work with EMS per report.  Family unaware of the exact time that patient was down. Presented to the ER afebrile, hemodynamically stable.  Satting well on room air.  White count 7.3, hemoglobin 9.4, platelets 171, creatinine 2.32 with baseline creatinine around 1.8-2.  Urinalysis indicative of infection.  Imaging including CT head, CT C-spine, humerus and chest x-ray grossly stable. Review of Systems: As mentioned in the history of present illness. All other systems reviewed and are negative. Past Medical History:  Diagnosis Date   Brain aneurysm    CHF (congestive heart failure) (HCC)    Chronic kidney disease    History of cervical fracture    Past Surgical History:  Procedure Laterality Date   COLONOSCOPY WITH PROPOFOL N/A 11/14/2019   Procedure: COLONOSCOPY WITH PROPOFOL;  Surgeon: Wyline Mood, MD;  Location: [facility] ENDOSCOPY;  Service:  Gastroenterology;  Laterality: N/A;   TUBAL LIGATION     Social History:  reports that she has never smoked. She has never used smokeless tobacco. She reports that she does not drink alcohol and does not use drugs.  Allergies  Allergen Reactions   Codeine Nausea Only    Other reaction(s): Nausea   Erythromycin Nausea Only   Fentanyl     Other reaction(s): Hallucination   Morphine And Codeine     Other reaction(s): Other (See Comments)   Orencia [Abatacept]    Propoxyphene Nausea Only    Other reaction(s): Nausea   Sulfa Antibiotics     Other reaction(s): Nausea   Other Rash    Pain med - can not remember name   Penicillins Rash    Other reaction(s): Hives    History reviewed. No pertinent family history.  Prior to Admission medications   Medication Sig Start Date End Date Taking? Authorizing Provider  albuterol (VENTOLIN HFA) 108 (90 Base) MCG/ACT inhaler Inhale 2 puffs into the lungs every 6 (six) hours as needed for wheezing or shortness of breath. Patient not taking: Reported on 07/09/2022 11/07/20   Maretta Bees, MD  Ascorbic Acid (VITAMIN C) 100 MG tablet Take 100 mg by mouth daily.    [provider]  bacitracin 500 UNIT/GM ointment Apply 1 Application topically 2 (two) times daily. Patient not taking: Reported on 08/29/2022 06/22/22   Georga Hacking, MD  bismuth subsalicylate (PEPTO BISMOL) 262 MG/15ML suspension Take 30 mLs by mouth every 4 (four) hours as needed for diarrhea or loose  stools or indigestion. Patient not taking: Reported on 07/09/2022    [provider]  Bismuth Tribromoph-Petrolatum (XEROFORM PETROLAT PATCH 4"X4") PADS Apply 1 patch topically daily. 06/22/22 11/07/30  Georga Hacking, MD  EPINEPHrine 0.3 mg/0.3 mL IJ SOAJ injection Inject 0.3 mg into the skin as directed. 06/07/20   [provider]  furosemide (LASIX) 40 MG tablet Take 1 tablet by mouth 3 times a week. 08/07/22   [name], NP  hydroxychloroquine  (PLAQUENIL) 200 MG tablet Take 200 mg by mouth in the morning and at bedtime. 01/19/20   [provider]  KLOR-CON M10 10 MEQ tablet Take 10 mEq by mouth daily. 07/14/20   [provider]  memantine (NAMENDA) 10 MG tablet Take 10 mg by mouth 2 (two) times daily. 04/27/22   [provider]  mirtazapine (REMERON) 7.5 MG tablet Take 7.5 mg by mouth at bedtime. 05/06/22   [provider]  ondansetron (ZOFRAN ODT) 4 MG disintegrating tablet Take 1 tablet (4 mg total) by mouth every 8 (eight) hours as needed for nausea or vomiting. Patient not taking: Reported on 07/09/2022 11/07/20   Maretta Bees, MD  predniSONE (DELTASONE) 1 MG tablet Take 2 mg by mouth daily. Take along with one 5 mg tablet for total 7 mg once daily as directed 05/03/22   [provider]  predniSONE (DELTASONE) 5 MG tablet Take 20 mg (4 tablets) p.o. daily for 2 days, then 15 mg (3 tablets) p.o. daily for 2 days, then 10 mg (2 tablets) p.o. daily for 2 days, and then resume usual regimen of 7.5 mg p.o. daily Patient taking differently: Take 7 mg by mouth daily with breakfast. 11/07/20   Ghimire, Werner Lean, MD  vitamin B-12 1000 MCG tablet Take 1 tablet (1,000 mcg total) by mouth daily. 11/18/19   Alford Highland, MD    Physical Exam: Vitals:   10/05/22 0700 10/05/22 0825 10/05/22 0930 10/05/22 0957  BP: (!) 104/53  (!) 110/48 100/63  Pulse: 85  78 84  Resp: 15  20 18   Temp:  97.9 F (36.6 C)  97.8 F (36.6 C)  TempSrc:  Oral    SpO2: 99%  100% 100%  Weight:      Height:       Physical Exam Constitutional:      Appearance: She is normal weight.  HENT:     Head: Normocephalic and atraumatic.     Nose: Nose normal.     Mouth/Throat:     Mouth: Mucous membranes are moist.  Eyes:     Pupils: Pupils are equal, round, and reactive to light.  Cardiovascular:     Rate and Rhythm: Normal rate and regular rhythm.  Pulmonary:     Effort: Pulmonary effort is normal.  Abdominal:      General: Abdomen is flat. Bowel sounds are normal.  Musculoskeletal:     Comments: Positive lower extremity venous stasis changes and 1-2+ pitting edema  Neurological:     Comments: Positive generalized confusion Otherwise grossly nonfocal neuroexam  Psychiatric:        Mood and Affect: Mood normal.     Data Reviewed:  There are no new results to review at this time. DG Chest Portable 1 View CLINICAL DATA:  Fall.  Dementia  EXAM: PORTABLE CHEST 1 VIEW  COMPARISON:  06/24/2022  FINDINGS: Normal heart size for rotation and portable technique. Generalized interstitial opacity. No visible effusion or pneumothorax. Normal heart size and mediastinal contours. There is biapical  pleural based scarring with calcification.  IMPRESSION: Generalized interstitial opacity similar to 06/24/2022, either chronic lung disease or interstitial edema.  Electronically Signed   By: Tiburcio Pea M.D.   On: 10/05/2022 05:41 DG Humerus Right CLINICAL DATA:  Fall with pain.  EXAM: RIGHT HUMERUS - 2 VIEW  COMPARISON:  None Available.  FINDINGS: There is no evidence of fracture or other focal bone lesions. Subjective osteopenia. Partial coverage of the chest reported separately.  IMPRESSION: Negative for fracture or dislocation of the humerus.  Electronically Signed   By: Tiburcio Pea M.D.   On: 10/05/2022 05:39 CT Cervical Spine Wo Contrast CLINICAL DATA:  Dementia with possible fall, found down outside.  EXAM: CT HEAD WITHOUT CONTRAST  CT CERVICAL SPINE WITHOUT CONTRAST  TECHNIQUE: Multidetector CT imaging of the head and cervical spine was performed following the standard protocol without intravenous contrast. Multiplanar CT image reconstructions of the cervical spine were also generated.  RADIATION DOSE REDUCTION: This exam was performed according to the departmental dose-optimization program which includes automated exposure control, adjustment of the mA and/or kV  according to patient size and/or use of iterative reconstruction technique.  COMPARISON:  06/22/2022  FINDINGS: CT HEAD FINDINGS  Brain: No evidence of acute infarction, hemorrhage, hydrocephalus, extra-axial collection or mass lesion/mass effect. Generalized brain atrophy. Encephalomalacia in the anterior right temporal lobe and frontal operculum beneath the craniotomy flap.  Vascular: No hyperdense vessel or unexpected calcification.  Skull: Remote right pterional craniotomy.  Sinuses/Orbits: No evidence of injury  CT CERVICAL SPINE FINDINGS  Alignment: Normal.  Skull base and vertebrae: Chronic T2 and T3 compression fractures with healed appearance. No evidence of acute fracture or aggressive bone lesion.  Soft tissues and spinal canal: No prevertebral fluid or swelling. No visible canal hematoma.  Disc levels:  Ordinary degenerative endplate spurring.  Upper chest: Biapical reticulation, there is pending chest radiograph.  IMPRESSION: No evidence of acute intracranial or cervical spine injury.  Electronically Signed   By: Tiburcio Pea M.D.   On: 10/05/2022 05:22 CT HEAD WO CONTRAST ( ) CLINICAL DATA:  Dementia with possible fall, found down outside.  EXAM: CT HEAD WITHOUT CONTRAST  CT CERVICAL SPINE WITHOUT CONTRAST  TECHNIQUE: Multidetector CT imaging of the head and cervical spine was performed following the standard protocol without intravenous contrast. Multiplanar CT image reconstructions of the cervical spine were also generated.  RADIATION DOSE REDUCTION: This exam was performed according to the departmental dose-optimization program which includes automated exposure control, adjustment of the mA and/or kV according to patient size and/or use of iterative reconstruction technique.  COMPARISON:  06/22/2022  FINDINGS: CT HEAD FINDINGS  Brain: No evidence of acute infarction, hemorrhage, hydrocephalus, extra-axial collection or mass  lesion/mass effect. Generalized brain atrophy. Encephalomalacia in the anterior right temporal lobe and frontal operculum beneath the craniotomy flap.  Vascular: No hyperdense vessel or unexpected calcification.  Skull: Remote right pterional craniotomy.  Sinuses/Orbits: No evidence of injury  CT CERVICAL SPINE FINDINGS  Alignment: Normal.  Skull base and vertebrae: Chronic T2 and T3 compression fractures with healed appearance. No evidence of acute fracture or aggressive bone lesion.  Soft tissues and spinal canal: No prevertebral fluid or swelling. No visible canal hematoma.  Disc levels:  Ordinary degenerative endplate spurring.  Upper chest: Biapical reticulation, there is pending chest radiograph.  IMPRESSION: No evidence of acute intracranial or cervical spine injury.  Electronically Signed   By: Tiburcio Pea M.D.   On: 10/05/2022 05:22  Lab Results  Component Value Date  WBC 7.3 10/05/2022   HGB 9.4 (L) 10/05/2022   HCT 28.8 (L) 10/05/2022   MCV 92.3 10/05/2022   PLT 171 10/05/2022   Last metabolic panel Lab Results  Component Value Date   GLUCOSE 109 (H) 10/05/2022   NA 139 10/05/2022   K 3.6 10/05/2022   CL 107 10/05/2022   CO2 23 10/05/2022   BUN 34 (H) 10/05/2022   CREATININE 2.32 (H) 10/05/2022   GFRNONAA 20 (L) 10/05/2022   CALCIUM 8.4 (L) 10/05/2022   PHOS 3.6 05/30/2022   PROT 6.1 (L) 06/24/2022   ALBUMIN 2.8 (L) 06/24/2022   BILITOT 0.9 06/24/2022   ALKPHOS 59 06/24/2022   AST 23 06/24/2022   ALT 18 06/24/2022   ANIONGAP 9 10/05/2022    Assessment and Plan: * UTI (urinary tract infection) Urinalysis indicative of infection with noted recurrent falls confusion at home as well as dehydration IV Rocephin Urine culture Follow-up  Falls Positive fall at home in the setting of dehydration, UTI mild confusion Baseline dementia is a confounding issue CT head, CT C-spine, humerus plain films stable IV fluid hydration Treat UTI PT  OT evaluation Fall precautions Follow  Acute metabolic encephalopathy Decompensated baseline dementia with confusion in the setting of dehydration, UTI, AKI CT head stable IV fluid hydration IV Rocephin Will otherwise monitor  Acute kidney injury superimposed on CKD (HCC) Creatinine 2.3 today with a baseline creatinine around 1-2 GFR 20 Mildly dry Gentle IV fluid hydration in setting of HFpEF Hold nephrotoxic agents acutely Monitor  Venous stasis dermatitis Positive venous stasis changes in lower extremities bilaterally TED hose in place Mild tenderness palpation Will check lower extremity ultrasound to rule out DVT Wound care consult Follow  (HFpEF) heart failure with preserved ejection fraction (HCC) 2D echo May 2024 with a EF of 60 to 65% Looks mildly dry on exam though with some dependent lower extremity edema Chest x-ray stable Will give gentle IV fluid hydration in setting of dehydration Monitor volume status closely  Rheumatoid arthritis (HCC) Continue home Plaquenil and chronic prednisone If blood pressure downtrends, may need stress dose steroids   Greater than 50% was spent in counseling and coordination of care with patient Total encounter time 80 minutes or more     Advance Care Planning:   Code Status: Full Code   Consults: None   Family Communication: No family at the bedside   Severity of Illness: The appropriate patient status for this patient is INPATIENT. Inpatient status is judged to be reasonable and necessary in order to provide the required intensity of service to ensure the patient's safety. The patient's presenting symptoms, physical exam findings, and initial radiographic and laboratory data in the context of their chronic comorbidities is felt to place them at high risk for further clinical deterioration. Furthermore, it is not anticipated that the patient will be medically stable for discharge from the hospital within 2 midnights of  admission.   * I certify that at the point of admission it is my clinical judgment that the patient will require inpatient hospital care spanning beyond 2 midnights from the point of admission due to high intensity of service, high risk for further deterioration and high frequency of surveillance required.*  Author: Floydene Flock, MD 10/05/2022 10:34 AM  For on call review [email or web address].

## 2022-10-05 NOTE — Assessment & Plan Note (Signed)
Positive venous stasis changes in lower extremities bilaterally TED hose in place Mild tenderness palpation Will check lower extremity ultrasound to rule out DVT Wound care consult Follow

## 2022-10-06 ENCOUNTER — Inpatient Hospital Stay: Payer: Medicare Other

## 2022-10-06 DIAGNOSIS — I10 Essential (primary) hypertension: Secondary | ICD-10-CM | POA: Insufficient documentation

## 2022-10-06 LAB — CBC
HCT: 28.2 % — ABNORMAL LOW (ref 36.0–46.0)
Hemoglobin: 9.1 g/dL — ABNORMAL LOW (ref 12.0–15.0)
MCH: 30.1 pg (ref 26.0–34.0)
MCHC: 32.3 g/dL (ref 30.0–36.0)
MCV: 93.4 fL (ref 80.0–100.0)
Platelets: 154 10*3/uL (ref 150–400)
RBC: 3.02 MIL/uL — ABNORMAL LOW (ref 3.87–5.11)
RDW: 14.6 % (ref 11.5–15.5)
WBC: 5.7 10*3/uL (ref 4.0–10.5)
nRBC: 0 % (ref 0.0–0.2)

## 2022-10-06 LAB — COMPREHENSIVE METABOLIC PANEL
ALT: 12 U/L (ref 0–44)
AST: 32 U/L (ref 15–41)
Albumin: 2.6 g/dL — ABNORMAL LOW (ref 3.5–5.0)
Alkaline Phosphatase: 56 U/L (ref 38–126)
Anion gap: 7 (ref 5–15)
BUN: 25 mg/dL — ABNORMAL HIGH (ref 8–23)
CO2: 22 mmol/L (ref 22–32)
Calcium: 7.8 mg/dL — ABNORMAL LOW (ref 8.9–10.3)
Chloride: 112 mmol/L — ABNORMAL HIGH (ref 98–111)
Creatinine, Ser: 1.75 mg/dL — ABNORMAL HIGH (ref 0.44–1.00)
GFR, Estimated: 28 mL/min — ABNORMAL LOW (ref >60)
Glucose, Bld: 99 mg/dL (ref 70–99)
Potassium: 3.9 mmol/L (ref 3.5–5.1)
Sodium: 141 mmol/L (ref 135–145)
Total Bilirubin: 0.6 mg/dL (ref 0.3–1.2)
Total Protein: 5.1 g/dL — ABNORMAL LOW (ref 6.5–8.1)

## 2022-10-06 LAB — URINE CULTURE: Culture: >=100000 — AB

## 2022-10-06 MED ORDER — PREDNISONE 5 MG PO TABS
6.0000 mg | ORAL_TABLET | Freq: Every day | ORAL | Status: DC
  Administered 2022-10-07 – 2022-10-08 (×2): 6 mg via ORAL
  Filled 2022-10-06 (×2): qty 1

## 2022-10-06 MED ORDER — HALOPERIDOL LACTATE 5 MG/ML IJ SOLN
5.0000 mg | Freq: Once | INTRAMUSCULAR | Status: AC | PRN
  Administered 2022-10-06: 5 mg via INTRAVENOUS
  Filled 2022-10-06: qty 1

## 2022-10-06 MED ORDER — VITAMIN B-12 1000 MCG PO TABS
1000.0000 ug | ORAL_TABLET | Freq: Every day | ORAL | Status: DC
  Administered 2022-10-07 – 2022-10-08 (×2): 1000 ug via ORAL
  Filled 2022-10-06 (×2): qty 1

## 2022-10-06 MED ORDER — MIRTAZAPINE 15 MG PO TABS
7.5000 mg | ORAL_TABLET | Freq: Every day | ORAL | Status: DC
  Administered 2022-10-06 – 2022-10-07 (×2): 7.5 mg via ORAL
  Filled 2022-10-06 (×2): qty 1

## 2022-10-06 MED ORDER — FUROSEMIDE 40 MG PO TABS
40.0000 mg | ORAL_TABLET | Freq: Every day | ORAL | Status: DC
  Administered 2022-10-07 – 2022-10-08 (×2): 40 mg via ORAL
  Filled 2022-10-06 (×2): qty 1

## 2022-10-06 MED ORDER — HALOPERIDOL LACTATE 5 MG/ML IJ SOLN
2.0000 mg | Freq: Four times a day (QID) | INTRAMUSCULAR | Status: DC | PRN
  Administered 2022-10-07 – 2022-10-08 (×2): 2 mg via INTRAVENOUS
  Filled 2022-10-06 (×3): qty 1

## 2022-10-06 MED ORDER — POTASSIUM CHLORIDE CRYS ER 10 MEQ PO TBCR
10.0000 meq | EXTENDED_RELEASE_TABLET | Freq: Every day | ORAL | Status: DC
  Administered 2022-10-07 – 2022-10-08 (×2): 10 meq via ORAL
  Filled 2022-10-06 (×2): qty 1

## 2022-10-06 NOTE — Evaluation (Signed)
Occupational Therapy Evaluation Patient Details Name: Carol Kent MRN: [id number] DOB: [birth year] Today's Date: 10/06/2022   History of Present Illness Pt admitted to [facility] on 10/05/22 for c/o potential fall and AMS. UA concerning for UTI. Imaging negative for acute fracture. Significant PMH includes: dementia, HFpEF, CKD (IV), RA, venous stasis dermatitis, B12 deficiency, AKI.   Clinical Impression   Carol Kent was seen for OT evaluation this date. Prior to hospital admission, pt was MOD I for functional mobility, and required assist from family for some ADL management including tub/shower transfers and LB dressing. Pt lives alone in a 1 level home with a ramped entrance. Her daughter check on her frequently, but works during the day/evening and is unable to provide 24/7 assist. Pt presents to acute OT demonstrating impaired ADL performance and functional mobility 2/2 generalized weakness, decreased safety awareness, decreased cognition, and limited balance (See OT problem list for additional functional deficits). Pt currently requires MIN A for bed/functional mobility, close min guard with cueing to take steps in room, and SUPERVISION assist for seated UB ADL management.  Pt would benefit from skilled OT services to address noted impairments and functional limitations (see below for any additional details) in order to maximize safety and independence while minimizing falls risk and caregiver burden. Do Anticipate the need for follow up OT services upon acute hospital DC.       Recommendations for follow up therapy are one component of a multi-disciplinary discharge planning process, led by the attending physician.  Recommendations may be updated based on patient status, additional functional criteria and insurance authorization.   Assistance Recommended at Discharge Frequent or constant Supervision/Assistance  Patient can return home with the following A little help with walking and/or  transfers;A lot of help with bathing/dressing/bathroom;Assistance with cooking/housework;Assist for transportation;Help with stairs or ramp for entrance;Assistance with feeding;Direct supervision/assist for financial management;Direct supervision/assist for medications management    Functional Status Assessment  Patient has had a recent decline in their functional status and demonstrates the ability to make significant improvements in function in a reasonable and predictable amount of time.  Equipment Recommendations  Tub/shower bench    Recommendations for Other Services       Precautions / Restrictions Precautions Precautions: Fall Precaution Comments: O2 >/= 92% Restrictions Weight Bearing Restrictions: No      Mobility Bed Mobility Overal bed mobility: Needs Assistance Bed Mobility: Supine to Sit     Supine to sit: Min assist, HOB elevated     General bed mobility comments: MIN A for trunk eelvation    Transfers Overall transfer level: Needs assistance   Transfers: Sit to/from Stand, Bed to chair/wheelchair/BSC Sit to Stand: Min assist     Step pivot transfers: Min guard     General transfer comment: MIN A to power up to stand with RW. Min guard during step pivot t/f with assist to advance RW and cueing for sequencing.      Balance     Sitting balance-Leahy Scale: Good Sitting balance - Comments: Steady static sitting, reacing inside BOS.   Standing balance support: During functional activity, Reliant on assistive device for balance, Bilateral upper extremity supported Standing balance-Leahy Scale: Fair                             ADL either performed or assessed with clinical judgement   ADL Overall ADL's : Needs assistance/impaired  Functional mobility during ADLs: Minimal assistance;Rolling walker (2 wheels) General ADL Comments: Functionally limited by impaired cognition, generalized  weakness, decreased safety awareness, and balance. Requires MIN A for bed mobiltiy and STS t/fs. Requires min guard with assist to advance RW during simulated toilet transfer. SUPERVISION assist with cueing for sequencing with seated UB ADL management including feeding and grooming.     Vision Patient Visual Report: No change from baseline       Perception     Praxis      Pertinent Vitals/Pain Pain Assessment Pain Assessment: No/denies pain     Hand Dominance     Extremity/Trunk Assessment Upper Extremity Assessment Upper Extremity Assessment: Generalized weakness   Lower Extremity Assessment Lower Extremity Assessment: Generalized weakness       Communication Communication Communication: No difficulties   Cognition Arousal/Alertness: Awake/alert Behavior During Therapy: WFL for tasks assessed/performed Overall Cognitive Status: No family/caregiver present to determine baseline cognitive functioning                                 General Comments: daughter notes pt's cognition is not at baseline, she was slow to wake up this morning, and seems to need more cueing than usual to complete functional tasks including toileting, etc. Required multimodal cueing for safety t/o functional transfer and is noted with difficulty with motor planning/sequeincing mobility.     General Comments       Exercises Other Exercises Other Exercises: Pt/caregiver educated on safety, falls prevention strategies, safe use of AE/DME for ADL management, DC recs, and routines modifications to support safety and functional independence.   Shoulder Instructions      Home Living Family/patient expects to be discharged to:: Private residence Living Arrangements: Alone Available Help at Discharge: Family;Available PRN/intermittently (Dtr and adult grandchildren) Type of Home: House Home Access: Stairs to enter [employer] of Steps: ramp (front entrance); 7+1 steps (back  entrance) Entrance Stairs-Rails: Can reach both Home Layout: One level     Bathroom Shower/Tub: Tub/shower unit (Per dtr, tub is high stepover clearnce)   Bathroom Toilet: Standard (with riser)     Home Equipment: Rollator (4 wheels);BSC/3in1;Hand held shower head;Shower seat;Transport chair   Additional Comments: borrowing transport chair from friend; Management consultant; life alert      Prior Functioning/Environment Prior Level of Function : Needs assist       Physical Assist : ADLs (physical)   ADLs (physical): IADLs Mobility Comments: PLOF obtained from daughter at bedside. Per daughter report, pt has AD but will not use them for household ambulation. She uses walls/furniture for steadying in the home. ADLs Comments: Supervision for safety with bathing, but pt able to be IND with toileting and UB dressing. Assist for LB dressing due to concern for falling when bending forward. Assist with IADL's, but states pt is able to do dishes and some light cleaning/tidying up.        OT Problem List: Decreased strength;Decreased coordination;Decreased range of motion;Decreased activity tolerance;Decreased safety awareness;Decreased cognition;Impaired balance (sitting and/or standing);Decreased knowledge of use of DME or AE      OT Treatment/Interventions: Self-care/ADL training;Therapeutic exercise;Therapeutic activities;DME and/or AE instruction;Patient/family education;Balance training;Energy conservation    OT Goals(Current goals can be found in the care plan section) Acute Rehab OT Goals Patient Stated Goal: To feel better OT Goal Formulation: With patient/family Time For Goal Achievement: 10/20/22 Potential to Achieve Goals: Good ADL Goals Pt Will Perform Grooming: standing;with min  assist Pt Will Transfer to Toilet: bedside commode;ambulating;with min assist Pt Will Perform Toileting - Clothing Manipulation and hygiene: sit to/from stand;with min assist;with adaptive  equipment  OT Frequency: Min 1X/week    Co-evaluation              AM-PAC OT "6 Clicks" Daily Activity     Outcome Measure Help from another person eating meals?: A Little Help from another person taking care of personal grooming?: A Little Help from another person toileting, which includes using toliet, bedpan, or urinal?: A Little Help from another person bathing (including washing, rinsing, drying)?: A Lot Help from another person to put on and taking off regular upper body clothing?: A Little Help from another person to put on and taking off regular lower body clothing?: A Lot 6 Click Score: 16   End of Session Equipment Utilized During Treatment: Gait belt;Rolling walker (2 wheels)  Activity Tolerance: Patient tolerated treatment well Patient left: in chair;with call bell/phone within reach;with chair alarm set  OT Visit Diagnosis: Other abnormalities of gait and mobility (R26.89);Muscle weakness (generalized) (M62.81)                Time: 1610-9604 OT Time Calculation (min): 35 min Charges:  OT General Charges $OT Visit: 1 Visit OT Evaluation $OT Eval Moderate Complexity: 1 Mod OT Treatments $Self Care/Home Management : 8-22 mins  Rockney Ghee, M.S., OTR/L 10/06/22, 12:04 PM

## 2022-10-06 NOTE — Progress Notes (Signed)
PROGRESS NOTE    Carol Kent  ZOX:[id number] DOB: 1940-08-16 DOA: 10/05/2022 PCP: [name], MD  Outpatient Specialists: neurology, nephrology    Brief Narrative:   From admission h and p   Carol Kent is a 82 y.o. female with medical history significant of dementia, HFpEF, stage IV CKD, rheumatoid arthritis presenting with fall, encephalopathy, UTI, dehydration, acute on chronic kidney injury.  History primarily per patient's daughter in the setting of encephalopathy and baseline dementia.  Per report, patient found down outside at home.  Patient is medical alert bracelet was alarmed.  There was no response by the EMS team.  Per the daughter, patient lives at home alone though family is very nearby.  Reports having some generalized mild confusion and fatigue over the past 2 days above baseline.  No reported fevers or chills.  No reported nausea or vomiting.  Baseline dementia.  Has family check on her on a fairly regular basis.  Both daughters work with EMS per report.  Family unaware of the exact time that patient was down.    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 Ct of c spine, head, right humerus, chest no fracture. Ck wnl - will check x-ray of right knee  # Dysarthria With confusion. Possibly 2/2 uti - will check mri brain  # Acute cystitis Ua consistent with bacteriuria. Daughter reports relatively abrupt change in mental status. Culture growing greater than 100k citrobacter - continue ceftriaxone - f/u sensitivities  # Dementia with behavioral  disturbance Baseline dementia, here also with encephalopathy, possible hospital delirium - cont home menantine, mirtazapine - haldol prn for severe agitation  # Debility - pt advising snf, daughter desires this, TOC aware  # CKD stage 4 Followed by nephro as outpt. Cr today 1.75 which is baseline - monitor  # RA - cont home prednisone and plaquenil  # HFpEF Euvolemic - cont home lasix     DVT prophylaxis: lovenox Code Status: full Family Communication: daughter updated @ bedside 7/8  Level of care: Med-Surg Status is: Inpatient Remains inpatient appropriate because: unsafe d/c plan    Consultants:  none  Procedures: none  Antimicrobials:  ceftriaxone    Subjective: Knee pain otherwise no complaints  Objective: Vitals:   10/05/22 2053 10/05/22 2057 10/06/22 0600 10/06/22 0818  BP: 122/61 122/61 129/72 (!) 123/52  Pulse: 93 93 96 87  Resp:  17 18 18   Temp: 98.3 F (36.8 C) 98.3 F (36.8 C) 98.3 F (36.8 C) (!) 97.5 F (36.4 C)  TempSrc: Oral Oral Oral Oral  SpO2: 97% 97% 96% 96%  Weight:      Height:        Intake/Output Summary (Last 24 hours) at 10/06/2022 1428 Last data filed at 10/05/2022 1837 Gross per 24 hour  Intake 700.99 ml  Output --  Net 700.99 ml   Filed Weights   10/05/22 0332  Weight: 72.6 kg    Examination:  General exam: Appears calm and comfortable  Respiratory system: Clear to auscultation. Respiratory effort normal. Cardiovascular system: S1 & S2 heard, RRR. Soft  systolic murmur Gastrointestinal system: Abdomen is nondistended, soft and nontender. No organomegaly or masses felt. Normal bowel sounds heard. Central nervous system: Alert and oriented to self Extremities: Symmetric 5 x 5 power. Skin: venous stasis changes lower extremities Psychiatry: calm    Data Reviewed: I have personally reviewed following labs and imaging studies  CBC: Recent Labs  Lab 10/05/22 0419 10/06/22 0556  WBC 7.3 5.7  NEUTROABS 5.1   --   HGB 9.4* 9.1*  HCT 28.8* 28.2*  MCV 92.3 93.4  PLT 171 154   Basic Metabolic Panel: Recent Labs  Lab 10/05/22 0419 10/06/22 0556  NA 139 141  K 3.6 3.9  CL 107 112*  CO2 23 22  GLUCOSE 109* 99  BUN 34* 25*  CREATININE 2.32* 1.75*  CALCIUM 8.4* 7.8*   GFR: Estimated Creatinine Clearance: 23.9 mL/min (A) (by C-G formula based on SCr of 1.75 mg/dL (H)). Liver Function Tests: Recent Labs  Lab 10/06/22 0556  AST 32  ALT 12  ALKPHOS 56  BILITOT 0.6  PROT 5.1*  ALBUMIN 2.6*   No results for input(s): "LIPASE", "AMYLASE" in the last 168 hours. No results for input(s): "AMMONIA" in the last 168 hours. Coagulation Profile: No results for input(s): "INR", "PROTIME" in the last 168 hours. Cardiac Enzymes: Recent Labs  Lab 10/05/22 0419  CKTOTAL 61   BNP (last 3 results) No results for input(s): "PROBNP" in the last 8760 hours. HbA1C: No results for input(s): "HGBA1C" in the last 72 hours. CBG: No results for input(s): "GLUCAP" in the last 168 hours. Lipid Profile: No results for input(s): "CHOL", "HDL", "LDLCALC", "TRIG", "CHOLHDL", "LDLDIRECT" in the last 72 hours. Thyroid Function Tests: No results for input(s): "TSH", "T4TOTAL", "FREET4", "T3FREE", "THYROIDAB" in the last 72 hours. Anemia Panel: No results for input(s): "VITAMINB12", "FOLATE", "FERRITIN", "TIBC", "IRON", "RETICCTPCT" in the last 72 hours. Urine analysis:    Component Value Date/Time   COLORURINE YELLOW (A) 10/05/2022 0742   APPEARANCEUR CLOUDY (A) 10/05/2022 0742   LABSPEC 1.018 10/05/2022 0742   PHURINE 6.0 10/05/2022 0742   GLUCOSEU NEGATIVE 10/05/2022 0742   HGBUR NEGATIVE 10/05/2022 0742   BILIRUBINUR NEGATIVE 10/05/2022 0742   KETONESUR NEGATIVE 10/05/2022 0742   PROTEINUR 30 (A) 10/05/2022 0742   NITRITE POSITIVE (A) 10/05/2022 0742   LEUKOCYTESUR TRACE (A) 10/05/2022 0742   Sepsis Labs: @LABRCNTIP (procalcitonin:4,lacticidven:4)  ) Recent Results (from the past 240 hour(s))   Urine Culture     Status: Abnormal (Preliminary result)   Collection Time: 10/05/22  7:42 AM   Specimen: Urine, Clean Catch  Result Value Ref Range Status   Specimen Description   Final    URINE, CLEAN CATCH Performed at [facility], [address], [place], [place] [address]    Special Requests   Final    NONE Performed at [facility], [address] [place], [place] [address]    Culture (A)  Final    >=100,000 COLONIES/mL CITROBACTER BRAAKII SUSCEPTIBILITIES TO FOLLOW Performed at [facility], [address], [place], [place] [address]    Report Status PENDING  Incomplete         Radiology Studies: US Venous Img Lower Bilateral (DVT)  Result Date: 10/05/2022 CLINICAL DATA:  [id number] Lower extremity edema [id number] EXAM: BILATERAL LOWER EXTREMITY VENOUS DOPPLER ULTRASOUND TECHNIQUE: Gray-scale sonography with graded compression, as well as color Doppler and duplex ultrasound were performed to evaluate the lower extremity deep venous systems from the level of the common femoral vein and including  the common femoral, femoral, profunda femoral, popliteal and calf veins including the posterior tibial, peroneal and gastrocnemius veins when visible. The superficial great saphenous vein was also interrogated. Spectral Doppler was utilized to evaluate flow at rest and with distal augmentation maneuvers in the common femoral, femoral and popliteal veins. COMPARISON:  LEFT lower extremity venous duplex, 05/07/2022. CT AP, 02/17/2021. FINDINGS: RIGHT LOWER EXTREMITY VENOUS Normal compressibility of the RIGHT common femoral, superficial femoral, and popliteal veins, as well as the visualized calf veins. Visualized portions of profunda femoral vein and great saphenous vein unremarkable. No filling defects to suggest DVT on grayscale or color Doppler imaging. Doppler waveforms show normal direction of venous flow, normal respiratory plasticity and response to augmentation.  OTHER No evidence of superficial thrombophlebitis or abnormal fluid collection. Limitations: none LEFT LOWER EXTREMITY VENOUS Normal compressibility of the LEFT common femoral, superficial femoral, and popliteal veins, as well as the visualized calf veins. Visualized portions of profunda femoral vein and great saphenous vein unremarkable. No filling defects to suggest DVT on grayscale or color Doppler imaging. Doppler waveforms show normal direction of venous flow, normal respiratory plasticity and response to augmentation. OTHER No evidence of superficial thrombophlebitis or abnormal fluid collection. Limitations: none IMPRESSION: No evidence of femoropopliteal DVT or superficial thrombophlebitis within either lower extremity. Roanna Banning, MD [facility] Radiology Electronically Signed   By: Roanna Banning M.D.   On: 10/05/2022 14:04   DG Chest Portable 1 View  Result Date: 10/05/2022 CLINICAL DATA:  Fall.  Dementia EXAM: PORTABLE CHEST 1 VIEW COMPARISON:  06/24/2022 FINDINGS: Normal heart size for rotation and portable technique. Generalized interstitial opacity. No visible effusion or pneumothorax. Normal heart size and mediastinal contours. There is biapical pleural based scarring with calcification. IMPRESSION: Generalized interstitial opacity similar to 06/24/2022, either chronic lung disease or interstitial edema. Electronically Signed   By: Tiburcio Pea M.D.   On: 10/05/2022 05:41   DG Humerus Right  Result Date: 10/05/2022 CLINICAL DATA:  Fall with pain. EXAM: RIGHT HUMERUS - 2 VIEW COMPARISON:  None Available. FINDINGS: There is no evidence of fracture or other focal bone lesions. Subjective osteopenia. Partial coverage of the chest reported separately. IMPRESSION: Negative for fracture or dislocation of the humerus. Electronically Signed   By: Tiburcio Pea M.D.   On: 10/05/2022 05:39   CT HEAD WO CONTRAST ( )  Result Date: 10/05/2022 CLINICAL  DATA:  Dementia with possible fall, found down outside. EXAM: CT HEAD WITHOUT CONTRAST CT CERVICAL SPINE WITHOUT CONTRAST TECHNIQUE: Multidetector CT imaging of the head and cervical spine was performed following the standard protocol without intravenous contrast. Multiplanar CT image reconstructions of the cervical spine were also generated. RADIATION DOSE REDUCTION: This exam was performed according to the departmental dose-optimization program which includes automated exposure control, adjustment of the mA and/or kV according to patient size and/or use of iterative reconstruction technique. COMPARISON:  06/22/2022 FINDINGS: CT HEAD FINDINGS Brain: No evidence of acute infarction, hemorrhage, hydrocephalus, extra-axial collection or mass lesion/mass effect. Generalized brain atrophy. Encephalomalacia in the anterior right temporal lobe and frontal operculum beneath the craniotomy flap. Vascular: No hyperdense vessel or unexpected calcification. Skull: Remote right pterional craniotomy. Sinuses/Orbits: No evidence of injury CT CERVICAL SPINE FINDINGS Alignment: Normal. Skull base and vertebrae: Chronic T2 and T3 compression fractures with healed appearance. No evidence of acute fracture or aggressive bone lesion. Soft tissues and spinal canal: No prevertebral fluid or swelling. No visible canal hematoma. Disc levels:  Ordinary degenerative endplate spurring. Upper  chest: Biapical reticulation, there is pending chest radiograph. IMPRESSION: No evidence of acute intracranial or cervical spine injury. Electronically Signed   By: Tiburcio Pea M.D.   On: 10/05/2022 05:22   CT Cervical Spine Wo Contrast  Result Date: 10/05/2022 CLINICAL DATA:  Dementia with possible fall, found down outside. EXAM: CT HEAD WITHOUT CONTRAST CT CERVICAL SPINE WITHOUT CONTRAST TECHNIQUE: Multidetector CT imaging of the head and cervical spine was performed following the standard protocol without intravenous contrast. Multiplanar CT  image reconstructions of the cervical spine were also generated. RADIATION DOSE REDUCTION: This exam was performed according to the departmental dose-optimization program which includes automated exposure control, adjustment of the mA and/or kV according to patient size and/or use of iterative reconstruction technique. COMPARISON:  06/22/2022 FINDINGS: CT HEAD FINDINGS Brain: No evidence of acute infarction, hemorrhage, hydrocephalus, extra-axial collection or mass lesion/mass effect. Generalized brain atrophy. Encephalomalacia in the anterior right temporal lobe and frontal operculum beneath the craniotomy flap. Vascular: No hyperdense vessel or unexpected calcification. Skull: Remote right pterional craniotomy. Sinuses/Orbits: No evidence of injury CT CERVICAL SPINE FINDINGS Alignment: Normal. Skull base and vertebrae: Chronic T2 and T3 compression fractures with healed appearance. No evidence of acute fracture or aggressive bone lesion. Soft tissues and spinal canal: No prevertebral fluid or swelling. No visible canal hematoma. Disc levels:  Ordinary degenerative endplate spurring. Upper chest: Biapical reticulation, there is pending chest radiograph. IMPRESSION: No evidence of acute intracranial or cervical spine injury. Electronically Signed   By: Tiburcio Pea M.D.   On: 10/05/2022 05:22        Scheduled Meds:  [START ON 10/07/2022] cyanocobalamin  1,000 mcg Oral Daily   enoxaparin (LOVENOX) injection  30 mg Subcutaneous Q24H   [START ON 10/07/2022] furosemide  40 mg Oral Daily   hydroxychloroquine  200 mg Oral BID   memantine  10 mg Oral BID   mirtazapine  7.5 mg Oral QHS   [START ON 10/07/2022] potassium chloride  10 mEq Oral Daily   [START ON 10/07/2022] predniSONE  6 mg Oral Q breakfast   Continuous Infusions:  sodium chloride 75 mL/hr at 10/05/22 1837   cefTRIAXone (ROCEPHIN)  IV 1 g (10/06/22 0859)     LOS: 1 day     Silvano Bilis, MD [facility]   If 7PM-7AM, please contact  night-coverage [email or web address] Password [email or web address] 10/06/2022, 2:28 PM

## 2022-10-06 NOTE — [place] FL2 (Signed)
[place] MEDICAID FL2 LEVEL OF CARE FORM     IDENTIFICATION  Patient Name: Carol Kent Birthdate: [birth year] Sex: female Admission Date (Current Location): 10/05/2022  [facility] and [place] Number:  Chiropodist and Address:  [facility], [address], [place], [place] [address]      Provider Number: [id number]  Attending Physician Name and Address:  Kathrynn Running, MD  Relative Name and Phone Number:       Current Level of Care: Hospital Recommended Level of Care: Skilled Nursing Facility Prior Approval Number:    Date Approved/Denied:   PASRR Number: [id number]  Discharge Plan: SNF    Current Diagnoses: Patient Active Problem List   Diagnosis Date Noted   Hypertension 10/06/2022   UTI (urinary tract infection) 10/05/2022   Falls 10/05/2022   (HFpEF) heart failure with preserved ejection fraction (HCC) 10/05/2022   Venous stasis dermatitis 10/05/2022   Vascular dementia, unspecified severity, with anxiety (HCC) 08/27/2022   Cellulitis of lower extremity 05/27/2022   CKD (chronic kidney disease), stage IV (HCC) 05/27/2022   Elevated lactic acid level 05/27/2022   Acute metabolic encephalopathy 05/27/2022   Chronic diastolic CHF (congestive heart failure) (HCC) 05/27/2022   AKI (acute kidney injury) (HCC) 11/04/2020   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    Pancytopenia (HCC)    Acute kidney injury superimposed on CKD (HCC) 11/13/2019   Hypokalemia 11/13/2019   Diarrhea 11/13/2019   LLQ pain 11/13/2019   Rheumatoid arthritis (HCC) 11/13/2019    Orientation RESPIRATION BLADDER Height & Weight     Self, Situation, Place  Normal Continent Weight: 160 lb (72.6 kg) Height:  5\' 5"  (165.1 cm)  BEHAVIORAL SYMPTOMS/MOOD NEUROLOGICAL BOWEL NUTRITION STATUS   (None)  (Dementia) Continent Diet (Heart healthy/carb modified)  AMBULATORY STATUS COMMUNICATION OF NEEDS Skin   Limited Assist Verbally Skin abrasions, Bruising, Other  (Comment) (Skin tear on right elbow: Gauze, paper tape prn. Wund on left leg: ABD, xeroform, kerlix, ace wrap every other day.)                       Personal Care Assistance Level of Assistance  Bathing, Feeding, Dressing Bathing Assistance: Limited assistance Feeding assistance: Limited assistance Dressing Assistance: Limited assistance     Functional Limitations Info  Sight, Hearing, Speech Sight Info: Adequate Hearing Info: Adequate Speech Info: Adequate    SPECIAL CARE FACTORS FREQUENCY  PT (By licensed PT), OT (By licensed OT)     PT Frequency: 5 x week OT Frequency: 5 x week            Contractures Contractures Info: Not present    Additional Factors Info  Code Status, Allergies Code Status Info: Full code Allergies Info: Codeine, Erythromycin, Fentanyl, Morphine And Codeine, Orencia (Abatacept), Propoxyphene, Sulfa Antibiotics, Other, Penicillins           Current Medications (10/06/2022):  This is the current hospital active medication list Current Facility-Administered Medications  Medication Dose Route Frequency Provider Last Rate Last Admin   acetaminophen (TYLENOL) tablet 650 mg  650 mg Oral Q6H PRN Andris Baumann, MD   650 mg at 10/06/22 5621   Or   acetaminophen (TYLENOL) suppository 650 mg  650 mg Rectal Q6H PRN Andris Baumann, MD       cefTRIAXone (ROCEPHIN) 1 g in sodium chloride 0.9 % 100 mL IVPB  1 g Intravenous Q24H Floydene Flock, MD 200 mL/hr at 10/06/22 0859 1 g at 10/06/22  4132   [START ON 10/07/2022] cyanocobalamin (VITAMIN B12) tablet 1,000 mcg  1,000 mcg Oral Daily Wouk, Wilfred Curtis, MD       enoxaparin (LOVENOX) injection 30 mg  30 mg Subcutaneous Q24H Floydene Flock, MD   30 mg at 10/05/22 2101   [START ON 10/07/2022] furosemide (LASIX) tablet 40 mg  40 mg Oral Daily Wouk, Wilfred Curtis, MD       haloperidol lactate (HALDOL) injection 2 mg  2 mg Intravenous Q6H PRN Wouk, Wilfred Curtis, MD       haloperidol lactate (HALDOL) injection 5  mg  5 mg Intravenous Once PRN Wouk, Wilfred Curtis, MD       HYDROcodone-acetaminophen (NORCO/VICODIN) 5-325 MG per tablet 1-2 tablet  1-2 tablet Oral Q4H PRN Andris Baumann, MD   2 tablet at 10/05/22 2151   hydroxychloroquine (PLAQUENIL) tablet 200 mg  200 mg Oral BID Floydene Flock, MD   200 mg at 10/06/22 4401   memantine (NAMENDA) tablet 10 mg  10 mg Oral BID Floydene Flock, MD   10 mg at 10/06/22 0900   mirtazapine (REMERON) tablet 7.5 mg  7.5 mg Oral QHS Wouk, Wilfred Curtis, MD       ondansetron [facility]) tablet 4 mg  4 mg Oral Q6H PRN Floydene Flock, MD       Or   ondansetron [facility]) injection 4 mg  4 mg Intravenous Q6H PRN Floydene Flock, MD       [START ON 10/07/2022] potassium chloride (KLOR-CON M) CR tablet 10 mEq  10 mEq Oral Daily Wouk, Wilfred Curtis, MD       Melene Muller ON 10/07/2022] predniSONE (DELTASONE) tablet 6 mg  6 mg Oral Q breakfast Wouk, Wilfred Curtis, MD         Discharge Medications: Please see discharge summary for a list of discharge medications.  Relevant Imaging Results:  Relevant Lab Results:   Additional Information SS#: [id number]. Switched to inpatient on 7/7.  Margarito Liner, LCSW

## 2022-10-06 NOTE — TOC Initial Note (Addendum)
Transition of Care [facility]) - Initial/Assessment Note    Patient Details  Name: Carol Kent MRN: [id number] Date of Birth: Jun 03, 1940  Transition of Care [name]) CM/SW Contact:    Margarito Liner, LCSW Phone Number: 10/06/2022, 12:09 PM  Clinical Narrative: Readmission prevention screen complete. CSW unable to enter data at this time. Patient appears confused. Daughter at bedside. CSW introduced role and explained that discharge planning would be discussed. PCP is [name], MD. Daughter drives her to appointments. [facility] [address]. No issues obtaining medications. Patient lives home alone. She does have a life alert and family is working on getting internet at her home so that they can set up cameras outside of the home. Also discussed potential baby monitors inside the home. Patient is active with [facility] for RN and ST. She has a rollator, elevated toilet seat, and shower chair at home. Discussed SNF recommendation. Daughter would like to speak to MD before deciding if they would like to pursue this or not. CSW did explained that she would have to meet 3-night inpatient stay for insurance to cover SNF. CSW sent secure chat to MD asking him to update after discussing with them. First preference is [facility]. Second preference is [facility]. No further concerns. CSW encouraged patient's daughter to contact CSW as needed. CSW will continue to follow patient and her daughter for support and facilitate discharge once medically stable.          2:45 pm: Sent out SNF referral. Left messages for [facility] and [employer] coordinators asking them to review.         Expected Discharge Plan:  (TBD) Barriers to Discharge: Continued Medical Work up   Patient Goals and CMS Choice            Expected Discharge Plan and Services     Post Acute Care Choice:  (TBD) Living arrangements for the past 2 months: Single Family Home                                       Prior Living Arrangements/Services Living arrangements for the past 2 months: Single Family Home Lives with:: Self Patient language and need for interpreter reviewed:: Yes Do you feel safe going back to the place where you live?: Yes      Need for Family Participation in Patient Care: Yes (Comment)   Current home services: DME, Home RN, Other (comment) (Home ST) Criminal Activity/Legal Involvement Pertinent to Current Situation/Hospitalization: No - Comment as needed  Activities of Daily Living Home Assistive Devices/Equipment: Walker (specify type) ADL Screening (condition at time of admission) Patient's cognitive ability adequate to safely complete daily activities?: Yes Is the patient deaf or have difficulty hearing?: No Does the patient have difficulty seeing, even when wearing glasses/contacts?: No Does the patient have difficulty concentrating, remembering, or making decisions?: Yes Patient able to express need for assistance with ADLs?: Yes Does the patient have difficulty dressing or bathing?: Yes Independently performs ADLs?: No Communication: Needs assistance Is this a change from baseline?: Change from baseline, expected to last <3 days Dressing (OT): Needs assistance Is this a change from baseline?: Change from baseline, expected to last <3days Grooming: Needs assistance Is this a change from baseline?: Change from baseline, expected to last <3 days Feeding: Needs assistance Is this a change from baseline?: Change from baseline, expected to last <  3 days Bathing: Needs assistance Is this a change from baseline?: Change from baseline, expected to last <3 days Toileting: Needs assistance Is this a change from baseline?: Change from baseline, expected to last <3 days In/Out Bed: Needs assistance Is this a change from baseline?: Change from baseline, expected to last <3 days Walks in Home: Needs assistance Is this a change from baseline?: Change from  baseline, expected to last <3 days Does the patient have difficulty walking or climbing stairs?: Yes Weakness of Legs: Both Weakness of Arms/Hands: Both  Permission Sought/Granted Permission sought to share information with : Family Supports Permission granted to share information with : Yes, Verbal Permission Granted  Share Information with NAME: Burna Forts     Permission granted to share info w Relationship: Daughter  Permission granted to share info w Contact Information: [phone]  Emotional Assessment Appearance:: Appears stated age Attitude/Demeanor/Rapport: Unable to Assess Affect (typically observed): Unable to Assess Orientation: : Oriented to Self, Oriented to Place, Oriented to Situation Alcohol / Substance Use: Not Applicable Psych Involvement: No (comment)  Admission diagnosis:  UTI (urinary tract infection) [N39.0] Prerenal azotemia [R79.89] Acute cystitis without hematuria [N30.00] Fall, initial encounter [W19.XXXA] Patient Active Problem List   Diagnosis Date Noted   UTI (urinary tract infection) 10/05/2022   Falls 10/05/2022   (HFpEF) heart failure with preserved ejection fraction (HCC) 10/05/2022   Venous stasis dermatitis 10/05/2022   Cellulitis of lower extremity 05/27/2022   CKD (chronic kidney disease), stage IV (HCC) 05/27/2022   Elevated lactic acid level 05/27/2022   Acute metabolic encephalopathy 05/27/2022   Chronic diastolic CHF (congestive heart failure) (HCC) 05/27/2022   AKI (acute kidney injury) (HCC) 11/04/2020   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    Pancytopenia (HCC)    Acute kidney injury superimposed on CKD (HCC) 11/13/2019   Hypokalemia 11/13/2019   Diarrhea 11/13/2019   LLQ pain 11/13/2019   Rheumatoid arthritis (HCC) 11/13/2019   PCP:  [name], MD [facility] [address] [place] - [address] [place] [address] Phone: [phone] Fax: [phone]     Social Determinants of  Health (SDOH) Social History: SDOH Screenings   Food Insecurity: No Food Insecurity (10/05/2022)  Housing: Low Risk  (10/05/2022)  Transportation Needs: No Transportation Needs (10/05/2022)  Utilities: Not At Risk (10/05/2022)  Tobacco Use: Low Risk  (10/05/2022)   SDOH Interventions:     Readmission Risk Interventions    11/05/2020    1:56 PM  Readmission Risk Prevention Plan  Transportation Screening Complete  PCP or Specialist Appt within 3-5 Days Complete  HRI or Home Care Consult Complete  Social Work Consult for Recovery Care Planning/Counseling Complete  Palliative Care Screening Not Applicable  Medication Review Oceanographer) Complete

## 2022-10-07 ENCOUNTER — Ambulatory Visit: Payer: Medicare Other | Admitting: Physician Assistant

## 2022-10-07 LAB — URINE CULTURE

## 2022-10-07 LAB — BASIC METABOLIC PANEL
Anion gap: 8 (ref 5–15)
BUN: 22 mg/dL (ref 8–23)
CO2: 19 mmol/L — ABNORMAL LOW (ref 22–32)
Calcium: 7.9 mg/dL — ABNORMAL LOW (ref 8.9–10.3)
Chloride: 112 mmol/L — ABNORMAL HIGH (ref 98–111)
Creatinine, Ser: 1.72 mg/dL — ABNORMAL HIGH (ref 0.44–1.00)
GFR, Estimated: 29 mL/min — ABNORMAL LOW (ref >60)
Glucose, Bld: 93 mg/dL (ref 70–99)
Potassium: 3.7 mmol/L (ref 3.5–5.1)
Sodium: 139 mmol/L (ref 135–145)

## 2022-10-07 MED ORDER — CEPHALEXIN 500 MG PO CAPS: 500.0000 mg | ORAL_CAPSULE | Freq: Two times a day (BID) | ORAL | Status: DC

## 2022-10-07 MED ORDER — CEFUROXIME AXETIL 500 MG PO TABS
250.0000 mg | ORAL_TABLET | Freq: Two times a day (BID) | ORAL | Status: DC
  Administered 2022-10-07 – 2022-10-08 (×2): 250 mg via ORAL
  Filled 2022-10-07 (×2): qty 0.5

## 2022-10-07 NOTE — TOC Progression Note (Addendum)
Transition of Care [facility]) - Progression Note    Patient Details  Name: Carol Kent MRN: [id number] Date of Birth: January 23, 1941  [facility]) CM/SW Contact  Margarito Liner, LCSW Phone Number: 10/07/2022, 11:10 AM  Clinical Narrative:  Carol Kent is unable to offer a bed. [facility] has not responded yet. Per Diplomatic Services operational officer, admissions coordinator is off today and there is no one covering her.   1:17 pm: Updated patient and daughter at bedside. Provided current bed offers. If [facility] is unable to offer a bed, daughter would like to accept offer from [facility].  Expected Discharge Plan:  (TBD) Barriers to Discharge: Continued Medical Work up  Expected Discharge Plan and Services     Post Acute Care Choice:  (TBD) Living arrangements for the past 2 months: Single Family Home                                       Social Determinants of Health (SDOH) Interventions SDOH Screenings   Food Insecurity: No Food Insecurity (10/05/2022)  Housing: Low Risk  (10/05/2022)  Transportation Needs: No Transportation Needs (10/05/2022)  Utilities: Not At Risk (10/05/2022)  Tobacco Use: Low Risk  (10/05/2022)    Readmission Risk Interventions    11/05/2020    1:56 PM  Readmission Risk Prevention Plan  Transportation Screening Complete  PCP or Specialist Appt within 3-5 Days Complete  HRI or Home Care Consult Complete  Social Work Consult for Recovery Care Planning/Counseling Complete  Palliative Care Screening Not Applicable  Medication Review Oceanographer) Complete

## 2022-10-07 NOTE — Progress Notes (Addendum)
Physical Therapy Treatment Patient Details Name: Carol Kent MRN: [id number] DOB: 1940/08/15 Today's Date: 10/07/2022   History of Present Illness Pt admitted to [facility] on 10/05/22 for c/o potential fall and AMS. UA concerning for UTI. Imaging negative for acute fracture. Significant PMH includes: dementia, HFpEF, CKD (IV), RA, venous stasis dermatitis, B12 deficiency, AKI.    PT Comments  Pt is in chair, daughter and MD present. Pt reports 8/10 pain on R calf when grazed against chair, no pain otherwise. Pt performed sit<>stand c/ CGA, ambulating ~41ft c/ minA for HHA, cueing for trying to ambulate c/o grabbing for external support consistently. Pt able to stand c/o HHA to pull up pants after using toilet, needs minA to pull down pants. Pt performed 5xSTS in 26 seconds using BUE on chair to push, pt fatigued O2 sats dropped to 89, recovered to 91-92 c/ pursed lip breathing. Pt performed exercises in chair c/o O2 sat drop. Pt left in chair, family in room, needs within reach, alarm set. Pt would benefit from skilled PT interventions to improve mobility, maximize independence, and functional activity tolerance.     Assistance Recommended at Discharge Frequent or constant Supervision/Assistance  If plan is discharge home, recommend the following:  Can travel by private vehicle    A little help with walking and/or transfers;A little help with bathing/dressing/bathroom;Assistance with cooking/housework;Direct supervision/assist for medications management;Assist for transportation;Help with stairs or ramp for entrance   Yes  Equipment Recommendations  Other (comment) (TBD next venue of care)    Recommendations for Other Services       Precautions / Restrictions Precautions Precautions: Fall Restrictions Weight Bearing Restrictions: No     Mobility  Bed Mobility                    Transfers Overall transfer level: Needs assistance Equipment used: None Transfers: Sit to/from  Stand Sit to Stand: Min guard           General transfer comment: CGA sit<>stand from chair BUE support, delayed trunk extension, cueing for hand placement. 5xSTS in 26 seconds, fatigued after 5xSTS    Ambulation/Gait Ambulation/Gait assistance: Min assist Gait Distance (Feet): 30 Feet Assistive device: 1 person hand held assist Gait Pattern/deviations: Step-through pattern, Decreased stride length Gait velocity: decreased     General Gait Details: minA HHA walking chair<>bathroom, flexed posture, tries to grab onto anything for support   Stairs             Wheelchair Mobility     Tilt Bed    Modified Rankin (Stroke Patients Only)       Balance Overall balance assessment: Needs assistance Sitting-balance support: Feet supported Sitting balance-Leahy Scale: Good     Standing balance support: Reliant on assistive device for balance, Single extremity supported Standing balance-Leahy Scale: Fair Standing balance comment: able to pull up pants in standing, requires assist to pull down, no LOB.                            Cognition Arousal/Alertness: Awake/alert Behavior During Therapy: WFL for tasks assessed/performed Overall Cognitive Status: Within Functional Limits for tasks assessed                                 General Comments: daughter in room during session, reports mother is near baseline cognitively, needs a few min after waking up to gather self  Exercises Total Joint Exercises Ankle Circles/Pumps: AROM, Both, 20 reps, Seated Long Arc Quad: AROM, Both, 10 reps, Seated Marching in Standing: AROM, Both, 10 reps, Seated    General Comments        Pertinent Vitals/Pain Pain Assessment Pain Assessment: 0-10 Pain Score: 8  Pain Location: R calf, bruising noted Pain Descriptors / Indicators: Sharp Pain Intervention(s): Limited activity within patient's tolerance, Monitored during session, Repositioned     Home Living                          Prior Function            PT Goals (current goals can now be found in the care plan section) Progress towards PT goals: Progressing toward goals    Frequency    Min 1X/week      PT Plan Current plan remains appropriate    Co-evaluation              AM-PAC PT "6 Clicks" Mobility   Outcome Measure  Help needed turning from your back to your side while in a flat bed without using bedrails?: None Help needed moving from lying on your back to sitting on the side of a flat bed without using bedrails?: None Help needed moving to and from a bed to a chair (including a wheelchair)?: A Little Help needed standing up from a chair using your arms (e.g., wheelchair or bedside chair)?: A Little Help needed to walk in hospital room?: A Little Help needed climbing 3-5 steps with a railing? : A Little 6 Click Score: 20    End of Session Equipment Utilized During Treatment: Gait belt Activity Tolerance: Patient tolerated treatment well Patient left: in chair;with call bell/phone within reach;with chair alarm set;with family/visitor present Nurse Communication: Mobility status PT Visit Diagnosis: Unsteadiness on feet (R26.81);Repeated falls (R29.6);History of falling (Z91.81);Difficulty in walking, not elsewhere classified (R26.2)     Time: 4098-1191 PT Time Calculation (min) (ACUTE ONLY): 25 min  Charges:    $Therapeutic Activity: 23-37 mins PT General Charges $$ ACUTE PT VISIT: 1 Visit                    Lala Lund, PT, SPT  4:07 PM,10/07/22

## 2022-10-07 NOTE — Progress Notes (Addendum)
PROGRESS NOTE    BLUE RUGGERIO  ZOX:[id number] DOB: 1940-12-31 DOA: 10/05/2022 PCP: [name], MD  Outpatient Specialists: neurology, nephrology    Brief Narrative:   From admission h and p   Carol Kent is a 82 y.o. female with medical history significant of dementia, HFpEF, stage IV CKD, rheumatoid arthritis presenting with fall, encephalopathy, UTI, dehydration, acute on chronic kidney injury.  History primarily per patient's daughter in the setting of encephalopathy and baseline dementia.  Per report, patient found down outside at home.  Patient is medical alert bracelet was alarmed.  There was no response by the EMS team.  Per the daughter, patient lives at home alone though family is very nearby.  Reports having some generalized mild confusion and fatigue over the past 2 days above baseline.  No reported fevers or chills.  No reported nausea or vomiting.  Baseline dementia.  Has family check on her on a fairly regular basis.  Both daughters work with EMS per report.  Family unaware of the exact time that patient was down.    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 Ct of c spine, head, right humerus, chest no fracture. Ck wnl. X-ray right knee no fracture  # Dysarthria With confusion. Possibly 2/2 uti as is improving. MRI no signs cva (some motion degradation)  # Acute cystitis Ua consistent with bacteriuria. Daughter reports relatively abrupt change in mental status. Culture growing greater than 100k citrobacter, pan-sensitive.  - switch to cefuroxime to  complete a 5-day course (through 7/12). Per pharmacy if cephalosporin chosen needs to be third gen or higher  # Dementia with behavioral disturbance Baseline dementia, here also with encephalopathy, possible hospital delirium. Improving - cont home menantine, mirtazapine - haldol prn for severe agitation  # Debility - pt advising snf, daughter desires this, can discharge there tomorrow  # CKD stage 4 stable - monitor  # RA - cont home prednisone and plaquenil  # Venous stasis - ok to use home compression stockings  # HFpEF Euvolemic - cont home lasix     DVT prophylaxis: lovenox Code Status: full Family Communication: daughter updated @ bedside 7/9  Level of care: Med-Surg Status is: Inpatient Remains inpatient appropriate because: unsafe d/c plan, soonest can d/c to snf is tomorrow    Consultants:  none  Procedures: none  Antimicrobials:  Ceftriaxone>keflex   Subjective: Knee pain otherwise no complaints  Objective: Vitals:   10/06/22 1536 10/06/22 1946 10/07/22 0358 10/07/22 0803  BP: 131/74 133/75 (!) 125/55 (!) 110/51  Pulse: 94 (!) 110 (!) 107 (!) 101  Resp: 18 18 20 18   Temp: 98.1 F (36.7 C) 98.5 F (36.9 C) 98.4 F (36.9 C) 98.8 F (37.1 C)  TempSrc:    Oral  SpO2: 99% 95% 93% 94%  Weight:      Height:        Intake/Output Summary (Last 24 hours) at 10/07/2022 1416 Last data filed at 10/06/2022 1837 Gross per 24 hour  Intake 340 ml  Output --  Net 340 ml   [employer]  10/05/22 0332  Weight: 72.6 kg    Examination:  General exam: Appears calm and comfortable  Respiratory system: Clear to auscultation. Respiratory effort normal. Cardiovascular system: S1 & S2 heard, RRR. Soft systolic murmur Gastrointestinal system: Abdomen is nondistended, soft and nontender. No organomegaly or masses felt. Normal bowel sounds heard. Central nervous system: Alert and oriented to self Extremities: Symmetric 5 x 5 power. Skin: venous stasis  changes lower extremities Psychiatry: calm    Data Reviewed: I have personally reviewed following labs and imaging studies  CBC: Recent Labs  Lab 10/05/22 0419 10/06/22 0556  WBC 7.3 5.7  NEUTROABS 5.1  --   HGB 9.4* 9.1*  HCT 28.8* 28.2*  MCV 92.3 93.4  PLT 171 154   Basic Metabolic Panel: Recent Labs  Lab 10/05/22 0419 10/06/22 0556 10/07/22 0353  NA 139 141 139  K 3.6 3.9 3.7  CL 107 112* 112*  CO2 23 22 19*  GLUCOSE 109* 99 93  BUN 34* 25* 22  CREATININE 2.32* 1.75* 1.72*  CALCIUM 8.4* 7.8* 7.9*   GFR: Estimated Creatinine Clearance: 24.3 mL/min (A) (by C-G formula based on SCr of 1.72 mg/dL (H)). Liver Function Tests: Recent Labs  Lab 10/06/22 0556  AST 32  ALT 12  ALKPHOS 56  BILITOT 0.6  PROT 5.1*  ALBUMIN 2.6*   No results for input(s): "LIPASE", "AMYLASE" in the last 168 hours. No results for input(s): "AMMONIA" in the last 168 hours. Coagulation Profile: No results for input(s): "INR", "PROTIME" in the last 168 hours. Cardiac Enzymes: Recent Labs  Lab 10/05/22 0419  CKTOTAL 61   BNP (last 3 results) No results for input(s): "PROBNP" in the last 8760 hours. HbA1C: No results for input(s): "HGBA1C" in the last 72 hours. CBG: No results for input(s): "GLUCAP" in the last 168 hours. Lipid Profile: No results for input(s): "CHOL", "HDL", "LDLCALC", "TRIG", "CHOLHDL", "LDLDIRECT" in the last 72 hours. Thyroid Function Tests: No results for input(s): "TSH", "T4TOTAL", "FREET4", "T3FREE", "THYROIDAB" in the last 72 hours. Anemia Panel: No results for input(s): "VITAMINB12", "FOLATE", "FERRITIN", "TIBC", "IRON", "RETICCTPCT" in the last 72 hours. Urine analysis:    Component Value Date/Time   COLORURINE YELLOW (A) 10/05/2022 0742   APPEARANCEUR CLOUDY (A) 10/05/2022 0742   LABSPEC 1.018 10/05/2022 0742   PHURINE 6.0 10/05/2022 0742   GLUCOSEU NEGATIVE 10/05/2022 0742   HGBUR NEGATIVE 10/05/2022 0742   BILIRUBINUR NEGATIVE 10/05/2022 0742    KETONESUR NEGATIVE 10/05/2022 0742   PROTEINUR 30 (A) 10/05/2022 0742   NITRITE POSITIVE (A) 10/05/2022 0742   LEUKOCYTESUR TRACE (A) 10/05/2022 0742   Sepsis Labs: @LABRCNTIP (procalcitonin:4,lacticidven:4)  ) Recent Results (from the past 240 hour(s))  Urine Culture     Status: Abnormal   Collection Time: 10/05/22  7:42 AM   Specimen: Urine, Clean Catch  Result Value Ref Range Status   Specimen Description   Final    URINE, CLEAN CATCH Performed at [facility], [address] [place], [place] [address]    Special Requests   Final    NONE Performed at [facility], [address] [place], [place] [address]    Culture >=100,000 COLONIES/mL CITROBACTER BRAAKII (A)  Final   Report Status 10/07/2022 FINAL  Final   Organism ID, Bacteria CITROBACTER BRAAKII (A)  Final      Susceptibility   Citrobacter braakii - MIC*    CEFEPIME <=0.12 SENSITIVE Sensitive     CEFTRIAXONE <=0.25 SENSITIVE Sensitive     CIPROFLOXACIN <=0.25 SENSITIVE Sensitive  GENTAMICIN <=1 SENSITIVE Sensitive     IMIPENEM <=0.25 SENSITIVE Sensitive     NITROFURANTOIN <=16 SENSITIVE Sensitive     TRIMETH/SULFA <=20 SENSITIVE Sensitive     PIP/TAZO 8 SENSITIVE Sensitive     * >=100,000 COLONIES/mL CITROBACTER BRAAKII         Radiology Studies: MR BRAIN WO CONTRAST  Result Date: 10/07/2022 CLINICAL DATA:  Encephalopathy, dysarthria. EXAM: MRI HEAD WITHOUT CONTRAST TECHNIQUE: Multiplanar, multiecho pulse sequences of the brain and surrounding structures were obtained without intravenous contrast. COMPARISON:  No prior MRI available, correlation is made with 10/05/2022 CT head FINDINGS: Evaluation is somewhat limited by motion artifact. Brain: No restricted diffusion to suggest acute or subacute infarct. No acute hemorrhage, mass, mass effect, or midline shift. No hydrocephalus or extra-axial collection. Normal craniocervical junction. Encephalomalacia in the right anterior temporal lobe  and right frontal operculum. Confluent T2 hyperintense signal in the periventricular white matter, likely the sequela of moderate chronic small vessel ischemic disease. Age related cerebral atrophy. 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 Per a CTA head and neck report in Care Everywhere, this was present in 2017. Otherwise normal arterial flow voids. Skull and upper cervical spine: Prior right pterional craniotomy. Otherwise normal marrow signal. Sinuses/Orbits: No acute finding. Other: Trace fluid in the mastoid air cells. IMPRESSION: Evaluation is somewhat limited by motion. Within this limitation, no acute intracranial process. No evidence of acute or subacute infarct. Electronically Signed   By: Wiliam Ke M.D.   On: 10/07/2022 03:29   DG Knee 1-2 Views Right  Result Date: 10/06/2022 CLINICAL DATA:  Fall.  Right knee pain. EXAM: RIGHT KNEE - 1-2 VIEW COMPARISON:  Right tibia and fibula radiographs 06/22/2022 FINDINGS: Mild medial compartment joint space narrowing. Mild chronic enthesopathic change at the quadriceps insertion on the patella, similar to prior. No joint effusion. There is patient's clothing causes overlying artifact, including over the posterior proximal aspect of the tibia on lateral view. Density in this region is favored to be secondary to the patient's clothing. No definitive acute fracture is seen, although evaluation of the proximal posterior tibia is limited on this lateral view. Mild-to-moderate atherosclerotic calcifications. IMPRESSION: 1. Mild medial compartment joint space narrowing. 2. No definitive acute fracture is seen, although evaluation of the proximal posterior tibia is limited on lateral view due to overlying density that is favored to represent the patient's clothing. Electronically Signed   By: Neita Garnet M.D.   On: 10/06/2022 17:18        Scheduled Meds:  cyanocobalamin  1,000 mcg Oral Daily   enoxaparin (LOVENOX) injection  30 mg Subcutaneous Q24H    furosemide  40 mg Oral Daily   hydroxychloroquine  200 mg Oral BID   memantine  10 mg Oral BID   mirtazapine  7.5 mg Oral QHS   potassium chloride  10 mEq Oral Daily   predniSONE  6 mg Oral Q breakfast   Continuous Infusions:     LOS: 2 days     Silvano Bilis, MD [facility]   If 7PM-7AM, please contact night-coverage [email or web address] Password [email or web address] 10/07/2022, 2:16 PM

## 2022-10-07 NOTE — Progress Notes (Signed)
OT Cancellation Note  Patient Details Name: Carol Kent MRN: [id number] DOB: 04-Feb-1941   Cancelled Treatment:    Reason Eval/Treat Not Completed: Other (comment). Upon attempt, pt with nursing for care. Will re-attempt at later date/time as pt is available.   Arman Filter., MPH, MS, OTR/L ascom [phone] 10/07/22, 3:36 PM

## 2022-10-08 DIAGNOSIS — N3000 Acute cystitis without hematuria: Secondary | ICD-10-CM

## 2022-10-08 DIAGNOSIS — W19XXXA Unspecified fall, initial encounter: Secondary | ICD-10-CM | POA: Diagnosis not present

## 2022-10-08 DIAGNOSIS — G9341 Metabolic encephalopathy: Secondary | ICD-10-CM

## 2022-10-08 DIAGNOSIS — N179 Acute kidney failure, unspecified: Secondary | ICD-10-CM | POA: Diagnosis not present

## 2022-10-08 NOTE — Care Management Important Message (Signed)
Important Message  Patient Details  Name: Carol Kent MRN: [id number] Date of Birth: Jan 21, 1941   Medicare Important Message Given:  Yes     Carol Kent 10/08/2022, 11:02 AM

## 2022-10-08 NOTE — Plan of Care (Signed)
Adequate for discharge. Resolve plan of care  Carol Kent V Marybel Alcott   

## 2022-10-08 NOTE — TOC Transition Note (Signed)
Transition of Care [facility]) - CM/SW Discharge Note   Patient Details  Name: Carol Kent MRN: [id number] Date of Birth: 1941-02-11  Transition of Care [facility]) CM/SW Contact:  Margarito Liner, LCSW Phone Number: 10/08/2022, 12:08 PM   Clinical Narrative:  Patient has orders to discharge to [facility] today. RN is calling report now. Daughter used to work for [employer] so she arranged transport through them and said they should be here after 2:00. No further concerns. CSW signing off.   Final next level of care: Skilled Nursing Facility Barriers to Discharge: Barriers Resolved   Patient Goals and CMS Choice   Choice offered to / list presented to : Adult Children  Discharge Placement PASRR number recieved: 10/06/22 PASRR number recieved: 10/06/22            Patient chooses bed at: [facility] [place] Patient to be transferred to facility by: [facility] Name of family member notified: Kandra Nicolas Patient and family notified of of transfer: 10/08/22  Discharge Plan and Services Additional resources added to the After Visit Summary for       Post Acute Care Choice:  (TBD)                               Social Determinants of Health (SDOH) Interventions SDOH Screenings   Food Insecurity: No Food Insecurity (10/05/2022)  Housing: Low Risk  (10/05/2022)  Transportation Needs: No Transportation Needs (10/05/2022)  Utilities: Not At Risk (10/05/2022)  Tobacco Use: Low Risk  (10/05/2022)     Readmission Risk Interventions    11/05/2020    1:56 PM  Readmission Risk Prevention Plan  Transportation Screening Complete  PCP or Specialist Appt within 3-5 Days Complete  HRI or Home Care Consult Complete  Social Work Consult for Recovery Care Planning/Counseling Complete  Palliative Care Screening Not Applicable  Medication Review Oceanographer) Complete

## 2022-10-08 NOTE — Discharge Summary (Signed)
Physician Discharge Summary   Patient: Carol Kent MRN: [id number] DOB: 1940-10-29  Admit date:     10/05/2022  Discharge date: 10/08/22  Discharge Physician: Enedina Finner   PCP: [name], MD   Recommendations at discharge:    F/u PCP in 1-2 weeks  Discharge Diagnoses: Principal Problem:   UTI (urinary tract infection) Active Problems:   Falls   Acute metabolic encephalopathy   Acute kidney injury superimposed on CKD (HCC)   Rheumatoid arthritis (HCC)   CKD (chronic kidney disease), stage IV (HCC)   (HFpEF) heart failure with preserved ejection fraction (HCC)   Venous stasis dermatitis   Vascular dementia, unspecified severity, with anxiety (HCC)  Carol Kent is a 82 y.o. female with medical history significant of dementia, HFpEF, stage IV CKD, rheumatoid arthritis presenting with fall, encephalopathy, UTI, dehydration, acute on chronic kidney injury.  History primarily per patient's daughter in the setting of encephalopathy and baseline dementia.  Per report, patient found down outside at home.  Patient is medical alert bracelet was alarmed.  There was no response by the EMS team.  Per the daughter, patient lives at home alone though family is very nearby.  Reports having some generalized mild confusion and fatigue over the past 2 days above baseline.    Fall Debility - pt advising snf, daughter desires this,. Will d/c to rehab Complains of chronic knee pain today. Ct of c spine, head, right humerus, chest no fracture. Ck wnl. X-ray right knee no fracture  Acute on CKD stage 4 --came in with creat 2.32--1.7 after IVF --cont po fluids  Dysarthria With confusion. Possibly 2/2 uti and dehydration as is improving. MRI no signs cva (some motion degradation) Speech ok   Acute cystitis Ua consistent with bacteriuria. Daughter reports relatively abrupt change in mental status. Culture growing greater than 100k citrobacter, pan-sensitive.  - d/w ID pharmacist--3 days IV  rocephin  is sufficient. UA does not show wbc    Dementia with behavioral disturbance Baseline dementia, here also with encephalopathy, possible hospital delirium. Improving - cont home menantine, mirtazapine    RA - cont home prednisone and plaquenil   Chronic Venous stasis - ok to use home compression stockings    Chronic HFpEF Euvolemic - cont home lasix   Overall improving    DVT prophylaxis: lovenox Code Status: full Family Communication: daughter updated @ bedside 7/10    Disposition: Rehabilitation facility Diet recommendation:  Discharge Diet Orders (From admission, onward)     Start     Ordered   10/08/22 0000  Diet - low sodium heart healthy        10/08/22 1048           Cardiac diet DISCHARGE MEDICATION: Allergies as of 10/08/2022       Reactions   Codeine Nausea Only   Other reaction(s): Nausea   Erythromycin Nausea Only   Fentanyl    Other reaction(s): Hallucination   Morphine And Codeine    Other reaction(s): Other (See Comments)   Orencia [abatacept]    Propoxyphene Nausea Only   Other reaction(s): Nausea   Sulfa Antibiotics    Other reaction(s): Nausea   Other Rash   Pain med - can not remember name   Penicillins Rash   Other reaction(s): Hives        Medication List     TAKE these medications    cyanocobalamin 1000 MCG tablet Take 1 tablet (1,000 mcg total) by mouth daily.   EPINEPHrine 0.3 mg/0.3  mL Soaj injection Commonly known as: EPI-PEN Inject 0.3 mg into the skin as directed.   furosemide 40 MG tablet Commonly known as: Lasix Take 1 tablet by mouth 3 times a week. What changed:  how much to take how to take this when to take this additional instructions   hydroxychloroquine 200 MG tablet Commonly known as: PLAQUENIL Take 400 mg by mouth daily.   memantine 10 MG tablet Commonly known as: NAMENDA Take 10 mg by mouth 2 (two) times daily.   mirtazapine 7.5 MG tablet Commonly known as: REMERON Take 7.5 mg  by mouth at bedtime.   potassium chloride 10 MEQ tablet Commonly known as: KLOR-CON Take 10 mEq by mouth daily.   predniSONE 1 MG tablet Commonly known as: DELTASONE Take 6 mg by mouth daily with breakfast.   vitamin C 100 MG tablet Take 100 mg by mouth daily.   Xeroform Petrolat Patch 4"x4" Pads Apply 1 patch topically daily.               Discharge Care Instructions  (From admission, onward)           Start     Ordered   10/08/22 0000  Discharge wound care:       Comments: 10/05/22 1208    Wound care  Every other day    Comments: Wound care to bilateral LE open areas:  Wash legs with soap and water, rinse and dry. Cover open areas with folded layers of Xeroform gauze. Top with ABD pads. Secure by wrapping LEs from just below toes to just below knees. Top Kerlix with 6-inch ACE bandages applied in a similar manner.   10/08/22 1048            Contact information for follow-up providers     [name], MD. Schedule an appointment as soon as possible for a visit in 1 week(s).   Specialty: Family Medicine Why: hospital f/u Contact information: [address] [facility] [place] [address] [phone]              Contact information for after-discharge care     Destination     [facility] [address] [place] [facility] Preferred SNF .   Service: Skilled Nursing Contact information: [address] [place] [address] [phone]                    Discharge Exam: Ceasar Mons Weights   10/05/22 0332  Weight: 72.6 kg   General exam: Appears calm and comfortable  Respiratory system: Clear to auscultation. Respiratory effort normal. Cardiovascular system: S1 & S2 heard, RRR. Soft systolic murmur Gastrointestinal system: Abdomen is nondistended, soft and nontender Normal bowel sounds heard. Central nervous system: Alert and oriented to self. Skin: venous stasis changes lower extremities  Condition at discharge:  fair  The results of significant diagnostics from this hospitalization (including imaging, microbiology, ancillary and laboratory) are listed below for reference.   Imaging Studies: MR BRAIN WO CONTRAST  Result Date: 10/07/2022 CLINICAL DATA:  Encephalopathy, dysarthria. EXAM: MRI HEAD WITHOUT CONTRAST TECHNIQUE: Multiplanar, multiecho pulse sequences of the brain and surrounding structures were obtained without intravenous contrast. COMPARISON:  No prior MRI available, correlation is made with 10/05/2022 CT head FINDINGS: Evaluation is somewhat limited by motion artifact. Brain: No restricted diffusion to suggest acute or subacute infarct. No acute hemorrhage, mass, mass effect, or midline shift. No hydrocephalus or extra-axial collection. Normal craniocervical junction. Encephalomalacia in the right anterior temporal lobe and right  frontal operculum. Confluent T2 hyperintense signal in the periventricular white matter, likely the sequela of moderate chronic small vessel ischemic disease. Age related cerebral atrophy. 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 Per a CTA head and neck report in Care Everywhere, this was present in 2017. Otherwise normal arterial flow voids. Skull and upper cervical spine: Prior right pterional craniotomy. Otherwise normal marrow signal. Sinuses/Orbits: No acute finding. Other: Trace fluid in the mastoid air cells. IMPRESSION: Evaluation is somewhat limited by motion. Within this limitation, no acute intracranial process. No evidence of acute or subacute infarct. Electronically Signed   By: Wiliam Ke M.D.   On: 10/07/2022 03:29   DG Knee 1-2 Views Right  Result Date: 10/06/2022 CLINICAL DATA:  Fall.  Right knee pain. EXAM: RIGHT KNEE - 1-2 VIEW COMPARISON:  Right tibia and fibula radiographs 06/22/2022 FINDINGS: Mild medial compartment joint space narrowing. Mild chronic enthesopathic change at the quadriceps insertion on the patella, similar to prior. No joint  effusion. There is patient's clothing causes overlying artifact, including over the posterior proximal aspect of the tibia on lateral view. Density in this region is favored to be secondary to the patient's clothing. No definitive acute fracture is seen, although evaluation of the proximal posterior tibia is limited on this lateral view. Mild-to-moderate atherosclerotic calcifications. IMPRESSION: 1. Mild medial compartment joint space narrowing. 2. No definitive acute fracture is seen, although evaluation of the proximal posterior tibia is limited on lateral view due to overlying density that is favored to represent the patient's clothing. Electronically Signed   By: Neita Garnet M.D.   On: 10/06/2022 17:18   US Venous Img Lower Bilateral (DVT)  Result Date: 10/05/2022 CLINICAL DATA:  [id number] Lower extremity edema [id number] EXAM: BILATERAL LOWER EXTREMITY VENOUS DOPPLER ULTRASOUND TECHNIQUE: Gray-scale sonography with graded compression, as well as color Doppler and duplex ultrasound were performed to evaluate the lower extremity deep venous systems from the level of the common femoral vein and including the common femoral, femoral, profunda femoral, popliteal and calf veins including the posterior tibial, peroneal and gastrocnemius veins when visible. The superficial great saphenous vein was also interrogated. Spectral Doppler was utilized to evaluate flow at rest and with distal augmentation maneuvers in the common femoral, femoral and popliteal veins. COMPARISON:  LEFT lower extremity venous duplex, 05/07/2022. CT AP, 02/17/2021. FINDINGS: RIGHT LOWER EXTREMITY VENOUS Normal compressibility of the RIGHT common femoral, superficial femoral, and popliteal veins, as well as the visualized calf veins. Visualized portions of profunda femoral vein and great saphenous vein unremarkable. No filling defects to suggest DVT on grayscale or color Doppler imaging. Doppler waveforms show normal direction of venous flow,  normal respiratory plasticity and response to augmentation. OTHER No evidence of superficial thrombophlebitis or abnormal fluid collection. Limitations: none LEFT LOWER EXTREMITY VENOUS Normal compressibility of the LEFT common femoral, superficial femoral, and popliteal veins, as well as the visualized calf veins. Visualized portions of profunda femoral vein and great saphenous vein unremarkable. No filling defects to suggest DVT on grayscale or color Doppler imaging. Doppler waveforms show normal direction of venous flow, normal respiratory plasticity and response to augmentation. OTHER No evidence of superficial thrombophlebitis or abnormal fluid collection. Limitations: none IMPRESSION: No evidence of femoropopliteal DVT or superficial thrombophlebitis within either lower extremity. Roanna Banning, MD [facility] Radiology Electronically Signed   By: Roanna Banning M.D.   On: 10/05/2022 14:04   DG Chest Portable 1 View  Result Date: 10/05/2022 CLINICAL DATA:  Fall.  Dementia EXAM: PORTABLE CHEST 1 VIEW COMPARISON:  06/24/2022 FINDINGS: Normal heart size for rotation and portable technique. Generalized interstitial opacity. No visible effusion or pneumothorax. Normal heart size and mediastinal contours. There is biapical pleural based scarring with calcification. IMPRESSION: Generalized interstitial opacity similar to 06/24/2022, either chronic lung disease or interstitial edema. Electronically Signed   By: Tiburcio Pea M.D.   On: 10/05/2022 05:41   DG Humerus Right  Result Date: 10/05/2022 CLINICAL DATA:  Fall with pain. EXAM: RIGHT HUMERUS - 2 VIEW COMPARISON:  None Available. FINDINGS: There is no evidence of fracture or other focal bone lesions. Subjective osteopenia. Partial coverage of the chest reported separately. IMPRESSION: Negative for fracture or dislocation of the humerus. Electronically Signed   By: Tiburcio Pea M.D.   On: 10/05/2022 05:39    CT HEAD WO CONTRAST ( )  Result Date: 10/05/2022 CLINICAL DATA:  Dementia with possible fall, found down outside. EXAM: CT HEAD WITHOUT CONTRAST CT CERVICAL SPINE WITHOUT CONTRAST TECHNIQUE: Multidetector CT imaging of the head and cervical spine was performed following the standard protocol without intravenous contrast. Multiplanar CT image reconstructions of the cervical spine were also generated. RADIATION DOSE REDUCTION: This exam was performed according to the departmental dose-optimization program which includes automated exposure control, adjustment of the mA and/or kV according to patient size and/or use of iterative reconstruction technique. COMPARISON:  06/22/2022 FINDINGS: CT HEAD FINDINGS Brain: No evidence of acute infarction, hemorrhage, hydrocephalus, extra-axial collection or mass lesion/mass effect. Generalized brain atrophy. Encephalomalacia in the anterior right temporal lobe and frontal operculum beneath the craniotomy flap. Vascular: No hyperdense vessel or unexpected calcification. Skull: Remote right pterional craniotomy. Sinuses/Orbits: No evidence of injury CT CERVICAL SPINE FINDINGS Alignment: Normal. Skull base and vertebrae: Chronic T2 and T3 compression fractures with healed appearance. No evidence of acute fracture or aggressive bone lesion. Soft tissues and spinal canal: No prevertebral fluid or swelling. No visible canal hematoma. Disc levels:  Ordinary degenerative endplate spurring. Upper chest: Biapical reticulation, there is pending chest radiograph. IMPRESSION: No evidence of acute intracranial or cervical spine injury. Electronically Signed   By: Tiburcio Pea M.D.   On: 10/05/2022 05:22   CT Cervical Spine Wo Contrast  Result Date: 10/05/2022 CLINICAL DATA:  Dementia with possible fall, found down outside. EXAM: CT HEAD WITHOUT CONTRAST CT CERVICAL SPINE WITHOUT CONTRAST TECHNIQUE: Multidetector CT imaging of the head and cervical spine was performed following the  standard protocol without intravenous contrast. Multiplanar CT image reconstructions of the cervical spine were also generated. RADIATION DOSE REDUCTION: This exam was performed according to the departmental dose-optimization program which includes automated exposure control, adjustment of the mA and/or kV according to patient size and/or use of iterative reconstruction technique. COMPARISON:  06/22/2022 FINDINGS: CT HEAD FINDINGS Brain: No evidence of acute infarction, hemorrhage, hydrocephalus, extra-axial collection or mass lesion/mass effect. Generalized brain atrophy. Encephalomalacia in the anterior right temporal lobe and frontal operculum beneath the craniotomy flap. Vascular: No hyperdense vessel or unexpected calcification. Skull: Remote right pterional craniotomy. Sinuses/Orbits: No evidence of injury CT CERVICAL SPINE FINDINGS Alignment: Normal. Skull base and vertebrae: Chronic T2 and T3 compression fractures with healed appearance. No evidence of acute fracture or aggressive bone lesion. Soft tissues and spinal canal: No prevertebral fluid or swelling. No visible canal hematoma. Disc levels:  Ordinary degenerative endplate spurring. Upper chest: Biapical reticulation, there is pending chest radiograph. IMPRESSION: No evidence of acute intracranial or cervical spine injury. Electronically Signed   By: Audry Riles.D.  On: 10/05/2022 05:22   US RENAL  Result Date: 09/09/2022 CLINICAL DATA:  Chronic renal disease EXAM: RENAL / URINARY TRACT ULTRASOUND COMPLETE COMPARISON:  None Available. FINDINGS: Right Kidney: Renal measurements: 8.9 x 4.3 x 4.7 cm = volume: 95 mL. Increased echogenicity Left Kidney: Renal measurements: 8.6 x 4.8 x 4.1 cm = volume: 89 mL. Increased cortical echogenicity. Bladder: Appears normal for degree of bladder distention. Other: None. IMPRESSION: Increased cortical echogenicity in both kidneys consistent with medical renal disease. No other abnormalities. Electronically  Signed   By: Gerome Sam III M.D.   On: 09/09/2022 15:01    Microbiology: Results for orders placed or performed during the hospital encounter of 10/05/22  Urine Culture     Status: Abnormal   Collection Time: 10/05/22  7:42 AM   Specimen: Urine, Clean Catch  Result Value Ref Range Status   Specimen Description   Final    URINE, CLEAN CATCH Performed at [facility], [address] [place], [place] [address]    Special Requests   Final    NONE Performed at [facility], [address] [place], [place] [address]    Culture >=100,000 COLONIES/mL CITROBACTER BRAAKII (A)  Final   Report Status 10/07/2022 FINAL  Final   Organism ID, Bacteria CITROBACTER BRAAKII (A)  Final      Susceptibility   Citrobacter braakii - MIC*    CEFEPIME <=0.12 SENSITIVE Sensitive     CEFTRIAXONE <=0.25 SENSITIVE Sensitive     CIPROFLOXACIN <=0.25 SENSITIVE Sensitive     GENTAMICIN <=1 SENSITIVE Sensitive     IMIPENEM <=0.25 SENSITIVE Sensitive     NITROFURANTOIN <=16 SENSITIVE Sensitive     TRIMETH/SULFA <=20 SENSITIVE Sensitive     PIP/TAZO 8 SENSITIVE Sensitive     * >=100,000 COLONIES/mL CITROBACTER BRAAKII    Labs: CBC: Recent Labs  Lab 10/05/22 0419 10/06/22 0556  WBC 7.3 5.7  NEUTROABS 5.1  --   HGB 9.4* 9.1*  HCT 28.8* 28.2*  MCV 92.3 93.4  PLT 171 154   Basic Metabolic Panel: Recent Labs  Lab 10/05/22 0419 10/06/22 0556 10/07/22 0353  NA 139 141 139  K 3.6 3.9 3.7  CL 107 112* 112*  CO2 23 22 19*  GLUCOSE 109* 99 93  BUN 34* 25* 22  CREATININE 2.32* 1.75* 1.72*  CALCIUM 8.4* 7.8* 7.9*   Liver Function Tests: Recent Labs  Lab 10/06/22 0556  AST 32  ALT 12  ALKPHOS 56  BILITOT 0.6  PROT 5.1*  ALBUMIN 2.6*    Discharge time spent: greater than 30 minutes.  Signed: Enedina Finner, MD [facility] 10/08/2022

## 2022-10-08 NOTE — TOC Progression Note (Addendum)
Transition of Care [facility]) - Progression Note    Patient Details  Name: Carol Kent MRN: [id number] Date of Birth: 10/23/1940  [facility] CM/SW Contact  Margarito Liner, LCSW Phone Number: 10/08/2022, 9:52 AM  Clinical Narrative:   Left message for [facility] admissions coordinator to see if they can offer patient a bed.  10:22 am: [facility] does not have a bed available. [facility] does have a bed today. Daughter accepted offer. Left message for admissions coordinator to see if they have a private room.  Expected Discharge Plan:  (TBD) Barriers to Discharge: Continued Medical Work up  Expected Discharge Plan and Services     Post Acute Care Choice:  (TBD) Living arrangements for the past 2 months: Single Family Home                                       Social Determinants of Health (SDOH) Interventions SDOH Screenings   Food Insecurity: No Food Insecurity (10/05/2022)  Housing: Low Risk  (10/05/2022)  Transportation Needs: No Transportation Needs (10/05/2022)  Utilities: Not At Risk (10/05/2022)  Tobacco Use: Low Risk  (10/05/2022)    Readmission Risk Interventions    11/05/2020    1:56 PM  Readmission Risk Prevention Plan  Transportation Screening Complete  PCP or Specialist Appt within 3-5 Days Complete  HRI or Home Care Consult Complete  Social Work Consult for Recovery Care Planning/Counseling Complete  Palliative Care Screening Not Applicable  Medication Review Oceanographer) Complete

## 2022-10-08 NOTE — Progress Notes (Addendum)
Occupational Therapy Treatment Patient Details Name: Carol Kent MRN: [id number] DOB: 11-21-1940 Today's Date: 10/08/2022   History of present illness Pt admitted to [facility] on 10/05/22 for c/o potential fall and AMS. UA concerning for UTI. Imaging negative for acute fracture. Significant PMH includes: dementia, HFpEF, CKD (IV), RA, venous stasis dermatitis, B12 deficiency, AKI.   OT comments  Pt received seated EOB with daughter in room. Appearing alert; willing to work with OT on transfer to chair. T/f MIN A-CGA with handheld assist to sidestep to chair. See flowsheet below for further details of session. Left seated in chair, positioned with pillows under bottom and behind back for comfort, daughter in room, with all needs in reach. No chair alarm in place; RN/NA made aware. Patient will benefit from continued OT while in acute care.    Recommendations for follow up therapy are one component of a multi-disciplinary discharge planning process, led by the attending physician.  Recommendations may be updated based on patient status, additional functional criteria and insurance authorization.    Assistance Recommended at Discharge Frequent or constant Supervision/Assistance  Patient can return home with the following  A little help with walking and/or transfers;A lot of help with bathing/dressing/bathroom;Assistance with cooking/housework;Assist for transportation;Help with stairs or ramp for entrance;Assistance with feeding;Direct supervision/assist for financial management;Direct supervision/assist for medications management   Equipment Recommendations  Tub/shower bench    Recommendations for Other Services      Precautions / Restrictions Precautions Precautions: Fall Restrictions Weight Bearing Restrictions: No       Mobility Bed Mobility               General bed mobility comments: Pt received seated at EOB with daughter in room; bed alarm going off; daughter stating pt is  preparing to t/f to chair.    Transfers Overall transfer level: Needs assistance Equipment used: None Transfers: Sit to/from Stand Sit to Stand: Min assist Stand pivot transfers: Min assist (handheld assist)         General transfer comment: handheld assist in L hand for t/f to recliner. cues for hand placement for stand to sit.     Balance Overall balance assessment: Needs assistance Sitting-balance support: Feet supported Sitting balance-Leahy Scale: Good     Standing balance support: Single extremity supported Standing balance-Leahy Scale: Fair                             ADL either performed or assessed with clinical judgement   ADL Overall ADL's : Needs assistance/impaired Eating/Feeding: Set up;Sitting Eating/Feeding Details (indicate cue type and reason): pt set up for breakfast; needing assist to open containers                                 Functional mobility during ADLs: Minimal assistance (handheld assist)      Extremity/Trunk Assessment Upper Extremity Assessment Upper Extremity Assessment: Generalized weakness   Lower Extremity Assessment Lower Extremity Assessment: Generalized weakness        Vision       Perception     Praxis      Cognition Arousal/Alertness: Awake/alert Behavior During Therapy: WFL for tasks assessed/performed Overall Cognitive Status: History of cognitive impairments - at baseline  General Comments: daughter in room during session; supportive.        Exercises      Shoulder Instructions       General Comments Pt c/o hips hurting during sitting and mobility. Once in chair, pt continues to endorse hip pain. Pt stood with MIN A HHA of OT once more for OT to place pillows on seat of chair. Pt stating it felt a little bit better. Pillows placed behind back as well.    Pertinent Vitals/ Pain       Pain Assessment Pain Assessment: 0-10 Pain  Score:  (unrated; moderate) Pain Location: bilateral hips Pain Descriptors / Indicators: Aching Pain Intervention(s): Limited activity within patient's tolerance, Monitored during session  Home Living                                          Prior Functioning/Environment              Frequency  Min 1X/week        Progress Toward Goals  OT Goals(current goals can now be found in the care plan section)  Progress towards OT goals: Progressing toward goals  Acute Rehab OT Goals Patient Stated Goal: Pain to stop OT Goal Formulation: With patient/family Time For Goal Achievement: 10/20/22 Potential to Achieve Goals: Good ADL Goals Pt Will Perform Grooming: standing;with min assist Pt Will Transfer to Toilet: bedside commode;ambulating;with min assist Pt Will Perform Toileting - Clothing Manipulation and hygiene: sit to/from stand;with min assist;with adaptive equipment  Plan Discharge plan remains appropriate    Co-evaluation                 AM-PAC OT "6 Clicks" Daily Activity     Outcome Measure   Help from another person eating meals?: A Little Help from another person taking care of personal grooming?: A Little Help from another person toileting, which includes using toliet, bedpan, or urinal?: A Little Help from another person bathing (including washing, rinsing, drying)?: A Lot Help from another person to put on and taking off regular upper body clothing?: A Little Help from another person to put on and taking off regular lower body clothing?: A Lot 6 Click Score: 16    End of Session    OT Visit Diagnosis: Other abnormalities of gait and mobility (R26.89);Muscle weakness (generalized) (M62.81)   Activity Tolerance Patient tolerated treatment well;Patient limited by pain   Patient Left in chair;with call bell/phone within reach;with chair alarm set;with family/visitor present   Nurse Communication Mobility status        Time:  8295-6213 OT Time Calculation (min): 8 min  Charges: OT General Charges $OT Visit: 1 Visit OT Treatments $Therapeutic Activity: 8-22 mins  Linward Foster, MS, OTR/L  Alvester Morin 10/08/2022, 10:09 AM

## 2022-10-08 NOTE — Progress Notes (Signed)
Mobility Specialist - Progress Note     10/08/22 1357  Mobility  Activity Ambulated with assistance in hallway  Level of Assistance Standby assist, set-up cues, supervision of patient - no hands on  Assistive Device Front wheel walker  Distance Ambulated (ft) 160 ft  Range of Motion/Exercises Active  Activity Response Tolerated well  Mobility Referral Yes  $Mobility charge 1 Mobility  Mobility Specialist Start Time (ACUTE ONLY) 1333  Mobility Specialist Stop Time (ACUTE ONLY) 1351  Mobility Specialist Time Calculation (min) (ACUTE ONLY) 18 min   Pt resting in recliner on RA upon entry. Pt STS and ambulates around NS for 1 lap with AD SBA. Pt had decreased awareness to self in relation to obects and needed cuing to avoid hitting walls and doors. Pt returned to recliner and left with needs in reach. Pt daughter present at bedside.   Johnathan Hausen Mobility Specialist 10/08/22, 2:08 PM

## 2022-10-31 ENCOUNTER — Emergency Department: Payer: Medicare Other

## 2022-10-31 ENCOUNTER — Other Ambulatory Visit: Payer: Self-pay

## 2022-10-31 ENCOUNTER — Inpatient Hospital Stay
Admission: EM | Admit: 2022-10-31 | Discharge: 2022-11-03 | DRG: 308 | Disposition: A | Discharge status: Hospice | Payer: Medicare Other | Source: Skilled Nursing Facility | Attending: Internal Medicine | Admitting: Internal Medicine

## 2022-10-31 DIAGNOSIS — Z1152 Encounter for screening for COVID-19: Secondary | ICD-10-CM | POA: Diagnosis not present

## 2022-10-31 DIAGNOSIS — Z7952 Long term (current) use of systemic steroids: Secondary | ICD-10-CM | POA: Diagnosis not present

## 2022-10-31 DIAGNOSIS — S0083XA Contusion of other part of head, initial encounter: Secondary | ICD-10-CM

## 2022-10-31 DIAGNOSIS — D649 Anemia, unspecified: Secondary | ICD-10-CM

## 2022-10-31 DIAGNOSIS — I959 Hypotension, unspecified: Secondary | ICD-10-CM | POA: Diagnosis not present

## 2022-10-31 DIAGNOSIS — Y92122 Bedroom in nursing home as the place of occurrence of the external cause: Secondary | ICD-10-CM

## 2022-10-31 DIAGNOSIS — Y92009 Unspecified place in unspecified non-institutional (private) residence as the place of occurrence of the external cause: Secondary | ICD-10-CM

## 2022-10-31 DIAGNOSIS — T07XXXA Unspecified multiple injuries, initial encounter: Secondary | ICD-10-CM

## 2022-10-31 DIAGNOSIS — Z8673 Personal history of transient ischemic attack (TIA), and cerebral infarction without residual deficits: Secondary | ICD-10-CM | POA: Diagnosis not present

## 2022-10-31 DIAGNOSIS — E785 Hyperlipidemia, unspecified: Secondary | ICD-10-CM | POA: Diagnosis present

## 2022-10-31 DIAGNOSIS — Z66 Do not resuscitate: Secondary | ICD-10-CM | POA: Diagnosis present

## 2022-10-31 DIAGNOSIS — Z515 Encounter for palliative care: Secondary | ICD-10-CM

## 2022-10-31 DIAGNOSIS — I4891 Unspecified atrial fibrillation: Secondary | ICD-10-CM | POA: Diagnosis present

## 2022-10-31 DIAGNOSIS — Z7982 Long term (current) use of aspirin: Secondary | ICD-10-CM | POA: Diagnosis not present

## 2022-10-31 DIAGNOSIS — J9601 Acute respiratory failure with hypoxia: Secondary | ICD-10-CM

## 2022-10-31 DIAGNOSIS — Z881 Allergy status to other antibiotic agents status: Secondary | ICD-10-CM

## 2022-10-31 DIAGNOSIS — F0154 Vascular dementia, unspecified severity, with anxiety: Secondary | ICD-10-CM | POA: Diagnosis present

## 2022-10-31 DIAGNOSIS — Z885 Allergy status to narcotic agent status: Secondary | ICD-10-CM | POA: Diagnosis not present

## 2022-10-31 DIAGNOSIS — S41111A Laceration without foreign body of right upper arm, initial encounter: Secondary | ICD-10-CM | POA: Diagnosis not present

## 2022-10-31 DIAGNOSIS — E872 Acidosis, unspecified: Secondary | ICD-10-CM | POA: Diagnosis present

## 2022-10-31 DIAGNOSIS — Z880 Allergy status to penicillin: Secondary | ICD-10-CM | POA: Diagnosis not present

## 2022-10-31 DIAGNOSIS — Z888 Allergy status to other drugs, medicaments and biological substances status: Secondary | ICD-10-CM | POA: Diagnosis not present

## 2022-10-31 DIAGNOSIS — I130 Hypertensive heart and chronic kidney disease with heart failure and stage 1 through stage 4 chronic kidney disease, or unspecified chronic kidney disease: Secondary | ICD-10-CM | POA: Diagnosis present

## 2022-10-31 DIAGNOSIS — Z87891 Personal history of nicotine dependence: Secondary | ICD-10-CM | POA: Diagnosis not present

## 2022-10-31 DIAGNOSIS — W19XXXA Unspecified fall, initial encounter: Secondary | ICD-10-CM

## 2022-10-31 DIAGNOSIS — F01511 Vascular dementia, unspecified severity, with agitation: Secondary | ICD-10-CM | POA: Diagnosis present

## 2022-10-31 DIAGNOSIS — R5381 Other malaise: Secondary | ICD-10-CM | POA: Diagnosis present

## 2022-10-31 DIAGNOSIS — I5032 Chronic diastolic (congestive) heart failure: Secondary | ICD-10-CM | POA: Diagnosis present

## 2022-10-31 DIAGNOSIS — Z882 Allergy status to sulfonamides status: Secondary | ICD-10-CM

## 2022-10-31 DIAGNOSIS — M069 Rheumatoid arthritis, unspecified: Secondary | ICD-10-CM | POA: Diagnosis present

## 2022-10-31 DIAGNOSIS — I952 Hypotension due to drugs: Secondary | ICD-10-CM | POA: Diagnosis present

## 2022-10-31 DIAGNOSIS — N1831 Chronic kidney disease, stage 3a: Secondary | ICD-10-CM | POA: Diagnosis not present

## 2022-10-31 DIAGNOSIS — W06XXXA Fall from bed, initial encounter: Secondary | ICD-10-CM | POA: Diagnosis present

## 2022-10-31 DIAGNOSIS — T461X5A Adverse effect of calcium-channel blockers, initial encounter: Secondary | ICD-10-CM | POA: Diagnosis present

## 2022-10-31 DIAGNOSIS — Z7189 Other specified counseling: Secondary | ICD-10-CM | POA: Diagnosis not present

## 2022-10-31 DIAGNOSIS — Z79899 Other long term (current) drug therapy: Secondary | ICD-10-CM

## 2022-10-31 DIAGNOSIS — N184 Chronic kidney disease, stage 4 (severe): Secondary | ICD-10-CM | POA: Diagnosis present

## 2022-10-31 DIAGNOSIS — I10 Essential (primary) hypertension: Secondary | ICD-10-CM | POA: Diagnosis present

## 2022-10-31 LAB — CBC WITH DIFFERENTIAL/PLATELET
Abs Immature Granulocytes: 0.16 10*3/uL — ABNORMAL HIGH (ref 0.00–0.07)
Basophils Absolute: 0 10*3/uL (ref 0.0–0.1)
Basophils Relative: 0 %
Eosinophils Absolute: 0.1 10*3/uL (ref 0.0–0.5)
Eosinophils Relative: 1 %
HCT: 23.9 % — ABNORMAL LOW (ref 36.0–46.0)
Hemoglobin: 7.9 g/dL — ABNORMAL LOW (ref 12.0–15.0)
Immature Granulocytes: 2 %
Lymphocytes Relative: 10 %
Lymphs Abs: 0.9 10*3/uL (ref 0.7–4.0)
MCH: 29.7 pg (ref 26.0–34.0)
MCHC: 33.1 g/dL (ref 30.0–36.0)
MCV: 89.8 fL (ref 80.0–100.0)
Monocytes Absolute: 0.7 10*3/uL (ref 0.1–1.0)
Monocytes Relative: 7 %
Neutro Abs: 7.6 10*3/uL (ref 1.7–7.7)
Neutrophils Relative %: 80 %
Platelets: 351 10*3/uL (ref 150–400)
RBC: 2.66 MIL/uL — ABNORMAL LOW (ref 3.87–5.11)
RDW: 14.8 % (ref 11.5–15.5)
WBC: 9.4 10*3/uL (ref 4.0–10.5)
nRBC: 0 % (ref 0.0–0.2)

## 2022-10-31 LAB — URINALYSIS, ROUTINE W REFLEX MICROSCOPIC
Bilirubin Urine: NEGATIVE
Glucose, UA: NEGATIVE mg/dL
Hgb urine dipstick: NEGATIVE
Ketones, ur: NEGATIVE mg/dL
Nitrite: NEGATIVE
Protein, ur: NEGATIVE mg/dL
Specific Gravity, Urine: 1.012 (ref 1.005–1.030)
Squamous Epithelial / HPF: NONE SEEN /HPF (ref 0–5)
pH: 5 (ref 5.0–8.0)

## 2022-10-31 LAB — BASIC METABOLIC PANEL
Anion gap: 11 (ref 5–15)
BUN: 26 mg/dL — ABNORMAL HIGH (ref 8–23)
CO2: 20 mmol/L — ABNORMAL LOW (ref 22–32)
Calcium: 8.1 mg/dL — ABNORMAL LOW (ref 8.9–10.3)
Chloride: 107 mmol/L (ref 98–111)
Creatinine, Ser: 1.53 mg/dL — ABNORMAL HIGH (ref 0.44–1.00)
GFR, Estimated: 33 mL/min — ABNORMAL LOW (ref >60)
Glucose, Bld: 112 mg/dL — ABNORMAL HIGH (ref 70–99)
Potassium: 3.6 mmol/L (ref 3.5–5.1)
Sodium: 138 mmol/L (ref 135–145)

## 2022-10-31 LAB — TROPONIN I (HIGH SENSITIVITY)
Troponin I (High Sensitivity): 38 ng/L — ABNORMAL HIGH (ref <18)
Troponin I (High Sensitivity): 41 ng/L — ABNORMAL HIGH (ref <18)

## 2022-10-31 LAB — SARS CORONAVIRUS 2 BY RT PCR: SARS Coronavirus 2 by RT PCR: NEGATIVE

## 2022-10-31 LAB — GLUCOSE, CAPILLARY: Glucose-Capillary: 95 mg/dL (ref 70–99)

## 2022-10-31 MED ORDER — BARRIER CREAM NON-SPECIFIED
1.0000 | TOPICAL_CREAM | Freq: Three times a day (TID) | TOPICAL | Status: DC
  Administered 2022-11-01: 1 via TOPICAL

## 2022-10-31 MED ORDER — HYDROXYCHLOROQUINE SULFATE 200 MG PO TABS
200.0000 mg | ORAL_TABLET | Freq: Two times a day (BID) | ORAL | Status: DC
  Administered 2022-11-01 – 2022-11-03 (×5): 200 mg via ORAL
  Filled 2022-10-31 (×8): qty 1

## 2022-10-31 MED ORDER — ACETAMINOPHEN 650 MG RE SUPP: 650.0000 mg | Freq: Four times a day (QID) | RECTAL | Status: DC | PRN

## 2022-10-31 MED ORDER — ACETAMINOPHEN 325 MG PO TABS: 650.0000 mg | ORAL_TABLET | ORAL | Status: DC | PRN

## 2022-10-31 MED ORDER — DILTIAZEM HCL 25 MG/5ML IV SOLN
10.0000 mg | Freq: Once | INTRAVENOUS | Status: AC
  Administered 2022-10-31: 10 mg via INTRAVENOUS
  Filled 2022-10-31: qty 5

## 2022-10-31 MED ORDER — ONDANSETRON HCL 4 MG PO TABS: 4.0000 mg | ORAL_TABLET | Freq: Four times a day (QID) | ORAL | Status: DC | PRN

## 2022-10-31 MED ORDER — VITAMIN B-12 1000 MCG PO TABS
1000.0000 ug | ORAL_TABLET | Freq: Every day | ORAL | Status: DC
  Administered 2022-11-01 – 2022-11-03 (×3): 1000 ug via ORAL
  Filled 2022-10-31 (×3): qty 1

## 2022-10-31 MED ORDER — SODIUM CHLORIDE 0.9 % IV BOLUS
500.0000 mL | Freq: Once | INTRAVENOUS | Status: AC
  Administered 2022-10-31: 500 mL via INTRAVENOUS

## 2022-10-31 MED ORDER — PREDNISONE 5 MG PO TABS
6.0000 mg | ORAL_TABLET | Freq: Every day | ORAL | Status: DC
  Administered 2022-11-01 – 2022-11-03 (×3): 6 mg via ORAL
  Filled 2022-10-31 (×3): qty 1

## 2022-10-31 MED ORDER — ACETAMINOPHEN 325 MG PO TABS
650.0000 mg | ORAL_TABLET | Freq: Four times a day (QID) | ORAL | Status: DC | PRN
  Administered 2022-11-01 – 2022-11-02 (×3): 650 mg via ORAL
  Filled 2022-10-31 (×3): qty 2

## 2022-10-31 MED ORDER — ONDANSETRON HCL 4 MG/2ML IJ SOLN: 4.0000 mg | Freq: Four times a day (QID) | INTRAMUSCULAR | Status: DC | PRN

## 2022-10-31 MED ORDER — ASPIRIN 81 MG PO CHEW
81.0000 mg | CHEWABLE_TABLET | Freq: Every day | ORAL | Status: DC
  Administered 2022-11-01 – 2022-11-03 (×3): 81 mg via ORAL
  Filled 2022-10-31 (×3): qty 1

## 2022-10-31 MED ORDER — MIRTAZAPINE 15 MG PO TABS
7.5000 mg | ORAL_TABLET | Freq: Every day | ORAL | Status: DC
  Administered 2022-11-01 – 2022-11-02 (×3): 7.5 mg via ORAL
  Filled 2022-10-31 (×3): qty 1

## 2022-10-31 MED ORDER — FERROUS SULFATE 325 (65 FE) MG PO TABS
325.0000 mg | ORAL_TABLET | Freq: Every morning | ORAL | Status: DC
  Administered 2022-11-01 – 2022-11-03 (×3): 325 mg via ORAL
  Filled 2022-10-31 (×3): qty 1

## 2022-10-31 MED ORDER — AMIODARONE HCL IN DEXTROSE 360-4.14 MG/200ML-% IV SOLN
60.0000 mg/h | INTRAVENOUS | Status: AC
  Administered 2022-10-31 – 2022-11-01 (×2): 60 mg/h via INTRAVENOUS
  Filled 2022-10-31 (×2): qty 200

## 2022-10-31 MED ORDER — AMIODARONE LOAD VIA INFUSION
150.0000 mg | Freq: Once | INTRAVENOUS | Status: AC
  Administered 2022-10-31: 150 mg via INTRAVENOUS
  Filled 2022-10-31: qty 83.34

## 2022-10-31 MED ORDER — ATORVASTATIN CALCIUM 20 MG PO TABS
40.0000 mg | ORAL_TABLET | Freq: Every morning | ORAL | Status: DC
  Administered 2022-11-01 – 2022-11-02 (×2): 40 mg via ORAL
  Filled 2022-10-31 (×2): qty 2

## 2022-10-31 MED ORDER — MEMANTINE HCL 5 MG PO TABS
10.0000 mg | ORAL_TABLET | Freq: Two times a day (BID) | ORAL | Status: DC
  Administered 2022-11-01 – 2022-11-03 (×6): 10 mg via ORAL
  Filled 2022-10-31 (×3): qty 1
  Filled 2022-10-31: qty 2
  Filled 2022-10-31 (×2): qty 1
  Filled 2022-10-31: qty 2

## 2022-10-31 MED ORDER — SODIUM CHLORIDE 0.9 % IV SOLN: Freq: Once | INTRAVENOUS | Status: AC

## 2022-10-31 MED ORDER — AMIODARONE HCL IN DEXTROSE 360-4.14 MG/200ML-% IV SOLN
30.0000 mg/h | INTRAVENOUS | Status: DC
  Administered 2022-11-01 (×2): 30 mg/h via INTRAVENOUS
  Filled 2022-10-31 (×2): qty 200

## 2022-10-31 MED ORDER — "XEROFORM PETROLAT PATCH 4""X4"" EX PADS": 1.0000 | MEDICATED_PAD | Freq: Every day | CUTANEOUS | Status: DC

## 2022-10-31 NOTE — Assessment & Plan Note (Signed)
Renal function at baseline around 1.53

## 2022-10-31 NOTE — Assessment & Plan Note (Signed)
Continue Plaquenil and prednisone

## 2022-10-31 NOTE — Assessment & Plan Note (Signed)
Continue mirtazapine and Namenda

## 2022-10-31 NOTE — ED Provider Notes (Signed)
[facility] Provider Note    Event Date/Time   First MD Initiated Contact with Patient 10/31/22 1800     (approximate)   History   Fall   HPI  Carol Kent is a 82 y.o. female who presents to the emergency department from [facility] today after an unwitnessed fall.  Patient fell out of her bed.  She thinks she might of hit the bedside table on the way to the ground.  She is complaining primarily of right shoulder pain.  Patient recently had admission at outside hospital after a stroke.  There is some concern from living facility but apparently she is not aware of her new deficits so was trying to get out of bed when she should not be.     Physical Exam   Triage Vital Signs: ED Triage Vitals  Encounter Vitals Group     BP 10/31/22 1800 103/68     Systolic BP Percentile --      Diastolic BP Percentile --      Pulse Rate 10/31/22 1800 (!) 147     Resp 10/31/22 1800 18     Temp 10/31/22 1800 98 F (36.7 C)     Temp Source 10/31/22 1800 Oral     SpO2 10/31/22 1758 95 %     Weight 10/31/22 1802 166 lb 6.4 oz (75.5 kg)     Height 10/31/22 1802 5\' 5"  (1.651 m)     Head Circumference --      [facility] Flow --      Pain Score 10/31/22 1801 6     Pain Loc --      Pain Education --      Exclude from Growth Chart --     Most recent vital signs: Vitals:   10/31/22 1758 10/31/22 1800  BP:  103/68  Pulse:  (!) 147  Resp:  18  Temp:  98 F (36.7 C)  SpO2: 95% 100%   General: Awake, alert. CV:  Good peripheral perfusion. Tachycardia, irregular rhythm. Resp:  Normal effort. Lungs clear. Abd:  No distention.  Other:  Skin tears to right side of face/neck and shoulder. Tenderness to palpation of the right shoulder.    ED Results / Procedures / Treatments   Labs (all labs ordered are listed, but only abnormal results are displayed) Labs Reviewed - No data to display   EKG  I, Phineas Semen, attending physician, personally viewed and  interpreted this EKG  EKG Time: 1805 Rate: 134 Rhythm: atrial fibrillation Axis: normal Intervals: qtc 460 QRS: narrow ST changes: no st elevation Impression: abnormal ekg    RADIOLOGY I independently interpreted and visualized the right shoulder. My interpretation: no fracture Radiology interpretation:  IMPRESSION:  Negative.    I independently interpreted and visualized the CT head/cervical spine. My interpretation: No intracranial bleed. No fracture Radiology interpretation:  IMPRESSION:  1. No acute intracranial abnormality.  2. No acute cervical spine fracture or traumatic listhesis.  3. Asymmetric soft tissue stranding surrounding the right parotid  gland with an overlying skin defect. Findings could be seen in the  setting of a parotid injury.  4. Small right pleural effusion, increased from prior exam.   I independently interpreted and visualized the CXR. My interpretation: No pneumonia Radiology interpretation:  IMPRESSION:  1. Slightly more prominent cardiac silhouette with underlying  pericardial effusion not excluded.  2. Likely trace bilateral pleural effusion.  3. Aortic Atherosclerosis (ICD10-I70.0).     PROCEDURES:  Critical Care  performed: Yes  CRITICAL CARE Performed by: Phineas Semen   Total critical care time: 35 minutes  Critical care time was exclusive of separately billable procedures and treating other patients.  Critical care was necessary to treat or prevent imminent or life-threatening deterioration.  Critical care was time spent personally by me on the following activities: development of treatment plan with patient and/or surrogate as well as nursing, discussions with consultants, evaluation of patient's response to treatment, examination of patient, obtaining history from patient or surrogate, ordering and performing treatments and interventions, ordering and review of laboratory studies, ordering and review of radiographic  studies, pulse oximetry and re-evaluation of patient's condition.   Procedures    MEDICATIONS ORDERED IN ED: Medications - No data to display   IMPRESSION / MDM / ASSESSMENT AND PLAN / ED COURSE  I reviewed the triage vital signs and the nursing notes.                              Differential diagnosis includes, but is not limited to, ICH, fracture, dislocation  Patient's presentation is most consistent with acute presentation with potential threat to life or bodily function.   The patient is on the cardiac monitor to evaluate for evidence of arrhythmia and/or significant heart rate changes.  Patient presented to the emergency department today because of concerns for an unwitnessed fall.  On exam patient has multiple skin tears to the right side of her face neck and right shoulder.  Extreme tenderness to palpation of the right shoulder.  Will obtain CT scans of the head and neck and right shoulder x-ray.  Additionally patient was found to be in A-fib with RVR.  Per chart review during recent hospitalization at [facility] she was having runs of narrow complex tachycardia although cardiology at that time thought it to be more likely SVT rather than atrial fibrillation because apparently was regular.  They did start her on metoprolol.  Patient's blood pressure is low here so would hesitate to give large dose of calcium or beta-blocker.  Did try giving a small dose of diltiazem however that did drop her blood pressure slightly.  Discussed with Dr. Bufford Buttner with cardiology.  Will plan on starting amiodarone.  In terms of the imaging studies no acute fracture or intracranial bleed.  There was some concern for possible parotid gland injury to her right cheek.  Discussed these findings with the patient's daughters.  After amiodarone was started heart rate did start to improve.  Discussed with Dr. Para March with the hospitalist service who will evaluate for admission.     FINAL CLINICAL IMPRESSION(S) / ED  DIAGNOSES   Final diagnoses:  Fall, initial encounter  Skin tear of right upper arm without complication, initial encounter  Atrial fibrillation, unspecified type [facility])     Note:  This document was prepared using Dragon voice recognition software and may include unintentional dictation errors.     Phineas Semen, MD 10/31/22 2300

## 2022-10-31 NOTE — Assessment & Plan Note (Addendum)
Multiple bruises and skin tears Possible parotid gland injury on CT Fall at rehab CT showing possible parotid injury-soft tissue stranding around right parotid gland CT neck nonacute Consider ENT referral Cool compresses and pain control Wound care

## 2022-10-31 NOTE — Assessment & Plan Note (Signed)
Clinically euvolemic Holding metoprolol, Lasix due to soft blood pressures

## 2022-10-31 NOTE — ED Notes (Signed)
Patient has multiple scattered skin tears across the l shoulder, l cheek and jaw, l mid back, and lower legs (Bilateral).

## 2022-10-31 NOTE — Assessment & Plan Note (Signed)
Currently on aspirin and atorvastatin

## 2022-10-31 NOTE — Assessment & Plan Note (Signed)
Hemoglobin 7.9, stable from discharge at 7.9 Continue to monitor

## 2022-10-31 NOTE — Assessment & Plan Note (Signed)
History of essential hypertension Very borderline BP, with systolic as low as 80s in the ED Hold home antihypertensives  As needed fluid boluses

## 2022-10-31 NOTE — Assessment & Plan Note (Addendum)
Recent paroxysmal SVT Patient had narrow complex tachycardia while at [facility] 7/26-8/1 which was treated as SVT versus A-fib Continue amiodarone infusion with close monitoring of blood pressure Not a candidate for systemic anticoagulation at this time due to recent CVA treated with tPA Cardiology consulted from the ED and will follow Patient had echocardiogram during recent hospitalization at [facility] from 7/26 to 8/1

## 2022-10-31 NOTE — ED Triage Notes (Signed)
Patient arrived to ED via [facility] from [facility] for a unwitnessed fall. Per EMS, new skin tears to the right face, shoulder, and arm.   Patient has old bruising generalized.  EMS states that she was diagnosed with a Stroke and AFIB 1xweek ago.

## 2022-10-31 NOTE — Assessment & Plan Note (Signed)
O2 sat dropped to 87% in the ED, in the setting of rapid A-fib Continue O2 to keep sats over 94%

## 2022-10-31 NOTE — ED Notes (Signed)
Per dr. Derrill Kay, only give 5 of dilt at this time.

## 2022-10-31 NOTE — H&P (Signed)
History and Physical    Patient: Carol Kent [id number]:[id number] DOB: 21-Jun-1940 DOA: 10/31/2022 DOS: the patient was seen and examined on 10/31/2022 PCP: [name], MD  Patient coming from: Home  Chief Complaint:  Chief Complaint  Patient presents with   Fall    HPI: Carol Kent is a 82 y.o. 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  Patient previously had several bruises following tPA administration at [facility].  She is at her baseline confusion with no new deficits. ED course and data review: On arrival she was noted to be in rapid A-fib with rate in the 140s to 150s with blood pressure borderline low at 103/68.  She was treated with a small Dilt bolus of 5 mg with hypotensive response to 98/57.  During treatment she became hypoxic to 87% and was placed on O2 at 2 L. The ED provider spoke with cardiology who recommended amiodarone bolus and infusion. Heart rate improved to the 120s on amiodarone infusion with BP of 115/61 by admission. She was not started on anticoagulation due to recent CVA on 7/26 treated with tPA Additional ED workup: Labs at baseline with hemoglobin 7.9, stable from discharge on 8/1, creatinine 1.53 slightly up from 1.43 at recent discharge.  Troponin 41, urinalysis clean. Chest x-ray showed likely trace bilateral pleural effusion Right shoulder x-ray negative for acute injury CT head nonacute but showing soft tissue stranding around the right parotid  gland--possible parotid injury CT C-spine nontraumatic Hospitalist consulted for admission.     Past Medical History:  Diagnosis Date   Brain aneurysm    CHF (congestive heart failure) (HCC)    Chronic kidney disease    History of cervical fracture    Past Surgical History:  Procedure Laterality Date   COLONOSCOPY WITH PROPOFOL N/A 11/14/2019   Procedure: COLONOSCOPY WITH PROPOFOL;  Surgeon: Wyline Mood, MD;  Location: [facility] ENDOSCOPY;  Service: Gastroenterology;  Laterality: N/A;   TUBAL LIGATION     Social History:  reports that she has never smoked. She has never used smokeless tobacco. She reports that she does not drink alcohol and does not use drugs.  Allergies  Allergen Reactions   Codeine Nausea Only    Other reaction(s): Nausea   Erythromycin Nausea Only   Fentanyl     Other reaction(s): Hallucination   Morphine And Codeine     Other reaction(s): Other (See Comments)   Orencia [Abatacept]    Propoxyphene Nausea Only    Other reaction(s): Nausea   Sulfa Antibiotics     Other reaction(s): Nausea   Other Rash    Pain med - can not remember name   Penicillins Rash    Other reaction(s): Hives    No family history on file.  Prior to Admission medications   Medication Sig Start Date End Date Taking? Authorizing Provider  Ascorbic Acid (VITAMIN C) 1000 MG tablet Take 1,000 mg by mouth daily.   Yes [provider]  aspirin 81 MG chewable tablet Chew 81 mg by mouth daily.  Yes [provider]  atorvastatin (LIPITOR) 40 MG tablet Take 40 mg by mouth in the morning.   Yes [provider]  barrier cream (NON-SPECIFIED) CREA Apply 1 Application topically 3 (three) times daily.   Yes [provider]  bisacodyl (DULCOLAX) 10 MG suppository Place 10 mg rectally daily as needed for moderate constipation.   Yes [provider]  bisacodyl (FLEET) 10 MG/30ML ENEM Place 10 mg rectally daily as needed (severe constipation).   Yes  [provider]  Bismuth Tribromoph-Petrolatum (XEROFORM PETROLAT PATCH 4"X4") PADS Apply 1 patch topically daily. 06/22/22 11/07/30 Yes Georga Hacking, MD  EPINEPHrine 0.3 mg/0.3 mL IJ SOAJ injection Inject 0.3 mg into the skin as directed. 06/07/20  Yes [provider]  ferrous sulfate 325 (65 FE) MG EC tablet Take 325 mg by mouth in the morning.   Yes [provider]  furosemide (LASIX) 40 MG tablet Take 1 tablet by mouth 3 times a week. Patient taking differently: Take 40 mg by mouth daily. 08/07/22  Yes Hammock, Lavonna Rua, NP  hydroxychloroquine (PLAQUENIL) 200 MG tablet Take 200 mg by mouth 2 (two) times daily.   Yes [provider]  magnesium hydroxide (MILK OF MAGNESIA) 400 MG/5ML suspension Take 30 mLs by mouth daily as needed for mild constipation.   Yes [provider]  memantine (NAMENDA) 10 MG tablet Take 10 mg by mouth 2 (two) times daily.   Yes [provider]  metoprolol tartrate (LOPRESSOR) 25 MG tablet Take 25 mg by mouth 4 (four) times daily as needed (palpitations).   Yes [provider]  mirtazapine (REMERON) 7.5 MG tablet Take 7.5 mg by mouth at bedtime.   Yes [provider]  potassium chloride (KLOR-CON) 10 MEQ tablet Take 10 mEq by mouth in the morning.   Yes [provider]  predniSONE (DELTASONE) 1 MG tablet Take 6 mg by mouth daily with breakfast.   Yes [provider]  vitamin B-12 1000 MCG tablet Take 1 tablet (1,000 mcg total) by mouth daily. 11/18/19  Yes Alford Highland, MD    Physical Exam: Vitals:   10/31/22 2215 10/31/22 2230 10/31/22 2300 10/31/22 2315  BP:  (!) 105/53 115/61 (!) 89/57  Pulse: (!) 117 (!) 128 (!) 124 (!) 115  Resp: (!) 33 (!) 28 (!) 23 (!) 23  Temp:      TempSrc:      SpO2: 100% 100% 100% 100%  Weight:      Height:       Physical Exam Vitals and nursing note reviewed.  Constitutional:      General: She is not in acute distress. HENT:     Head:  Normocephalic.     Comments: Large bruise right angle of jaw Cardiovascular:     Rate and Rhythm: Tachycardia present. Rhythm irregular.     Heart sounds: Normal heart sounds.  Pulmonary:     Effort: Pulmonary effort is normal.     Breath sounds: Normal breath sounds.  Abdominal:     Palpations: Abdomen is soft.     Tenderness: There is no abdominal tenderness.  Skin:    Comments: Multiple bruises of different stages on all limbs, upper chest and right side of face  Neurological:     Mental Status: Mental status is at baseline.     Labs on Admission: I have personally reviewed following labs and imaging studies  CBC: Recent Labs  Lab 10/31/22 1817  WBC 9.4  NEUTROABS 7.6  HGB 7.9*  HCT 23.9*  MCV 89.8  PLT 351   Basic Metabolic Panel: Recent Labs  Lab 10/31/22 1817  NA 138  K 3.6  CL 107  CO2 20*  GLUCOSE 112*  BUN 26*  CREATININE 1.53*  CALCIUM 8.1*   GFR: Estimated Creatinine Clearance: 27.8 mL/min (A) (by C-G formula based on SCr of 1.53 mg/dL (H)). Liver Function Tests: No results for input(s): "AST", "ALT", "ALKPHOS", "BILITOT", "PROT", "ALBUMIN" in the last 168 hours. No results for input(s): "LIPASE", "AMYLASE" in the last 168 hours. No results for input(s): "AMMONIA" in the last 168 hours. Coagulation Profile: No results for input(s): "INR", "PROTIME" in the last 168 hours. Cardiac Enzymes: No results for input(s): "CKTOTAL", "CKMB", "CKMBINDEX", "TROPONINI" in the last 168 hours. BNP (last 3 results) No results for input(s): "PROBNP" in the last 8760 hours. HbA1C: No results for input(s): "HGBA1C" in the last 72 hours. CBG: No results for input(s): "GLUCAP" in the last 168 hours. Lipid Profile: No results for input(s): "CHOL", "HDL", "LDLCALC", "TRIG", "CHOLHDL", "LDLDIRECT" in the last 72 hours. Thyroid Function Tests: No results for input(s): "TSH", "T4TOTAL", "FREET4", "T3FREE", "THYROIDAB" in the last 72 hours. Anemia Panel: No results for  input(s): "VITAMINB12", "FOLATE", "FERRITIN", "TIBC", "IRON", "RETICCTPCT" in the last 72 hours. Urine analysis:    Component Value Date/Time   COLORURINE YELLOW (A) 10/31/2022 2241   APPEARANCEUR CLEAR (A) 10/31/2022 2241   LABSPEC 1.012 10/31/2022 2241   PHURINE 5.0 10/31/2022 2241   GLUCOSEU NEGATIVE 10/31/2022 2241   HGBUR NEGATIVE 10/31/2022 2241   BILIRUBINUR NEGATIVE 10/31/2022 2241   KETONESUR NEGATIVE 10/31/2022 2241   PROTEINUR NEGATIVE 10/31/2022 2241   NITRITE NEGATIVE 10/31/2022 2241   LEUKOCYTESUR TRACE (A) 10/31/2022 2241    Radiological Exams on Admission: DG Chest Portable 1 View  Result Date: 10/31/2022 CLINICAL DATA:  hypoxia EXAM: PORTABLE CHEST 1 VIEW COMPARISON:  Chest x-ray 10/05/2022 FINDINGS: Wireless device overlies left chest. Slightly more prominent cardiac silhouette. Wise the heart and mediastinal contours are unchanged. Aortic calcification. No focal consolidation. Chronic coarsened interstitial markings with no overt pulmonary edema. Likely trace bilateral pleural effusion. No pneumothorax. No acute osseous abnormality. IMPRESSION: 1. Slightly more prominent cardiac silhouette with underlying pericardial effusion not excluded. 2. Likely trace bilateral pleural effusion. 3. Aortic Atherosclerosis (ICD10-I70.0). Electronically Signed   By: Tish Frederickson M.D.   On: 10/31/2022 20:46   DG Shoulder Right  Result Date: 10/31/2022 CLINICAL DATA:  Unwitnessed fall, right shoulder injury EXAM: RIGHT SHOULDER - 2+ VIEW COMPARISON:  None Available. FINDINGS: There is no evidence of fracture or dislocation. There is no evidence of arthropathy or other focal bone abnormality. Soft tissues are unremarkable. IMPRESSION: Negative. Electronically Signed   By: Helyn Numbers M.D.   On: 10/31/2022 19:04   CT Head Wo Contrast  Result Date: 10/31/2022 CLINICAL DATA:  fall EXAM: CT HEAD WITHOUT CONTRAST CT CERVICAL SPINE WITHOUT CONTRAST TECHNIQUE: Multidetector CT imaging of the  head and cervical spine was performed following the standard protocol without intravenous contrast. Multiplanar CT image reconstructions of the cervical spine were also generated. RADIATION DOSE REDUCTION: This exam was performed according to the departmental dose-optimization program which includes automated exposure control, adjustment of the mA and/or kV according to patient size and/or use of iterative reconstruction technique. COMPARISON:  CT head and C Spine 10/05/22 FINDINGS: CT HEAD FINDINGS Brain: Encephalomalacia in the right anterior temporal lobe, likely postsurgical. No hemorrhage. No hydrocephalus. No extra-axial fluid collection. No CT evidence of an acute cortical infarct. Vascular:  No hyperdense vessel or unexpected calcification. Skull: There are postsurgical changes from a right pterional craniotomy. Sinuses/Orbits: No middle ear or mastoid effusion. Paranasal sinuses are clear. Bilateral lens replacement. Orbits are otherwise unremarkable. Other: None. CT CERVICAL SPINE FINDINGS Alignment: Normal. Skull base and vertebrae: No acute fracture. No primary bone lesion or focal pathologic process. Unchanged appearance of the superior endplate of C7, T2, and T3. Soft tissues and spinal canal: No prevertebral fluid or swelling. No visible canal hematoma. Disc levels:  No evidence of high-grade spinal canal stenosis. Upper chest: Small right pleural effusion, increased prior exam. Other: Asymmetric soft tissue stranding surrounding the right parotid gland with an overlying skin defect. Findings could be seen in the setting of a parotid injury. IMPRESSION: 1. No acute intracranial abnormality. 2. No acute cervical spine fracture or traumatic listhesis. 3. Asymmetric soft tissue stranding surrounding the right parotid gland with an overlying skin defect. Findings could be seen in the setting of a parotid injury. 4. Small right pleural effusion, increased from prior exam. Electronically Signed   By: Lorenza Cambridge M.D.   On: 10/31/2022 18:57   CT Cervical Spine Wo Contrast  Result Date: 10/31/2022 CLINICAL DATA:  fall EXAM: CT HEAD WITHOUT CONTRAST CT CERVICAL SPINE WITHOUT CONTRAST TECHNIQUE: Multidetector CT imaging of the head and cervical spine was performed following the standard protocol without intravenous contrast. Multiplanar CT image reconstructions of the cervical spine were also generated. RADIATION DOSE REDUCTION: This exam was performed according to the departmental dose-optimization program which includes automated exposure control, adjustment of the mA and/or kV according to patient size and/or use of iterative reconstruction technique. COMPARISON:  CT head and C Spine 10/05/22 FINDINGS: CT HEAD FINDINGS Brain: Encephalomalacia in the right anterior temporal lobe, likely postsurgical. No hemorrhage. No hydrocephalus. No extra-axial fluid collection. No CT evidence of an acute cortical infarct. Vascular: No hyperdense vessel or unexpected calcification. Skull: There are postsurgical changes from a right pterional craniotomy. Sinuses/Orbits: No middle ear or mastoid effusion. Paranasal sinuses are clear. Bilateral lens replacement. Orbits are otherwise unremarkable. Other: None. CT CERVICAL SPINE FINDINGS Alignment: Normal. Skull base and vertebrae: No acute fracture. No primary bone lesion or focal pathologic process. Unchanged appearance of the superior endplate of C7, T2, and T3. Soft tissues and spinal canal: No prevertebral fluid or swelling. No visible canal hematoma. Disc levels:  No evidence of high-grade spinal canal stenosis. Upper chest: Small right pleural effusion, increased prior exam. Other: Asymmetric soft tissue stranding surrounding the right parotid gland with an overlying skin defect. Findings could be seen in the setting of a parotid injury. IMPRESSION: 1. No acute intracranial abnormality. 2. No acute cervical spine fracture or traumatic listhesis. 3. Asymmetric soft tissue stranding  surrounding the right parotid gland with an overlying skin defect. Findings could be seen in the setting of a parotid injury. 4. Small right pleural effusion, increased from prior exam. Electronically Signed   By: Lorenza Cambridge M.D.   On: 10/31/2022 18:57     Data Reviewed: Relevant notes from primary care and specialist visits, past discharge summaries as available in EHR, including Care Everywhere. Prior diagnostic testing as pertinent to current admission diagnoses Updated medications and problem lists for reconciliation ED course, including vitals, labs, imaging, treatment and response to treatment Triage notes, nursing and pharmacy notes and ED provider's notes Notable results as noted in HPI   Assessment and Plan: * Atrial fibrillation with rapid ventricular response (HCC) Recent paroxysmal SVT Patient had narrow complex tachycardia  while at [facility] 7/26-8/1 which was treated as SVT versus A-fib Continue amiodarone infusion with close monitoring of blood pressure Not a candidate for systemic anticoagulation at this time due to recent CVA treated with tPA Cardiology consulted from the ED and will follow Patient had echocardiogram during recent hospitalization at [facility] from 7/26 to 8/1  Hypotension History of essential hypertension Very borderline BP, with systolic as low as 80s in the ED Hold home antihypertensives  As needed fluid boluses  Acute respiratory failure with hypoxia (HCC) O2 sat dropped to 87% in the ED, in the setting of rapid A-fib Continue O2 to keep sats over 94%  Ischemic stroke 10/24/22 s/p tPA and mechanical thrombectomy Currently on aspirin and atorvastatin  Anemia Hemoglobin 7.9, stable from discharge at 7.9 Continue to monitor  Facial contusion, initial encounter Multiple bruises and skin tears Possible parotid gland injury on CT Fall at rehab CT showing possible parotid injury-soft tissue stranding around right parotid gland CT neck nonacute Consider  ENT referral Cool compresses and pain control Wound care  Chronic diastolic CHF (congestive heart failure) (HCC) Clinically euvolemic Holding metoprolol, Lasix due to soft blood pressures  Stage 3a chronic kidney disease (HCC) Renal function at baseline around 1.53  Rheumatoid arthritis (HCC) Continue Plaquenil and prednisone  Vascular dementia, unspecified severity, with anxiety (HCC) Continue mirtazapine and Namenda     DVT prophylaxis: SCD  Consults: Cardiology [facility]  Advance Care Planning:   Code Status: Prior   Family Communication: 2 daughters at bedside  Disposition Plan: Back to previous home environment  Severity of Illness: The appropriate patient status for this patient is INPATIENT. Inpatient status is judged to be reasonable and necessary in order to provide the required intensity of service to ensure the patient's safety. The patient's presenting symptoms, physical exam findings, and initial radiographic and laboratory data in the context of their chronic comorbidities is felt to place them at high risk for further clinical deterioration. Furthermore, it is not anticipated that the patient will be medically stable for discharge from the hospital within 2 midnights of admission.   * I certify that at the point of admission it is my clinical judgment that the patient will require inpatient hospital care spanning beyond 2 midnights from the point of admission due to high intensity of service, high risk for further deterioration and high frequency of surveillance required.*  Author: Andris Baumann, MD 10/31/2022 11:21 PM  For on call review [email or web address].

## 2022-10-31 NOTE — ED Notes (Signed)
Pts wounds irrigated and re-dressed by RN and EDT. Incontinence care performed and new linens and chux lined on bed. Pt placed in a clean brief and purewick set up to catch a urine sample. Pt repositioned in bed and provided with new warm blankets per request.

## 2022-10-31 NOTE — ED Notes (Signed)
Attempted to call report at this time 

## 2022-11-01 ENCOUNTER — Inpatient Hospital Stay: Payer: Medicare Other

## 2022-11-01 ENCOUNTER — Encounter: Payer: Self-pay | Admitting: Internal Medicine

## 2022-11-01 DIAGNOSIS — J9601 Acute respiratory failure with hypoxia: Secondary | ICD-10-CM | POA: Diagnosis not present

## 2022-11-01 DIAGNOSIS — N1831 Chronic kidney disease, stage 3a: Secondary | ICD-10-CM | POA: Diagnosis not present

## 2022-11-01 DIAGNOSIS — Z8673 Personal history of transient ischemic attack (TIA), and cerebral infarction without residual deficits: Secondary | ICD-10-CM | POA: Diagnosis not present

## 2022-11-01 DIAGNOSIS — F0154 Vascular dementia, unspecified severity, with anxiety: Secondary | ICD-10-CM

## 2022-11-01 DIAGNOSIS — I959 Hypotension, unspecified: Secondary | ICD-10-CM | POA: Diagnosis not present

## 2022-11-01 DIAGNOSIS — I4891 Unspecified atrial fibrillation: Secondary | ICD-10-CM | POA: Diagnosis not present

## 2022-11-01 LAB — MAGNESIUM: Magnesium: 1.8 mg/dL (ref 1.7–2.4)

## 2022-11-01 LAB — MRSA NEXT GEN BY PCR, NASAL: MRSA by PCR Next Gen: NOT DETECTED

## 2022-11-01 MED ORDER — SODIUM CHLORIDE 0.9 % IV BOLUS
500.0000 mL | Freq: Once | INTRAVENOUS | Status: AC
  Administered 2022-11-01: 500 mL via INTRAVENOUS

## 2022-11-01 MED ORDER — MIDODRINE HCL 5 MG PO TABS
5.0000 mg | ORAL_TABLET | Freq: Three times a day (TID) | ORAL | Status: DC
  Administered 2022-11-01 – 2022-11-02 (×2): 5 mg via ORAL
  Filled 2022-11-01 (×2): qty 1

## 2022-11-01 MED ORDER — MAGNESIUM OXIDE -MG SUPPLEMENT 400 (240 MG) MG PO TABS
400.0000 mg | ORAL_TABLET | Freq: Once | ORAL | Status: AC
  Administered 2022-11-01: 400 mg via ORAL
  Filled 2022-11-01: qty 1

## 2022-11-01 MED ORDER — CHLORHEXIDINE GLUCONATE CLOTH 2 % EX PADS
6.0000 | MEDICATED_PAD | Freq: Every day | CUTANEOUS | Status: DC
  Administered 2022-11-01 – 2022-11-03 (×2): 6 via TOPICAL

## 2022-11-01 MED ORDER — ORAL CARE MOUTH RINSE: 15.0000 mL | OROMUCOSAL | Status: DC | PRN

## 2022-11-01 MED ORDER — ENSURE ENLIVE PO LIQD
237.0000 mL | Freq: Two times a day (BID) | ORAL | Status: DC
  Administered 2022-11-01 – 2022-11-02 (×3): 237 mL via ORAL

## 2022-11-01 MED ORDER — MAGNESIUM SULFATE 2 GM/50ML IV SOLN
2.0000 g | Freq: Once | INTRAVENOUS | Status: DC
  Filled 2022-11-01 (×2): qty 50

## 2022-11-01 MED ORDER — SODIUM CHLORIDE 0.9 % IV SOLN: INTRAVENOUS | Status: AC

## 2022-11-01 MED ORDER — ZINC OXIDE 40 % EX OINT
TOPICAL_OINTMENT | Freq: Three times a day (TID) | CUTANEOUS | Status: DC
  Administered 2022-11-01: 1 via TOPICAL
  Filled 2022-11-01: qty 113

## 2022-11-01 NOTE — TOC Initial Note (Signed)
Transition of Care [facility]) - Initial/Assessment Note    Patient Details  Name: Carol Kent MRN: [id number] Date of Birth: [birth year]  Transition of [facility]) CM/SW Contact:    Colette Ribas, LCSWA Phone Number: 11/01/2022, 2:53 PM  Clinical Narrative:                   Patient is a readmit under 30 days. Readmission prevention screen completed 10/06/2022. CSW spoke with daughter at bedside. CSW introduced role and explained that discharge planning would be discussed. PCP is [name], MD. Daughter drives her to appointments. [facility] [address]. No issues obtaining medications. Patient lives home alone. However she was at [employer] prior to admission. Patient was active with [facility] for RN and ST. She has a rollator, elevated toilet seat, and shower chair at home. Discussed SNF recommendation. Daughter denied SNF, and prefers DC with [facility] and a private aid. CSW encouraged patient's daughter to contact CSW as needed. CSW will continue to follow patient and her daughter for support and facilitate discharge once medically stable.          Expected Discharge Plan:  (TBD) Barriers to Discharge: Continued Medical Work up       Patient Goals and CMS Choice            Expected Discharge Plan and Services                                              Prior Living Arrangements/Services                       Activities of Daily Living Home Assistive Devices/Equipment: Environmental consultant (specify type), Dentures (specify type) ADL Screening (condition at time of admission) Patient's cognitive ability adequate to safely complete daily activities?: No Is the patient deaf or have difficulty hearing?: No Does the patient have difficulty seeing, even when wearing glasses/contacts?: No Does the patient have difficulty concentrating, remembering, or making decisions?: Yes Patient able to express need for assistance with ADLs?: No Does the patient  have difficulty dressing or bathing?: Yes Independently performs ADLs?: No Communication: Independent Dressing (OT): Needs assistance Is this a change from baseline?: Pre-admission baseline Grooming: Needs assistance Is this a change from baseline?: Pre-admission baseline Feeding: Needs assistance Is this a change from baseline?: Pre-admission baseline Bathing: Needs assistance Is this a change from baseline?: Pre-admission baseline Toileting: Needs assistance Is this a change from baseline?: Pre-admission baseline In/Out Bed: Needs assistance Is this a change from baseline?: Pre-admission baseline Walks in Home: Needs assistance Is this a change from baseline?: Pre-admission baseline Does the patient have difficulty walking or climbing stairs?: Yes Weakness of Legs: Both Weakness of Arms/Hands: Both  Permission Sought/Granted                  Emotional Assessment              Admission diagnosis:  Atrial fibrillation with rapid ventricular response (HCC) [I48.91] Fall, initial encounter [W19.XXXA] Skin tear of right upper arm without complication, initial encounter [S41.111A] Atrial fibrillation, unspecified type [facility]) [I48.91] Patient Active Problem List   Diagnosis Date Noted   Atrial fibrillation with rapid ventricular response (HCC) 10/31/2022   Acute respiratory failure with hypoxia (HCC) 10/31/2022   Ischemic stroke 10/24/22 s/p tPA and mechanical thrombectomy 10/31/2022  Stage 3a chronic kidney disease (HCC) 10/31/2022   Anemia 10/31/2022   Fall at home, initial encounter 10/31/2022   Multiple bruises 10/31/2022   Facial contusion, initial encounter 10/31/2022   Hypotension 10/31/2022   Hypertension 10/06/2022   UTI (urinary tract infection) 10/05/2022   Falls 10/05/2022   (HFpEF) heart failure with preserved ejection fraction (HCC) 10/05/2022   Venous stasis dermatitis 10/05/2022   Vascular dementia, unspecified severity, with anxiety (HCC) 08/27/2022    Cellulitis of lower extremity 05/27/2022   CKD (chronic kidney disease), stage IV (HCC) 05/27/2022   Elevated lactic acid level 05/27/2022   Acute metabolic encephalopathy 05/27/2022   Chronic diastolic CHF (congestive heart failure) (HCC) 05/27/2022   AKI (acute kidney injury) (HCC) 11/04/2020   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    Pancytopenia (HCC)    Acute kidney injury superimposed on CKD (HCC) 11/13/2019   Hypokalemia 11/13/2019   Diarrhea 11/13/2019   LLQ pain 11/13/2019   Rheumatoid arthritis (HCC) 11/13/2019   PCP:  [name], MD Pharmacy:  No Pharmacies Listed    Social Determinants of Health (SDOH) Social History: SDOH Screenings   Food Insecurity: Patient Declined (11/01/2022)  Housing: Patient Declined (11/01/2022)  Transportation Needs: Patient Declined (11/01/2022)  Utilities: Patient Declined (11/01/2022)  Financial Resource Strain: Low Risk  (10/29/2022)   Received from [facility]  Tobacco Use: Low Risk  (11/01/2022)  Recent Concern: Tobacco Use - Medium Risk (09/04/2022)   [facility]   SDOH Interventions:     Readmission Risk Interventions    11/05/2020    1:56 PM  Readmission Risk Prevention Plan  Transportation Screening Complete  PCP or Specialist Appt within 3-5 Days Complete  HRI or Home Care Consult Complete  Social Work Consult for Recovery Care Planning/Counseling Complete  Palliative Care Screening Not Applicable  Medication Review [employer]) Complete

## 2022-11-01 NOTE — Plan of Care (Addendum)
Patient admitted from the ED 0005. Patient drowsy but arousable, follows simple commands. RR upper 20s, expiratory wheezes noted. HR Afib RVR 100s-120s, BP soft but MAP greater than 65. Multiple wounds cleansed and dressed. Purewick changed. No voids since admission, bladder scan at 0500 shows . Amiodarone infusing at ordered rate. Patient receiving rehab services s/p stroke admission at [facility] 7/26-8/1. Orders placed for ST/PT/OT to continue. No acute events since CCU admission.  Problem: Clinical Measurements: Goal: Ability to maintain clinical measurements within normal limits will improve 11/01/2022 0512 by Alice Reichert, RN Outcome: Not Progressing 11/01/2022 0133 by Alice Reichert, RN Outcome: Progressing Goal: Will remain free from infection 11/01/2022 0512 by Alice Reichert, RN Outcome: Not Progressing 11/01/2022 0133 by Alice Reichert, RN Outcome: Progressing Goal: Diagnostic test results will improve 11/01/2022 0512 by Alice Reichert, RN Outcome: Not Progressing 11/01/2022 0133 by Alice Reichert, RN Outcome: Progressing Goal: Respiratory complications will improve 11/01/2022 0512 by Alice Reichert, RN Outcome: Not Progressing 11/01/2022 0133 by Alice Reichert, RN Outcome: Progressing Goal: Cardiovascular complication will be avoided 11/01/2022 0512 by Alice Reichert, RN Outcome: Not Progressing 11/01/2022 0133 by Alice Reichert, RN Outcome: Progressing   Problem: Nutrition: Goal: Adequate nutrition will be maintained 11/01/2022 0512 by Alice Reichert, RN Outcome: Not Progressing 11/01/2022 0133 by Alice Reichert, RN Outcome: Progressing   Problem: Elimination: Goal: Will not experience complications related to bowel motility 11/01/2022 0512 by Alice Reichert, RN Outcome: Not Progressing 11/01/2022 0133 by Alice Reichert, RN Outcome: Progressing Goal: Will not experience complications related to urinary retention 11/01/2022 0512 by Alice Reichert,  RN Outcome: Not Progressing 11/01/2022 0133 by Alice Reichert, RN Outcome: Progressing   Problem: Pain Managment: Goal: General experience of comfort will improve 11/01/2022 0512 by Alice Reichert, RN Outcome: Not Progressing 11/01/2022 0133 by Alice Reichert, RN Outcome: Progressing   Problem: Safety: Goal: Ability to remain free from injury will improve 11/01/2022 0512 by Alice Reichert, RN Outcome: Not Progressing 11/01/2022 0133 by Alice Reichert, RN Outcome: Progressing   Problem: Skin Integrity: Goal: Risk for impaired skin integrity will decrease 11/01/2022 0512 by Alice Reichert, RN Outcome: Not Progressing 11/01/2022 0133 by Alice Reichert, RN Outcome: Progressing   Problem: Cardiac: Goal: Ability to achieve and maintain adequate cardiopulmonary perfusion will improve 11/01/2022 0512 by Alice Reichert, RN Outcome: Not Progressing 11/01/2022 0133 by Alice Reichert, RN Outcome: Progressing

## 2022-11-01 NOTE — Progress Notes (Addendum)
1       [place] at [facility]   PATIENT NAME: Carol Kent    MR#:  [id number]  PCP: [name], MD  DATE OF BIRTH:  1940/07/02  SUBJECTIVE:  CHIEF COMPLAINT:   Chief Complaint  Patient presents with   Fall  Denies any new complaints. REVIEW OF SYSTEMS:  Review of Systems  Constitutional:  Positive for malaise/fatigue.  Respiratory:  Positive for shortness of breath.   Musculoskeletal:  Positive for back pain, falls and joint pain.  Psychiatric/Behavioral:  Positive for memory loss.   All other systems reviewed and are negative.  DRUG ALLERGIES:   Allergies  Allergen Reactions   Codeine Nausea Only    Other reaction(s): Nausea   Erythromycin Nausea Only   Fentanyl     Other reaction(s): Hallucination   Morphine And Codeine     Other reaction(s): Other (See Comments)   Orencia [Abatacept]    Propoxyphene Nausea Only    Other reaction(s): Nausea   Sulfa Antibiotics     Other reaction(s): Nausea   Other Rash    Pain med - can not remember name   Penicillins Rash    Other reaction(s): Hives   VITALS:  Blood pressure (!) 92/55, pulse (!) 108, temperature (!) 97.4 F (36.3 C), temperature source Oral, resp. rate (!) 21, height 5\' 5"  (1.651 m), weight 69.7 kg, SpO2 98%. PHYSICAL EXAMINATION:  Physical Exam Constitutional:      Appearance: She is ill-appearing.  HENT:     Head: Normocephalic.     Nose: Nose normal.     Mouth/Throat:     Mouth: Mucous membranes are moist.  Eyes:     Extraocular Movements: Extraocular movements intact.     Conjunctiva/sclera: Conjunctivae normal.     Pupils: Pupils are equal, round, and reactive to light.  Cardiovascular:     Rate and Rhythm: Tachycardia present. Rhythm irregular.  Pulmonary:     Effort: Pulmonary effort is normal.     Breath sounds: Normal breath sounds.  Abdominal:     General: Bowel sounds are normal.     Palpations: Abdomen is soft.  Musculoskeletal:        General: Normal range of  motion.     Cervical back: Normal range of motion.  Skin:    Findings: Bruising present.     Comments: Bruising all over the body from the fall  Neurological:     General: No focal deficit present.     Mental Status: She is alert. Mental status is at baseline.  Psychiatric:        Mood and Affect: Mood normal.    LABORATORY PANEL:  Female CBC Recent Labs  Lab 10/31/22 1817  WBC 9.4  HGB 7.9*  HCT 23.9*  PLT 351   ------------------------------------------------------------------------------------------------------------------ Chemistries  Recent Labs  Lab 10/31/22 1817 11/01/22 1335  NA 138  --   K 3.6  --   CL 107  --   CO2 20*  --   GLUCOSE 112*  --   BUN 26*  --   CREATININE 1.53*  --   CALCIUM 8.1*  --   MG  --  1.8   MEDICATIONS:  Scheduled Meds:  aspirin  81 mg Oral Daily   atorvastatin  40 mg Oral q AM   Chlorhexidine Gluconate Cloth  6 each Topical Daily   cyanocobalamin  1,000 mcg Oral Daily   feeding supplement  237 mL Oral BID BM   ferrous sulfate  325  mg Oral q AM   hydroxychloroquine  200 mg Oral BID   liver oil-zinc oxide   Topical TID   memantine  10 mg Oral BID   mirtazapine  7.5 mg Oral QHS   predniSONE  6 mg Oral Q breakfast   Continuous Infusions:  amiodarone 30 mg/hr (11/01/22 1220)   RADIOLOGY:  DG Chest Portable 1 View  Result Date: 10/31/2022 CLINICAL DATA:  hypoxia EXAM: PORTABLE CHEST 1 VIEW COMPARISON:  Chest x-ray 10/05/2022 FINDINGS: Wireless device overlies left chest. Slightly more prominent cardiac silhouette. Wise the heart and mediastinal contours are unchanged. Aortic calcification. No focal consolidation. Chronic coarsened interstitial markings with no overt pulmonary edema. Likely trace bilateral pleural effusion. No pneumothorax. No acute osseous abnormality. IMPRESSION: 1. Slightly more prominent cardiac silhouette with underlying pericardial effusion not excluded. 2. Likely trace bilateral pleural effusion. 3. Aortic  Atherosclerosis (ICD10-I70.0). Electronically Signed   By: Tish Frederickson M.D.   On: 10/31/2022 20:46   DG Shoulder Right  Result Date: 10/31/2022 CLINICAL DATA:  Unwitnessed fall, right shoulder injury EXAM: RIGHT SHOULDER - 2+ VIEW COMPARISON:  None Available. FINDINGS: There is no evidence of fracture or dislocation. There is no evidence of arthropathy or other focal bone abnormality. Soft tissues are unremarkable. IMPRESSION: Negative. Electronically Signed   By: Helyn Numbers M.D.   On: 10/31/2022 19:04   CT Head Wo Contrast  Result Date: 10/31/2022 CLINICAL DATA:  fall EXAM: CT HEAD WITHOUT CONTRAST CT CERVICAL SPINE WITHOUT CONTRAST TECHNIQUE: Multidetector CT imaging of the head and cervical spine was performed following the standard protocol without intravenous contrast. Multiplanar CT image reconstructions of the cervical spine were also generated. RADIATION DOSE REDUCTION: This exam was performed according to the departmental dose-optimization program which includes automated exposure control, adjustment of the mA and/or kV according to patient size and/or use of iterative reconstruction technique. COMPARISON:  CT head and C Spine 10/05/22 FINDINGS: CT HEAD FINDINGS Brain: Encephalomalacia in the right anterior temporal lobe, likely postsurgical. No hemorrhage. No hydrocephalus. No extra-axial fluid collection. No CT evidence of an acute cortical infarct. Vascular: No hyperdense vessel or unexpected calcification. Skull: There are postsurgical changes from a right pterional craniotomy. Sinuses/Orbits: No middle ear or mastoid effusion. Paranasal sinuses are clear. Bilateral lens replacement. Orbits are otherwise unremarkable. Other: None. CT CERVICAL SPINE FINDINGS Alignment: Normal. Skull base and vertebrae: No acute fracture. No primary bone lesion or focal pathologic process. Unchanged appearance of the superior endplate of C7, T2, and T3. Soft tissues and spinal canal: No prevertebral fluid or  swelling. No visible canal hematoma. Disc levels:  No evidence of high-grade spinal canal stenosis. Upper chest: Small right pleural effusion, increased prior exam. Other: Asymmetric soft tissue stranding surrounding the right parotid gland with an overlying skin defect. Findings could be seen in the setting of a parotid injury. IMPRESSION: 1. No acute intracranial abnormality. 2. No acute cervical spine fracture or traumatic listhesis. 3. Asymmetric soft tissue stranding surrounding the right parotid gland with an overlying skin defect. Findings could be seen in the setting of a parotid injury. 4. Small right pleural effusion, increased from prior exam. Electronically Signed   By: Lorenza Cambridge M.D.   On: 10/31/2022 18:57   CT Cervical Spine Wo Contrast  Result Date: 10/31/2022 CLINICAL DATA:  fall EXAM: CT HEAD WITHOUT CONTRAST CT CERVICAL SPINE WITHOUT CONTRAST TECHNIQUE: Multidetector CT imaging of the head and cervical spine was performed following the standard protocol without intravenous contrast. Multiplanar CT image reconstructions of  the cervical spine were also generated. RADIATION DOSE REDUCTION: This exam was performed according to the departmental dose-optimization program which includes automated exposure control, adjustment of the mA and/or kV according to patient size and/or use of iterative reconstruction technique. COMPARISON:  CT head and C Spine 10/05/22 FINDINGS: CT HEAD FINDINGS Brain: Encephalomalacia in the right anterior temporal lobe, likely postsurgical. No hemorrhage. No hydrocephalus. No extra-axial fluid collection. No CT evidence of an acute cortical infarct. Vascular: No hyperdense vessel or unexpected calcification. Skull: There are postsurgical changes from a right pterional craniotomy. Sinuses/Orbits: No middle ear or mastoid effusion. Paranasal sinuses are clear. Bilateral lens replacement. Orbits are otherwise unremarkable. Other: None. CT CERVICAL SPINE FINDINGS Alignment:  Normal. Skull base and vertebrae: No acute fracture. No primary bone lesion or focal pathologic process. Unchanged appearance of the superior endplate of C7, T2, and T3. Soft tissues and spinal canal: No prevertebral fluid or swelling. No visible canal hematoma. Disc levels:  No evidence of high-grade spinal canal stenosis. Upper chest: Small right pleural effusion, increased prior exam. Other: Asymmetric soft tissue stranding surrounding the right parotid gland with an overlying skin defect. Findings could be seen in the setting of a parotid injury. IMPRESSION: 1. No acute intracranial abnormality. 2. No acute cervical spine fracture or traumatic listhesis. 3. Asymmetric soft tissue stranding surrounding the right parotid gland with an overlying skin defect. Findings could be seen in the setting of a parotid injury. 4. Small right pleural effusion, increased from prior exam. Electronically Signed   By: Lorenza Cambridge M.D.   On: 10/31/2022 18:57   ASSESSMENT AND PLAN:   82 y.o. 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 treated with tPA and mechanical thrombectomy, discharged on aspirin and atorvastatin, noted to have intermittent narrow complex tachycardia treated as paroxysmal SVT with metoprolol and placed on Zio patch with cardiology f admitted status post fall at rehab in which she sustained multiple skin tears and a facial contusion   Principal Problem:   Atrial fibrillation with rapid ventricular response (HCC) Active Problems:   Acute respiratory failure with hypoxia (HCC)   Hypotension   Ischemic stroke 10/24/22 s/p tPA and mechanical thrombectomy   Anemia   Chronic diastolic CHF (congestive heart failure) (HCC)   Multiple bruises   Facial contusion, initial encounter   Fall at home, initial encounter   Stage 3a chronic  kidney disease (HCC)   Rheumatoid arthritis (HCC)   Vascular dementia, unspecified severity, with anxiety (HCC)   Hypertension  * Atrial fibrillation with rapid ventricular response (HCC) Recent paroxysmal SVT Patient had narrow complex tachycardia while at [facility] 7/26-8/1 which was treated as SVT versus A-fib Continue amiodarone infusion with close monitoring of blood pressure Not a candidate for systemic anticoagulation at this time due to recent CVA treated with tPA Cardiology seen and appreciate input Patient had echocardiogram during recent hospitalization at [facility] from 7/26 to 8/1   Hypotension History of essential hypertension Very borderline BP, with systolic as low as 80s  Hold home antihypertensives  As needed fluid boluses. Added midodrine and NS @ 75   Acute respiratory failure with hypoxia (HCC) O2 sat dropped to 87% in the ED, in the setting of rapid A-fib Continue O2 to keep sats over 94%.  Currently on room air   Ischemic stroke 10/24/22 s/p tPA and mechanical thrombectomy  Currently on aspirin and atorvastatin   Anemia Hemoglobin 7.9, stable from discharge at 7.9 Continue to monitor   Facial contusion, initial encounter Multiple bruises and skin tears Possible parotid gland injury on CT Fall at rehab CT showing possible parotid injury-soft tissue stranding around right parotid gland CT neck nonacute Consider ENT referral Cool compresses and pain control Wound care   Chronic diastolic CHF (congestive heart failure) (HCC) Clinically euvolemic Holding metoprolol, Lasix due to soft blood pressures   Stage 3a chronic kidney disease (HCC) Renal function at baseline around 1.53   Rheumatoid arthritis (HCC) Continue Plaquenil and prednisone   Vascular dementia, unspecified severity, with anxiety (HCC) Continue mirtazapine and Namenda   Body mass index is 25.57 kg/m.  Net IO Since Admission: 334.8 mL [11/01/22 1424]      LOS: 1 day    Consultants: Cardiology Palliative care   Status is: Inpatient Remains inpatient appropriate because: Management of A-fib with RVR   DVT prophylaxis:       SCDs Start: 10/31/22 2325     Family Communication: Updated daughter over phone   All the records are reviewed and case discussed with Nursing and TOC team. Management plans discussed with the patient, family/daughter and they are in agreement.  CODE STATUS: DNR Level of care: Progressive  TOTAL TIME TAKING CARE OF THIS PATIENT: 35 minutes.   More than 50% of the time was spent in counseling/coordination of care: YES  POSSIBLE D/C IN 1-2 DAYS, DEPENDING ON CLINICAL CONDITION.   Delfino Lovett M.D on 11/01/2022 at 2:24 PM  [facility]   CC: Primary care physician; [name], MD  Note: This dictation was prepared with Dragon dictation along with smaller phrase technology. Any transcriptional errors that result from this process are unintentional.

## 2022-11-01 NOTE — Progress Notes (Addendum)
PHARMACY CONSULT NOTE - FOLLOW UP  Pharmacy Consult for Electrolyte Monitoring and Replacement   Recent Labs: Potassium (mmol/L)  Date Value  10/31/2022 3.6   Magnesium (mg/dL)  Date Value  21/30/8657 1.8   Calcium (mg/dL)  Date Value  84/69/6295 8.1 (L)   Albumin (g/dL)  Date Value  28/41/3244 2.6 (L)   Phosphorus (mg/dL)  Date Value  04/02/7251 3.6   Sodium (mmol/L)  Date Value  10/31/2022 138   Assessment: 82 y/o female with PMH of HTN, afib (no AC), HLD, CKD III, RA, recent stroke (09/2022) and cognitive impairment admitted for fall at rehab and currently being treated for recent paroxysmal SVT. Pharmacy has been consulted to replace electrolytes.  Goal of Therapy:  Potassium ? 3.5 Magnesium ? 1.9 Phosphate ? 2.4 Corrected Calcium ? 8.0  Plan:  Give magnesium 2 g IV x1 Re-check electrolytes in AM  _______________________________________________________________ 11/01/22 1845 Update  Per RN, IV access lost. Will switch to oral repletion with magnesium oxide 400 mg x1 tonight, per Dr. Sherryll Burger.  Thank you for involving pharmacy in this patient's care.   Rockwell Alexandria, PharmD Clinical Pharmacist 11/01/2022 6:46 PM
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
## 2022-11-01 NOTE — Evaluation (Signed)
Clinical/Bedside Swallow Evaluation Patient Details  Name: Carol Kent MRN: [id number] Date of Birth: 12/08/1940  Today's Date: 11/01/2022 Time: SLP Start Time (ACUTE ONLY): 0915 SLP Stop Time (ACUTE ONLY): 1015 SLP Time Calculation (min) (ACUTE ONLY): 60 min  Past Medical History:  Past Medical History:  Diagnosis Date   Brain aneurysm    CHF (congestive heart failure) (HCC)    Chronic kidney disease    History of cervical fracture    Past Surgical History:  Past Surgical History:  Procedure Laterality Date   COLONOSCOPY WITH PROPOFOL N/A 11/14/2019   Procedure: COLONOSCOPY WITH PROPOFOL;  Surgeon: Wyline Mood, MD;  Location: [facility] ENDOSCOPY;  Service: Gastroenterology;  Laterality: N/A;   TUBAL LIGATION     HPI:  Per admitting H&P "Carol Kent is a 82 y.o. 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  Patient previously had several bruises following tPA administration at [facility].  She is at her baseline confusion with no new deficits.  ED course and data review: On arrival she was noted to be in rapid A-fib with rate in the 140s to 150s with blood pressure borderline low at 103/68.  She was treated with a small Dilt bolus of 5 mg with hypotensive response to 98/57.  During treatment she became hypoxic to 87% and was placed on O2 at 2 L.  The ED provider spoke with cardiology who recommended amiodarone bolus and infusion.  Heart rate improved to the 120s on amiodarone infusion  with BP of 115/61 by admission.  She was not started on anticoagulation due to recent CVA on 7/26 treated with tPA  Additional ED workup: Labs at baseline with hemoglobin 7.9, stable from discharge on 8/1, creatinine 1.53 slightly up from 1.43 at recent discharge.  Troponin 41, urinalysis clean.  Chest x-ray showed likely trace bilateral pleural effusion  Right shoulder x-ray negative for acute injury  CT head nonacute but showing soft tissue stranding around the right parotid gland--possible parotid injury  CT C-spine nontraumatic  Hospitalist consulted for admission.    Assessment / Plan / Recommendation  Clinical Impression     Pt presents with mild dysphagia at bedside. Pt was pleasant and cooperative, daughter present and supportive during assessment. Pt was awake but often kept her eyes closed during assessment. Oral mech exam revealed structures to be functioning adequately with the exception of overall weakness/fatigue. Noted significant bruising on her face from fall at rehab facility. Upper dentures fit well but lower dentures somewhat loose. Daughter reports Pt was on a "soft diet" after her recent CVA approximately a week ago. Pt tolerated thin liquids given by single sips from the straw but was noted to clear her throat and had a delayed congested cough after multiple sips from the straw. Pt tolerated purees well. Significant oral transit delay with solids although Pt was able to eventually clear oral cavity of any residue given extended time. It should be noted that Pt has a congested productive cough even before any PO intake. Daughter reports this cough started  within the last week. Rec diet downgrade to Dys 2 (for easier mastication with less fatigue) with thin liquids with aspiration precautions. Discussed with daughter who reports PO intake has been very limited in the last week since CVA. Rec Pt be allowed to have food brought in by daughter. ST to follow up and upgarde to Dys 3 as Pt gains  strength.  SLP Visit Diagnosis: Dysphagia, oropharyngeal phase (R13.12)    Aspiration Risk  Mild aspiration risk    Diet Recommendation Dysphagia 2 (Fine chop);Thin liquid    Liquid Administration via: Straw;Other (Comment) (single sips only) Medication Administration: Crushed with puree Supervision: Staff to assist with self feeding Compensations: Slow rate;Small sips/bites;Follow solids with liquid Postural Changes: Seated upright at 90 degrees;Remain upright for at least 30 minutes after po intake    Other  Recommendations Oral Care Recommendations: Oral care BID    Recommendations for follow up therapy are one component of a multi-disciplinary discharge planning process, led by the attending physician.  Recommendations may be updated based on patient status, additional functional criteria and insurance authorization.  Follow up Recommendations Follow physician's recommendations for discharge plan and follow up therapies      Assistance Recommended at Discharge  Supervision  Functional Status Assessment    Frequency and Duration min 3x week  1 week       Prognosis Prognosis for improved oropharyngeal function: Good      Swallow Study   General Date of Onset: 10/31/22 HPI: Per admitting H&P "Carol Kent is a 82 y.o. 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  Patient previously had several bruises following tPA administration at [facility].  She is at her baseline  confusion with no new deficits.  ED course and data review: On arrival she was noted to be in rapid A-fib with rate in the 140s to 150s with blood pressure borderline low at 103/68.  She was treated with a small Dilt bolus of 5 mg with hypotensive response to 98/57.  During treatment she became hypoxic to 87% and was placed on O2 at 2 L.  The ED provider spoke with cardiology who recommended amiodarone bolus and infusion.  Heart rate improved to the 120s on amiodarone infusion with BP of 115/61 by admission.  She was not started on anticoagulation due to recent CVA on 7/26 treated with tPA  Additional ED workup: Labs at baseline with hemoglobin 7.9, stable from discharge on 8/1, creatinine 1.53 slightly up from 1.43 at recent discharge.  Troponin 41, urinalysis clean.  Chest x-ray showed likely trace bilateral pleural effusion  Right shoulder x-ray negative for acute injury  CT head nonacute but showing soft tissue stranding around the right parotid gland--possible parotid injury  CT C-spine nontraumatic  Hospitalist consulted for admission. Type of Study: Bedside Swallow Evaluation Diet Prior to this Study:  (regular) Respiratory Status: Room air History of Recent Intubation: No Behavior/Cognition: Alert;Cooperative;Pleasant mood Oral Care Completed by SLP: No Oral Cavity - Dentition: Dentures, top;Dentures, bottom;Other (Comment) (Bottom loose) Vision: Functional for self-feeding  Self-Feeding Abilities: Needs assist Patient Positioning: Upright in bed Baseline Vocal Quality: Low vocal intensity Volitional Cough: Congested    Oral/Motor/Sensory Function Overall Oral Motor/Sensory Function: Within functional limits   Ice Chips Ice chips: Not tested   Thin Liquid Thin Liquid: Within functional limits Presentation: Cup;Straw    Nectar Thick Nectar Thick Liquid: Not tested   Honey Thick Honey Thick Liquid: Not tested   Puree Puree: Within functional limits Presentation: Spoon   Solid      Solid: Impaired Presentation: Self Fed Oral Phase Impairments: Impaired mastication;Reduced lingual movement/coordination Oral Phase Functional Implications: Prolonged oral transit;Impaired mastication Pharyngeal Phase Impairments: Multiple swallows      Eather Colas 11/01/2022,10:26 AM

## 2022-11-01 NOTE — Evaluation (Signed)
Occupational Therapy Evaluation Patient Details Name: Carol Kent MRN: [id number] DOB: Jan 23, 1941 Today's Date: 11/01/2022   History of Present Illness 82 y.o. 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  Patient previously had several bruises following tPA administration at [facility].  She is at her baseline confusion with no new deficits.   Clinical Impression   Patient presenting with decreased Ind in self care,balance, functional mobility/transfers, endurance, and safety awareness. Patient reports appears confused and reports living at home with mother and siblings and year as "75". Per chart review, pt furniture walks at home and needs assistance with self care and IADLs tasks at baseline.  Patient currently functioning at mod A for supine >sit with assistance for B LEs and trunk support. Pt sitting for ~ 5 minutes with supervision - min guard. Pt declines attempts to stand and reports fatigue with sitting. HR ranges from 112-130 bpm with activity this session.  Patient will benefit from acute OT to increase overall independence in the areas of ADLs, functional mobility, and safety awareness  in order to safely discharge.     Recommendations for follow up therapy are one component of a multi-disciplinary discharge planning process, led by the attending physician.  Recommendations may be updated based on patient status, additional functional criteria and insurance authorization.    Assistance Recommended at Discharge Frequent or constant Supervision/Assistance  Patient can return home with the following A lot of help with walking and/or transfers;A lot of help with bathing/dressing/bathroom;Assistance with cooking/housework;Assist for transportation;Help with stairs or ramp for entrance    Functional Status Assessment  Patient has had a recent decline in their functional status and demonstrates the ability to make significant improvements in function in a reasonable and predictable amount of time.  Equipment Recommendations  Other (comment) (defer to next venue of care)       Precautions / Restrictions Precautions Precautions: Fall Precaution Comments: watch HR      Mobility Bed Mobility Overal bed mobility: Needs Assistance Bed Mobility: Supine to Sit, Sit to Supine     Supine to sit: Mod assist Sit to supine: Mod assist   General bed mobility comments: assistance for trunk support and LEs    Transfers                   General transfer comment: Pt declined      Balance Overall balance assessment: Needs assistance Sitting-balance support: Feet supported, Bilateral upper extremity supported Sitting balance-Leahy Scale: Fair                                     ADL either performed or assessed with clinical judgement   ADL Overall ADL's : Needs assistance/impaired  Vision Patient Visual Report: No change from baseline              Pertinent Vitals/Pain Pain Assessment Pain Assessment: Faces Faces Pain Scale: Hurts a little bit Pain Location: generalized Pain Descriptors / Indicators: Discomfort Pain Intervention(s): Monitored during session, Repositioned     Hand Dominance Right   Extremity/Trunk Assessment Upper Extremity Assessment Upper Extremity Assessment: Generalized weakness   Lower Extremity Assessment Lower Extremity Assessment:  Generalized weakness       Communication Communication Communication: No difficulties   Cognition Arousal/Alertness: Awake/alert Behavior During Therapy: Flat affect Overall Cognitive Status: No family/caregiver present to determine baseline cognitive functioning                                 General Comments: Pt is oriented to self and location. She reports it is "71" and that she lives at home with her mother and siblings. Home set up and PLOF obtained via last admission ~ 3wks ago when family was present. Follows simple commands with increased time.                Home Living Family/patient expects to be discharged to:: Skilled nursing facility                                        Prior Functioning/Environment Prior Level of Function : Needs assist             Mobility Comments: Per last admission, pt has AD but will not use for household ambulation. Furniture walks instead. ADLs Comments: supervision for safety with bathing but Ind with toileting and UB ADLs. Assistance for LB self care and IADLS.        OT Problem List: Decreased strength;Decreased range of motion;Decreased activity tolerance;Decreased safety awareness;Impaired balance (sitting and/or standing);Decreased knowledge of use of DME or AE      OT Treatment/Interventions: Self-care/ADL training;Therapeutic exercise;Therapeutic activities;Energy conservation;DME and/or AE instruction;Patient/family education;Balance training    OT Goals(Current goals can be found in the care plan section) Acute Rehab OT Goals Patient Stated Goal: to get stronger and go home OT Goal Formulation: With patient Time For Goal Achievement: 11/15/22 Potential to Achieve Goals: Fair ADL Goals Pt Will Perform Grooming: with min assist;standing Pt Will Perform Lower Body Dressing: with min assist;sit to/from stand Pt Will Transfer to Toilet: with min assist;ambulating Pt Will Perform  Toileting - Clothing Manipulation and hygiene: with min assist;sit to/from stand  OT Frequency: Min 1X/week       AM-PAC OT "6 Clicks" Daily Activity     Outcome Measure Help from another person eating meals?: A Little Help from another person taking care of personal grooming?: A Little Help from another person toileting, which includes using toliet, bedpan, or urinal?: A Lot Help from another person bathing (including washing, rinsing, drying)?: A Lot Help from another person to put on and taking off regular upper body clothing?: A Lot Help from another person to put on and taking off regular lower body clothing?: A Lot 6 Click Score: 14   End of Session    Activity Tolerance: Patient limited by fatigue Patient left: in bed;with call bell/phone within reach;with bed alarm set  OT Visit Diagnosis: Unsteadiness on feet (R26.81);Muscle weakness (generalized) (M62.81);History of falling (Z91.81)  Time: 4098-1191 OT Time Calculation (min): 13 min Charges:  OT General Charges $OT Visit: 1 Visit OT Evaluation $OT Eval Moderate Complexity: 1 Mod OT Treatments $Therapeutic Activity: 8-22 mins  Jackquline Denmark, MS, OTR/L , CBIS ascom [phone]  11/01/22, 11:04 AM

## 2022-11-01 NOTE — Progress Notes (Signed)
PT Cancellation Note  Patient Details Name: RAEVYN SOKOL MRN: [id number] DOB: [birth year]   Cancelled Treatment:    Reason Eval/Treat Not Completed: Patient not medically ready.  PT consult received.  Chart reviewed.  Per discussion with pt's nurse, will hold PT at this time (until HR is more controlled).  Will re-attempt PT evaluation at a later date/time.  Hendricks Limes, PT 11/01/22, 10:22 AM

## 2022-11-01 NOTE — Consult Note (Addendum)
WOC Nurse Consult Note: Reason for Consult:Multiple bruises and skin tears., Nursing notes right face, neck, back, left thigh. Guidance is provided by standing order skin care order set and I have provided that among other orders for PI prevention today Wound type:trauma Pressure Injury POA: N/A Measurement:Bedside RN to measure and document measurements on Nursing Flow Sheet with application of next dressings today Wound bed:red, moist Drainage (amount, consistency, odor) small to scant serosanguinous Periwound: bruising Dressing procedure/placement/frequency: I have provifded Nursing with guidance for topical care of the lesions using a NS cleanse daily and pat dry followed by placement of white petrolatum dressings secured/covered with dry gauze paper tape or silicone foam. Turning and repositioning is in place and while she is in ICU she is on a mattress replacement with low air loss feature. A sacral foam is to be placed and heels placed into [employer] Boots for PI prevention.  WOC nursing team will not follow, but will remain available to this patient, the nursing and medical teams.  Please re-consult if needed.  Thank you for inviting [place] to participate in this patient's Plan of Care.  Ladona Mow, MSN, RN, CNS, GNP, Leda Min, [employer], [employer] phone:  [phone]

## 2022-11-01 NOTE — Consult Note (Signed)
Consultation Note Date: 11/01/2022   Patient Name: Carol Kent  DOB: [birth year]  MRN: [id number]  Age / Sex: 82 y.o., female  PCP: [name], MD Referring Physician: Delfino Lovett, MD  Reason for Consultation: Establishing goals of care   HPI/Brief Hospital Course: 82 y.o. female  with past medical history of HTN, HLD, RA, CKD, cognitive impairment and CHF admitted from [facility] on 10/31/2022 with unwitnessed fall resulting in facial skin tears and c/o right shoulder pain.  Found to be in A. Fib RVR in ED-received diltiazem resulting in hypotension, bloused and started on amiodarone infusion-rate improved  Shoulder xray negative for acute process  Noted multiple hospitalizations since beginning of July  7/7-7/10 admitted for fall, found to have UTI-contributing to acute delirium with underlying cognitive impairment-d/c to [facility] at that time  7/26-8/1 admission to [facility] for A. Fib/CVA-treated with tPA and mechanical thrombectomy, again d/c'd to [facility]   Palliative medicine was consulted for assisting with goals of care conversations  Subjective:  Extensive chart review has been completed prior to meeting patient including labs, vital signs, imaging, progress notes, orders, and available advanced directive documents from current and previous encounters.  Visited with Carol Kent at her bedside. Drowsy, able to open eyes and acknowledge my presence in room, able to correctly identify place but drifts back off to sleep without constant redirection-not able to participate in goals of care conversations at this time. Daughters-Carol Kent and Elon Jester and bedside during time of visit.  Introduced myself as a Publishing rights manager as a member of the palliative care team. Explained palliative medicine is specialized medical care for people living with serious illness. It focuses on providing relief from the symptoms and stress of a serious illness. The goal  is to improve quality of life for both the patient and the family.   Both daughters able to provide a brief life review. Carol Kent is local and is the primary caregiver for Carol Kent, Denicola lives at [facility] but visits frequently. Carol Kent is documented HCPOA. AD emailed to be uploaded to EMR.  Prior to initial admission in early July, Carol Kent lived at home alone independently with Carol Kent checking in on her multiple times throughout the day and night. She was able to ambulate without assistance, required help with hygiene and meal preparation. Carol Kent shares a noted decline in mentation over the last several weeks involving inability to recognize both daughters and having likely visual hallucination-communicating with family members that have previously passed.  Both daughters share their frustrations with care that has been provided at [facility]. At discharge they wish to have Carol Kent return home with care aids present 24/7, willing to privately pay-TOC to assist with d/c planning.  Attempted to elicit goals of care. 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 She shares she and her sister have recently discussed this and at this time wish to establish Do Not Resuscitate. Clarified their understanding of allowing for a natural death in the event of loss of pulse and/or not breathing. Also verified DNI-Carol Kent reviews her mothers Living Will which states she would not want to be placed on mechanical ventilation for which both daughters wish to honor. DNR/DNI order placed and golden form completed and uploaded to EMR.  I discussed importance of continued conversations with family/support persons and all members of their medical team regarding overall plan of care and treatment options ensuring decisions are in alignment with patients goals of care.  All questions/concerns addressed. Emotional support provided to  patient/family/support persons. PMT will continue to follow and support patient as needed.  Objective: Primary Diagnoses: Present on Admission:  Atrial fibrillation with rapid ventricular response (HCC)  Chronic diastolic CHF (congestive heart failure) (HCC)  Rheumatoid arthritis (HCC)  Vascular dementia, unspecified severity, with anxiety (HCC)  Hypertension   Vital Signs: BP 92/65   Pulse 100   Temp 98.8 F (37.1 C) (Oral)   Resp (!) 24   Ht 5\' 5"  (1.651 m)   Wt 69.7 kg   SpO2 99%   BMI 25.57 kg/m  Pain Scale: PAINAD   Pain Score: Asleep   IO: Intake/output summary:  Intake/Output Summary (Last 24 hours) at 11/01/2022 1837 Last data filed at 11/01/2022 1700 Gross per 24 hour  Intake 1103.99 ml  Output 545 ml  Net 558.99 ml    LBM: Last BM Date :  (pta) Baseline Weight: Weight: 75.5 kg Most recent weight: Weight: 69.7 kg      Assessment and Plan  SUMMARY OF RECOMMENDATIONS   DNR/DNI PMT to continue to follow for ongoing needs and support  Discussed With: Primary team and nursing staff   Thank you for this consult and allowing Palliative Medicine to participate in the care of Carol Kent. Palliative medicine will continue to follow and assist as needed.   Time Total: 75 minutes  Time spent includes: Detailed review of medical records (labs, imaging, vital signs), medically appropriate exam (mental status, respiratory, cardiac, skin), discussed with treatment team, counseling and educating patient, family and staff, documenting clinical information, medication management and coordination of care.   Signed by: Leeanne Deed, DNP, AGNP-C Palliative Medicine    Please contact Palliative Medicine Team phone at [phone] for questions and concerns.  For individual provider: See Loretha Stapler

## 2022-11-02 ENCOUNTER — Other Ambulatory Visit: Payer: Self-pay

## 2022-11-02 ENCOUNTER — Inpatient Hospital Stay: Payer: Medicare Other

## 2022-11-02 DIAGNOSIS — W19XXXA Unspecified fall, initial encounter: Secondary | ICD-10-CM

## 2022-11-02 DIAGNOSIS — Z8673 Personal history of transient ischemic attack (TIA), and cerebral infarction without residual deficits: Secondary | ICD-10-CM

## 2022-11-02 DIAGNOSIS — S41111A Laceration without foreign body of right upper arm, initial encounter: Secondary | ICD-10-CM | POA: Diagnosis not present

## 2022-11-02 DIAGNOSIS — I5032 Chronic diastolic (congestive) heart failure: Secondary | ICD-10-CM | POA: Diagnosis not present

## 2022-11-02 DIAGNOSIS — Z7189 Other specified counseling: Secondary | ICD-10-CM | POA: Diagnosis not present

## 2022-11-02 DIAGNOSIS — I959 Hypotension, unspecified: Secondary | ICD-10-CM | POA: Diagnosis not present

## 2022-11-02 DIAGNOSIS — Z515 Encounter for palliative care: Secondary | ICD-10-CM

## 2022-11-02 DIAGNOSIS — J9601 Acute respiratory failure with hypoxia: Secondary | ICD-10-CM | POA: Diagnosis not present

## 2022-11-02 DIAGNOSIS — I4891 Unspecified atrial fibrillation: Secondary | ICD-10-CM

## 2022-11-02 LAB — BASIC METABOLIC PANEL WITH GFR
Anion gap: 9 (ref 5–15)
BUN: 25 mg/dL — ABNORMAL HIGH (ref 8–23)
CO2: 20 mmol/L — ABNORMAL LOW (ref 22–32)
Calcium: 7.6 mg/dL — ABNORMAL LOW (ref 8.9–10.3)
Chloride: 110 mmol/L (ref 98–111)
Creatinine, Ser: 1.35 mg/dL — ABNORMAL HIGH (ref 0.44–1.00)
GFR, Estimated: 39 mL/min — ABNORMAL LOW (ref >60)
Glucose, Bld: 90 mg/dL (ref 70–99)
Potassium: 3.1 mmol/L — ABNORMAL LOW (ref 3.5–5.1)
Sodium: 139 mmol/L (ref 135–145)

## 2022-11-02 LAB — OCCULT BLOOD X 1 CARD TO LAB, STOOL: Fecal Occult Bld: NEGATIVE

## 2022-11-02 LAB — PHOSPHORUS: Phosphorus: 2.5 mg/dL (ref 2.5–4.6)

## 2022-11-02 MED ORDER — HALOPERIDOL LACTATE 5 MG/ML IJ SOLN
2.0000 mg | INTRAMUSCULAR | Status: DC | PRN
  Administered 2022-11-03: 2 mg via INTRAMUSCULAR
  Filled 2022-11-02: qty 1

## 2022-11-02 MED ORDER — LORAZEPAM 0.5 MG PO TABS
0.5000 mg | ORAL_TABLET | ORAL | Status: DC | PRN
  Administered 2022-11-02 – 2022-11-03 (×4): 0.5 mg via ORAL
  Filled 2022-11-02 (×4): qty 1

## 2022-11-02 MED ORDER — ACETAMINOPHEN 650 MG RE SUPP: 650.0000 mg | Freq: Four times a day (QID) | RECTAL | Status: DC | PRN

## 2022-11-02 MED ORDER — MORPHINE SULFATE 10 MG/5ML PO SOLN: 5.0000 mg | ORAL | Status: DC | PRN

## 2022-11-02 MED ORDER — MORPHINE SULFATE (CONCENTRATE) 10 MG/0.5ML PO SOLN: 5.0000 mg | ORAL | Status: DC | PRN

## 2022-11-02 MED ORDER — MORPHINE SULFATE (PF) 2 MG/ML IV SOLN: 0.5000 mg | Freq: Once | INTRAVENOUS | Status: DC

## 2022-11-02 MED ORDER — RISPERIDONE 1 MG PO TBDP
0.5000 mg | ORAL_TABLET | Freq: Two times a day (BID) | ORAL | Status: DC
  Administered 2022-11-02 – 2022-11-03 (×2): 0.5 mg via ORAL
  Filled 2022-11-02 (×2): qty 0.5

## 2022-11-02 MED ORDER — DIPHENHYDRAMINE HCL 50 MG/ML IJ SOLN: 25.0000 mg | INTRAMUSCULAR | Status: DC | PRN

## 2022-11-02 MED ORDER — ACETAMINOPHEN 325 MG PO TABS: 650.0000 mg | ORAL_TABLET | Freq: Four times a day (QID) | ORAL | Status: DC | PRN

## 2022-11-02 MED ORDER — GLYCOPYRROLATE 1 MG PO TABS: 1.0000 mg | ORAL_TABLET | ORAL | Status: DC | PRN

## 2022-11-02 MED ORDER — METOPROLOL TARTRATE 25 MG PO TABS
12.5000 mg | ORAL_TABLET | Freq: Two times a day (BID) | ORAL | Status: DC
  Administered 2022-11-02 – 2022-11-03 (×3): 12.5 mg via ORAL
  Filled 2022-11-02 (×3): qty 1

## 2022-11-02 MED ORDER — HALOPERIDOL LACTATE 5 MG/ML IJ SOLN: 4.0000 mg | INTRAMUSCULAR | Status: DC | PRN

## 2022-11-02 MED ORDER — POTASSIUM CHLORIDE CRYS ER 20 MEQ PO TBCR
40.0000 meq | EXTENDED_RELEASE_TABLET | Freq: Once | ORAL | Status: AC
  Administered 2022-11-02: 40 meq via ORAL
  Filled 2022-11-02: qty 2

## 2022-11-02 MED ORDER — GLYCOPYRROLATE 0.2 MG/ML IJ SOLN
0.2000 mg | INTRAMUSCULAR | Status: DC | PRN
  Administered 2022-11-03: 0.2 mg via SUBCUTANEOUS
  Filled 2022-11-02: qty 1

## 2022-11-02 MED ORDER — MIDODRINE HCL 5 MG PO TABS
10.0000 mg | ORAL_TABLET | Freq: Three times a day (TID) | ORAL | Status: DC
  Administered 2022-11-03: 10 mg via ORAL
  Filled 2022-11-02: qty 2

## 2022-11-02 MED ORDER — ONDANSETRON 4 MG PO TBDP: 4.0000 mg | ORAL_TABLET | Freq: Four times a day (QID) | ORAL | Status: DC | PRN

## 2022-11-02 MED ORDER — GLYCOPYRROLATE 0.2 MG/ML IJ SOLN: 0.2000 mg | INTRAMUSCULAR | Status: DC | PRN

## 2022-11-02 MED ORDER — MIDODRINE HCL 5 MG PO TABS
10.0000 mg | ORAL_TABLET | Freq: Three times a day (TID) | ORAL | Status: DC
  Administered 2022-11-02: 10 mg via ORAL
  Filled 2022-11-02: qty 2

## 2022-11-02 MED ORDER — HALOPERIDOL LACTATE 5 MG/ML IJ SOLN: 2.5000 mg | INTRAMUSCULAR | Status: DC | PRN

## 2022-11-02 MED ORDER — POLYVINYL ALCOHOL 1.4 % OP SOLN: 1.0000 [drp] | Freq: Four times a day (QID) | OPHTHALMIC | Status: DC | PRN

## 2022-11-02 MED ORDER — ONDANSETRON HCL 4 MG/2ML IJ SOLN: 4.0000 mg | Freq: Four times a day (QID) | INTRAMUSCULAR | Status: DC | PRN

## 2022-11-02 NOTE — IPAL (Signed)
  Interdisciplinary Goals of Care Family Meeting   Date carried out: 11/02/2022  Location of the meeting: Bedside  Member's involved: Physician and Family Member or next of kin  Durable Power of Attorney or Environmental health practitioner: Daughters  Discussion: We discussed goals of care for Carol Kent   Code status:   Code Status: DNR   Disposition: Home with Hospice  Time spent for the meeting: 35 minutes    Delfino Lovett, MD  11/02/2022, 12:15 PM

## 2022-11-02 NOTE — Progress Notes (Signed)
Patient has been awake and confused for the duration of the shift thus far. Intermittently tearful, asking about "little daughter." Only U/S IV removed by patient along with monitoring equipment. One small tarry stool. Patient received TPA recently at [facility]. Dr. Para March notified. Awaiting further orders.

## 2022-11-02 NOTE — Progress Notes (Signed)
PT Cancellation Note  Patient Details Name: JEYDI KLINGEL MRN: [id number] DOB: 15-Jan-1941   Cancelled Treatment:    Reason Eval/Treat Not Completed: Other (comment).  PT order cancelled.  Hendricks Limes, PT 11/02/22, 5:46 PM

## 2022-11-02 NOTE — Progress Notes (Signed)
PT Cancellation Note  Patient Details Name: Carol Kent MRN: [id number] DOB: Dec 04, 1940   Cancelled Treatment:    Reason Eval/Treat Not Completed: Patient not medically ready.  Nurse recommending holding PT today d/t pt's current medical status.  Will re-attempt PT evaluation tomorrow.  Hendricks Limes, PT 11/02/22, 10:22 AM

## 2022-11-02 NOTE — Progress Notes (Signed)
A consult was placed for PIV access while waiting for PICC line insertion;  upon arrival to the patient's room, MD was speaking with family ; per Dr Sherryll Burger, will hold on placing further ivs or a PICC at this time;  pt with multiple skin tears and bruises on both extremities.

## 2022-11-02 NOTE — TOC Progression Note (Addendum)
CSW received consult from MD, patient's family wants discharge with Home Hospice. They requested [facility], called liaison Dahlia Client [phone]. She requested DME orders, H &P, Clinicals and DC orders. Faxed information to [phone], advised Dahlia Client DC expected date is tomorrow which is when doctor will submit orders.  2:00PM: CSW received call from NP Ladona Ridgel advised family would like options on Hospice. CSW reached out to Daughter she confirmed wanting options, advised her [facility] Liaison Annice Pih will come speak with the family, also advised daughter of Genevieve Norlander.

## 2022-11-02 NOTE — Progress Notes (Addendum)
[facility] Liaison Note:  Received a request from [facility], Eulogio Ditch, Theresia Majors, to speak with the patient's daughters about Hospice services in the home.  HL met with patient's daughters at the bedside and provided extensive education about Hospice services.    Daughters would like additional time to consider options before proceeding.  All questions answered and no concerns voiced.    At this time, patient is not under review for [facility] services as family continues to have ongoing GOC discussions.  [facility] information and contact numbers given to daughters and above information was shared with the TOC.    Please call with any questions/concerns.  Thank you for the opportunity to participate in this patient's care.  [facility] Liaison [phone]

## 2022-11-02 NOTE — Progress Notes (Signed)
1       [place] at [facility]   PATIENT NAME: Carol Kent    MR#:  [id number]  PCP: [name], MD  DATE OF BIRTH:  05/24/1940  SUBJECTIVE:  CHIEF COMPLAINT:   Chief Complaint  Patient presents with   Fall  Has intermittent agitation and confusion, short of breath REVIEW OF SYSTEMS:  Review of Systems  Constitutional:  Positive for malaise/fatigue.  Respiratory:  Positive for shortness of breath.   Musculoskeletal:  Positive for back pain, falls and joint pain.  Psychiatric/Behavioral:  Positive for hallucinations and memory loss. The patient is nervous/anxious.   All other systems reviewed and are negative.  DRUG ALLERGIES:   Allergies  Allergen Reactions   Codeine Nausea Only    Other reaction(s): Nausea   Erythromycin Nausea Only   Fentanyl     Other reaction(s): Hallucination   Morphine And Codeine     Other reaction(s): Other (See Comments)   Orencia [Abatacept]    Propoxyphene Nausea Only    Other reaction(s): Nausea   Sulfa Antibiotics     Other reaction(s): Nausea   Other Rash    Pain med - can not remember name   Penicillins Rash    Other reaction(s): Hives   VITALS:  Blood pressure (!) 87/71, pulse (!) 112, temperature (!) 97.5 F (36.4 C), resp. rate (!) 28, height 5\' 5"  (1.651 m), weight 69.7 kg, SpO2 100%. PHYSICAL EXAMINATION:  Physical Exam Constitutional:      Appearance: She is ill-appearing.  HENT:     Head: Normocephalic.     Nose: Nose normal.     Mouth/Throat:     Mouth: Mucous membranes are moist.  Eyes:     Extraocular Movements: Extraocular movements intact.     Conjunctiva/sclera: Conjunctivae normal.     Pupils: Pupils are equal, round, and reactive to light.  Cardiovascular:     Rate and Rhythm: Tachycardia present. Rhythm irregular.  Pulmonary:     Effort: Pulmonary effort is normal.     Breath sounds: Normal breath sounds.  Abdominal:     General: Bowel sounds are normal.     Palpations: Abdomen is  soft.  Musculoskeletal:        General: Normal range of motion.     Cervical back: Normal range of motion.  Skin:    Findings: Bruising present.     Comments: Bruising all over the body from the fall  Neurological:     General: No focal deficit present.     Mental Status: She is alert. Mental status is at baseline.  Psychiatric:        Mood and Affect: Mood normal.    LABORATORY PANEL:  Female CBC Recent Labs  Lab 10/31/22 1817  WBC 9.4  HGB 7.9*  HCT 23.9*  PLT 351   ------------------------------------------------------------------------------------------------------------------ Chemistries  Recent Labs  Lab 11/01/22 1335 11/02/22 0618  NA  --  139  K  --  3.1*  CL  --  110  CO2  --  20*  GLUCOSE  --  90  BUN  --  25*  CREATININE  --  1.35*  CALCIUM  --  7.6*  MG 1.8  --    MEDICATIONS:  Scheduled Meds:  aspirin  81 mg Oral Daily   Chlorhexidine Gluconate Cloth  6 each Topical Daily   cyanocobalamin  1,000 mcg Oral Daily   feeding supplement  237 mL Oral BID BM   ferrous sulfate  325 mg Oral  q AM   hydroxychloroquine  200 mg Oral BID   liver oil-zinc oxide   Topical TID   memantine  10 mg Oral BID   metoprolol tartrate  12.5 mg Oral BID   mirtazapine  7.5 mg Oral QHS    morphine injection  0.5 mg Intravenous Once   predniSONE  6 mg Oral Q breakfast   risperiDONE  0.5 mg Oral BID   Continuous Infusions:   RADIOLOGY:  DG Chest Port 1 View  Result Date: 11/02/2022 CLINICAL DATA:  Short of breath. EXAM: PORTABLE CHEST 1 VIEW COMPARISON:  None Available. FINDINGS: Wireless device overlies the left chest. Stable cardiomediastinal contours. Aortic atherosclerosis. No change in bilateral pleural effusions. Similar appearance of biapical pleuroparenchymal scarring with calcifications. Increased reticular interstitial markings are noted throughout both lungs. Allowing for differences in technique the appearance is overall unchanged. IMPRESSION: No change in  bilateral pleural effusions. Diffuse increased reticular interstitial opacities and biapical scarring appears stable. Electronically Signed   By: Signa Kell M.D.   On: 11/02/2022 09:22   [place] EKG SITE RITE  Result Date: 11/02/2022 If Site Rite image not attached, placement could not be confirmed due to current cardiac rhythm.  ASSESSMENT AND PLAN:   82 y.o. 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 treated with tPA and mechanical thrombectomy, discharged on aspirin and atorvastatin, noted to have intermittent narrow complex tachycardia treated as paroxysmal SVT with metoprolol and placed on Zio patch with cardiology f admitted status post fall at rehab in which she sustained multiple skin tears and a facial contusion   Principal Problem:   Atrial fibrillation with rapid ventricular response (HCC) Active Problems:   Acute respiratory failure with hypoxia (HCC)   Hypotension   Ischemic stroke 10/24/22 s/p tPA and mechanical thrombectomy   Anemia   Chronic diastolic CHF (congestive heart failure) (HCC)   Multiple bruises   Facial contusion, initial encounter   Fall at home, initial encounter   Stage 3a chronic kidney disease (HCC)   Rheumatoid arthritis (HCC)   Vascular dementia, unspecified severity, with anxiety (HCC)   Hypertension   Goals of care, counseling/discussion  * Atrial fibrillation with rapid ventricular response (HCC) Recent paroxysmal SVT Patient had narrow complex tachycardia while at [facility] 7/26-8/1 which was treated as SVT versus A-fib Amiodarone stopped.  Continue low-dose metoprolol 12.5 mg twice daily Not a candidate for systemic anticoagulation at this time due to recent CVA treated with tPA Cardiology seen and appreciate input Patient had echocardiogram during recent hospitalization at [facility]  from 7/26 to 8/1   Hypotension History of essential hypertension Very borderline BP, with systolic as low as 80s  Hold home antihypertensives  As needed fluid boluses. Added midodrine and NS @ 75   Acute respiratory failure with hypoxia (HCC) O2 sat dropped to 87% in the ED, in the setting of rapid A-fib Continue O2 to keep sats over 94%.  Currently on room air   Ischemic stroke 10/24/22 s/p tPA and mechanical thrombectomy Currently on aspirin and atorvastatin   Anemia Hemoglobin 7.9, stable from discharge at 7.9 Continue to monitor   Facial contusion, initial encounter Multiple bruises and skin tears Possible parotid gland injury on CT Fall at rehab CT showing possible parotid injury-soft tissue stranding around right parotid gland CT neck nonacute Cool compresses and pain control Wound care  Chronic diastolic CHF (congestive heart failure) (HCC) Clinically euvolemic Continue low-dose metoprolol   Stage 3a chronic kidney disease (HCC) Renal function at baseline around 1.53   Rheumatoid arthritis (HCC) Continue Plaquenil and prednisone   Vascular dementia, unspecified severity, with anxiety (HCC) Continue mirtazapine and Namenda  Family is considering hospice at home and keep her comfortable Body mass index is 25.57 kg/m.  Net IO Since Admission: 2,550.9 mL [11/02/22 2255]      LOS: 2 days   Consultants: Cardiology Palliative care   Status is: Inpatient Remains inpatient appropriate because: Management of A-fib with RVR   DVT prophylaxis:       SCDs Start: 10/31/22 2325     Family Communication: Updated daughters at bedside   All the records are reviewed and case discussed with Nursing and [facility] team. Management plans discussed with the patient, family/daughter and they are in agreement.  CODE STATUS: DNR Level of care: Med-Surg  TOTAL TIME TAKING CARE OF THIS PATIENT: 35 minutes.   More than 50% of the time was spent in counseling/coordination  of care: YES  POSSIBLE D/C IN 1-2 DAYS, DEPENDING ON CLINICAL CONDITION."  Coordination of hospice and delivery of hospital bed   Delfino Lovett M.D on 11/02/2022 at 10:55 PM  [facility]   CC: Primary care physician; [name], MD  Note: This dictation was prepared with Dragon dictation along with smaller phrase technology. Any transcriptional errors that result from this process are unintentional.

## 2022-11-02 NOTE — Progress Notes (Signed)
Speech Language Pathology Treatment: Dysphagia  Patient Details Name: Carol Kent MRN: [id number] DOB: November 30, 1940 Today's Date: 11/02/2022 Time: 3086-5784 SLP Time Calculation (min) (ACUTE ONLY): 18 min  Assessment / Plan / Recommendation Clinical Impression  Pt seen for diet tolerance which was limited in nature as pt declined solid trials outside of meds crushed in applesauce as administered by RN. Per RN, pt with poor PO intake. Pt alert, oriented to self, confused, and distractible. Repeatedly asking "where's Mama?" On 2L/min O2 via [place]. Baseline congested/wet cough appreciated which did not appear to clinically correlate with oropharyngeal swallow function. Pt with s/sx oral dysphagia c/b inability to siphon liquids via straw due to reduced labial seal. Oral deficits likely exacerbated by AMS. Oral phase was functional with meds crushed in applesauce as administered by RN. No overt s/sx pharyngeal dysphagia; however, pt is at increased risk for due AMS, multiple comorbidities (including recent stroke and respiratory status), dental status and dependence for feeding at present. Concern for pt's ability to meet nutritional needs orally. Palliative Care following. SLP to f/u for diet tolerance and trials of upgraded textures as appropriate.   HPI HPI: Per admitting H&P "Carol Kent is a 82 y.o. 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  Patient previously had several bruises  following tPA administration at [facility].  She is at her baseline confusion with no new deficits.  ED course and data review: On arrival she was noted to be in rapid A-fib with rate in the 140s to 150s with blood pressure borderline low at 103/68.  She was treated with a small Dilt bolus of 5 mg with hypotensive response to 98/57.  During treatment she became hypoxic to 87% and was placed on O2 at 2 L.  The ED provider spoke with cardiology who recommended amiodarone bolus and infusion.  Heart rate improved to the 120s on amiodarone infusion with BP of 115/61 by admission.  She was not started on anticoagulation due to recent CVA on 7/26 treated with tPA  Additional ED workup: Labs at baseline with hemoglobin 7.9, stable from discharge on 8/1, creatinine 1.53 slightly up from 1.43 at recent discharge.  Troponin 41, urinalysis clean.  Chest x-ray showed likely trace bilateral pleural effusion  Right shoulder x-ray negative for acute injury  CT head nonacute but showing soft tissue stranding around the right parotid gland--possible parotid injury  CT C-spine nontraumatic  Hospitalist consulted for admission.      SLP Plan  Continue with current plan of care      Recommendations for follow up therapy are one component of a multi-disciplinary discharge planning process, led by the attending physician.  Recommendations may be updated based on patient status, additional functional criteria and insurance authorization.    Recommendations  Diet recommendations: Dysphagia 2 (fine chop);Thin liquid Liquids provided via: Cup;No straw Medication Administration: Crushed with puree Supervision: Staff to assist with self feeding;Full supervision/cueing for compensatory strategies Compensations: Slow rate;Small sips/bites;Follow solids with liquid Postural Changes and/or Swallow Maneuvers: Seated upright 90 degrees;Upright 30-60  min after meal                 (Palliative Care following) Oral care BID    Frequent or constant Supervision/Assistance Dysphagia, oropharyngeal phase (R13.12)     Continue with current plan of care    Clyde Canterbury, M.S., CCC-SLP Speech-Language Pathologist [facility] [phone] (ASCOM)  Woodroe Chen  11/02/2022, 9:30 AM

## 2022-11-02 NOTE — Progress Notes (Signed)
PHARMACY CONSULT NOTE - FOLLOW UP  Pharmacy Consult for Electrolyte Monitoring and Replacement   Recent Labs: Potassium (mmol/L)  Date Value  11/02/2022 3.1 (L)   Magnesium (mg/dL)  Date Value  62/13/0865 1.8   Calcium (mg/dL)  Date Value  78/46/9629 7.6 (L)   Albumin (g/dL)  Date Value  52/84/1324 2.6 (L)   Phosphorus (mg/dL)  Date Value  40/12/2723 2.5   Sodium (mmol/L)  Date Value  11/02/2022 139   Assessment: 82 y/o female with PMH of HTN, afib (no AC), HLD, CKD III, RA, recent stroke (09/2022) and cognitive impairment admitted for fall at rehab and currently being treated for recent paroxysmal SVT. Pharmacy has been consulted to replace electrolytes.  Goal of Therapy:  Potassium ? 3.5 Magnesium ? 1.9 Phosphate ? 2.4 Corrected Calcium ? 8.0  Plan:  K = 3.1; Kcl po x 1 ordered by MD No additional replacement needed this AM Will follow up AM labs and replace as needed   Rodriguez-Guzman PharmD, BCPS 11/02/2022 8:53 AM

## 2022-11-02 NOTE — Progress Notes (Signed)
Daily Progress Note   Patient Name: Carol Kent       Date: 11/02/2022 DOB: 05-10-1940  Age: 82 y.o. MRN#: [id number] Attending Physician: Delfino Lovett, MD Primary Care Physician: [name], MD Admit Date: 10/31/2022  Reason for Consultation/Follow-up: Establishing goals of care  HPI/Brief Hospital Review: 82 y.o. female  with past medical history of HTN, HLD, RA, CKD, cognitive impairment and CHF admitted from [facility] on 10/31/2022 with unwitnessed fall resulting in facial skin tears and c/o right shoulder pain.   Found to be in A. Fib RVR in ED-received diltiazem resulting in hypotension, bloused and started on amiodarone infusion-rate improved   Shoulder xray negative for acute process   Noted multiple hospitalizations since beginning of July   7/7-7/10 admitted for fall, found to have UTI-contributing to acute delirium with underlying cognitive impairment-d/c to [facility] at that time   7/26-8/1 admission to [facility] for A. Fib/CVA-treated with tPA and mechanical thrombectomy, again d/c'd to [facility]   Palliative Medicine consulted for assisting with goals of care conversations.  Subjective: Extensive chart review has been completed prior to meeting patient including labs, vital signs, imaging, progress notes, orders, and available advanced directive documents from current and previous encounters.    Visited with Carol Kent at her bedside. Awake, alert, disoriented and unable to engage in GOC conversations. Nursing staff at bedside, reviewed overnight events-acute hypoxia resolved with [place], agitation/delirium resulting in need of bedside sitter.  Both daughters present during time of visit. Approached continuing goals of care conversations. Shared concerns related to likely  ongoing decline and overall poor prognosis related to underlying dementia with recent concerns related to poor or minimal PO intake, CHF, recent CVA and new A. Fib.  Introduced and reviewed overall philosophy of hospice/comfort approach. Carol Kent would no longer receive aggressive medical interventions such as continuous vital signs, lab work, radiology testing, or medications not focused on comfort. All care would focus on how the patient is looking and feeling. This would include management of any symptoms that may cause discomfort, pain, shortness of breath, cough, nausea, agitation, anxiety, and/or secretions etc. Symptoms would be managed with medications such as morphine/lorazepam/Haldol or other similar medications and other non-pharmacological interventions such as spiritual support if requested, repositioning, music therapy, or therapeutic listening. Family verbalized understanding and appreciation. Family voiced concern  for listed "unknown" allergy to morphine-daughters also unaware of specific reaction as Morphine had been listed for many years as an allergy.  We discussed several different local agencies available to be offered as a choice to them-shared one agency offering IPU if needed in the future for aggressive symptom management that may possibly be needed and unable to managed in the home-worsening agitation related to dementia and delirium (multiple hospitalizations).  Primary team joined conversations, I left to join a previously scheduled family meeting with different patient.  Returned to bedside as shared with family to follow-up on conversation, family again requesting choices offered-TOC made aware.  Primary team managing comfort orders and medication management.  Care plan was discussed with nursing staff, primary team and TOC.  Thank you for allowing the Palliative Medicine Team to assist in the care of this patient.  Total time:  50 minutes  Time spent includes:  Detailed review of medical records (labs, imaging, vital signs), medically appropriate exam (mental status, respiratory, cardiac, skin), discussed with treatment team, counseling and educating patient, family and staff, documenting clinical information, medication management and coordination of care.  Leeanne Deed, DNP, AGNP-C Palliative Medicine   Please contact Palliative Medicine Team phone at [phone] for questions and concerns.

## 2022-11-02 NOTE — Progress Notes (Signed)
Pts IV leaking on assessment this AM. PICC line ordered. IV team consulted for IV access while awaiting PICC line. Unable to run amio gtt without access at this time. MD aware. Afib rate 114-130. BP 101/87 at this time. Will continue to monitor.

## 2022-11-02 NOTE — Progress Notes (Signed)
Received phone call from central tele that patient O2 saturations were reading in the low to mid 80s, with acceptable waveform. On assessment, confirmed O2 saturation of 85%. 2L [place] placed on patient and MD notified. Patient has BUE weeping and +2 BLE edema. Congested, NP cough. IVF infusing at 75/hr for BP support. Lab unable to obtain blood sample for ordered labs. Awaiting further orders.

## 2022-11-03 DIAGNOSIS — W19XXXA Unspecified fall, initial encounter: Secondary | ICD-10-CM | POA: Diagnosis not present

## 2022-11-03 DIAGNOSIS — Z515 Encounter for palliative care: Secondary | ICD-10-CM | POA: Diagnosis not present

## 2022-11-03 DIAGNOSIS — I4891 Unspecified atrial fibrillation: Secondary | ICD-10-CM | POA: Diagnosis not present

## 2022-11-03 LAB — BASIC METABOLIC PANEL
Anion gap: 12 (ref 5–15)
BUN: 31 mg/dL — ABNORMAL HIGH (ref 8–23)
CO2: 16 mmol/L — ABNORMAL LOW (ref 22–32)
Calcium: 8.2 mg/dL — ABNORMAL LOW (ref 8.9–10.3)
Chloride: 112 mmol/L — ABNORMAL HIGH (ref 98–111)
Creatinine, Ser: 1.56 mg/dL — ABNORMAL HIGH (ref 0.44–1.00)
GFR, Estimated: 33 mL/min — ABNORMAL LOW (ref >60)
Glucose, Bld: 90 mg/dL (ref 70–99)
Potassium: 4 mmol/L (ref 3.5–5.1)
Sodium: 140 mmol/L (ref 135–145)

## 2022-11-03 MED ORDER — GUAIFENESIN-DM 100-10 MG/5ML PO SYRP
5.0000 mL | ORAL_SOLUTION | ORAL | Status: DC | PRN
  Administered 2022-11-03: 5 mL via ORAL
  Filled 2022-11-03: qty 10

## 2022-11-03 MED ORDER — HALOPERIDOL 5 MG PO TABS: 5.0000 mg | ORAL_TABLET | ORAL | 0 refills | Status: DC | PRN

## 2022-11-03 MED ORDER — SENNOSIDES 8.6 MG PO TABS: 2.0000 | ORAL_TABLET | Freq: Two times a day (BID) | ORAL | 0 refills | Status: DC

## 2022-11-03 MED ORDER — MEMANTINE HCL 10 MG PO TABS: 10.0000 mg | ORAL_TABLET | Freq: Every day | ORAL | Status: DC

## 2022-11-03 MED ORDER — RISPERIDONE 0.5 MG PO TBDP: 0.5000 mg | ORAL_TABLET | Freq: Two times a day (BID) | ORAL | Status: DC

## 2022-11-03 MED ORDER — IBUPROFEN 400 MG PO TABS: 400.0000 mg | ORAL_TABLET | Freq: Three times a day (TID) | ORAL | 0 refills | Status: AC | PRN

## 2022-11-03 MED ORDER — HYOSCYAMINE SULFATE 0.125 MG SL SUBL: 0.1250 mg | SUBLINGUAL_TABLET | SUBLINGUAL | 0 refills | Status: DC | PRN

## 2022-11-03 MED ORDER — LORAZEPAM 0.5 MG PO TABS: 0.5000 mg | ORAL_TABLET | ORAL | 0 refills | Status: DC | PRN

## 2022-11-03 MED ORDER — RISPERIDONE 0.5 MG PO TBDP: 0.5000 mg | ORAL_TABLET | Freq: Two times a day (BID) | ORAL | 0 refills | Status: AC

## 2022-11-03 NOTE — TOC Progression Note (Signed)
Transition of Care [facility]) - Progression Note    Patient Details  Name: Carol Kent MRN: [id number] Date of Birth: 25-May-1940  Transition of Care [facility]) CM/SW Contact  Garret Reddish, RN Phone Number: 11/03/2022, 10:13 AM  Clinical Narrative:   Chart reviewed.  I have spoke with patient's daughter Kandra Nicolas.  She informs me that she has chosen to use [employer].  [name] reports that she has spoken with Dahlia Client from [facility] and they are working on ordering a hospital bed for home use.  [name] reports that she has arranged transportation for [name] once she is able to discharge.  [name] reports will need hospital bed prior to discharge.    I have spoken with Dahlia Client [facility] Coordinator.  She reports that she has placed an urgent request for the hospital bed.  Dahlia Client also informs me that she will need an order for Home Hospice.  She has requested Discharge Summary and Discharge orders to be faxed to [phone].  Hannah's contact number is [phone].  TOC will continue to follow for discharge planning.             Expected Discharge Plan and Services                                               Social Determinants of Health (SDOH) Interventions SDOH Screenings   Food Insecurity: Patient Declined (11/01/2022)  Housing: Patient Declined (11/01/2022)  Transportation Needs: Patient Declined (11/01/2022)  Utilities: Patient Declined (11/01/2022)  Financial Resource Strain: Low Risk  (10/29/2022)   Received from [facility]  Tobacco Use: Low Risk  (11/01/2022)  Recent Concern: Tobacco Use - Medium Risk (09/04/2022)   Received from [facility], [facility]    Readmission Risk Interventions    11/05/2020    1:56 PM  Readmission Risk Prevention Plan  Transportation Screening Complete  PCP or Specialist Appt within 3-5 Days Complete  HRI or Home Care Consult Complete  Social Work Consult for  Recovery Care Planning/Counseling Complete  Palliative Care Screening Not Applicable  Medication Review [employer]ographer) Complete

## 2022-11-03 NOTE — Progress Notes (Signed)
Nutrition Brief Note  Chart reviewed. Pt now transitioning to comfort care. Plan to d/c home with hospice today.  No further nutrition interventions planned at this time.  Please re-consult as needed.   Loistine Chance, RD, LDN, [place] Registered Dietitian II Certified Diabetes Care and Education Specialist Please refer to [facility] for RD and/or RD on-call/weekend/after hours pager

## 2022-11-03 NOTE — TOC Transition Note (Signed)
Transition of Care [facility]) - CM/SW Discharge Note   Patient Details  Name: Carol Kent MRN: [id number] Date of Birth: 1940/04/22  Transition of Care [facility]) CM/SW Contact:  Garret Reddish, RN Phone Number: 11/03/2022, 2:48 PM   Clinical Narrative:   Chart reviewed.  Noted that patient will be discharging today with [facility].    I have spoken with Dahlia Client with [facility].  She informs me that she has arranged for patient to have a hospital bed, and home 02 for home use.    [name] has arranged for [facility] ambulance to transport patient home today.    Once hospital bed and home o2 have been delivered patient is able to discharge home today with support of family and Home Hospice services.    Dahlia Client reports that she will need a discharge summary faxed to [phone].  I have informed staff nurse of the above information.      Final next level of care: Home w Hospice Care [facility]) Barriers to Discharge: No Barriers Identified   Patient Goals and CMS Choice CMS Medicare.gov Compare Post Acute Care list provided to:: Patient Represenative (must comment) ([name] [name]) Choice offered to / list presented to : Adult Children  Discharge Placement                  Patient to be transferred to facility by: [facility] ( patient's daughter has arranged transportation with [facility] service. Name of family member notified: [name] Patient and family notified of of transfer: 11/03/22  Discharge Plan and Services Additional resources added to the After Visit Summary for                          Representative spoke with at DME Agency:  [facility] will arrange Hospital bed and home 02)   HH Agency:  ([facility]) Date [facility] Agency Contacted: 11/03/22 Time HH Agency Contacted: 1000 Representative spoke with at [facility] Agency: Dahlia Client  Social Determinants of Health (SDOH) Interventions SDOH Screenings   Food Insecurity:  Patient Declined (11/01/2022)  Housing: Patient Declined (11/01/2022)  Transportation Needs: Patient Declined (11/01/2022)  Utilities: Patient Declined (11/01/2022)  Financial Resource Strain: Low Risk  (10/29/2022)   [facility] from [facility]  Tobacco Use: Low Risk  (11/01/2022)  Recent Concern: Tobacco Use - Medium Risk (09/04/2022)   [facility], [facility]     Readmission Risk Interventions    11/05/2020    1:56 PM  Readmission Risk Prevention Plan  Transportation Screening Complete  PCP or Specialist Appt within 3-5 Days Complete  HRI or Home Care Consult Complete  Social Work Consult for Recovery Care Planning/Counseling Complete  Palliative Care Screening Not Applicable  Medication Review [employer]) Complete

## 2022-11-03 NOTE — Plan of Care (Signed)
  Problem: Education: Goal: Knowledge of General Education information will improve Description: Including pain rating scale, medication(s)/side effects and non-pharmacologic comfort measures Outcome: Progressing   Problem: Clinical Measurements: Goal: Ability to maintain clinical measurements within normal limits will improve Outcome: Progressing   Problem: Activity: Goal: Risk for activity intolerance will decrease Outcome: Progressing   Problem: Nutrition: Goal: Adequate nutrition will be maintained Outcome: Progressing   Problem: Coping: Goal: Level of anxiety will decrease Outcome: Progressing   Problem: Elimination: Goal: Will not experience complications related to bowel motility Outcome: Progressing   Problem: Pain Managment: Goal: General experience of comfort will improve Outcome: Progressing   Problem: Safety: Goal: Ability to remain free from injury will improve Outcome: Progressing   Problem: Skin Integrity: Goal: Risk for impaired skin integrity will decrease Outcome: Progressing   Problem: Activity: Goal: Ability to tolerate increased activity will improve Outcome: Progressing

## 2022-11-03 NOTE — Care Management Important Message (Signed)
Important Message  Patient Details  Name: Carol Kent MRN: [id number] Date of Birth: Nov 19, 1940   Medicare Important Message Given:  Other (see comment)  Discharging with hospice services.  Medicare IM withheld at this time out of respect for patient and family.   [name] 11/03/2022, 11:39 AM

## 2022-11-03 NOTE — Progress Notes (Signed)
Patient being discharged home, all belongings sent with patient and daughter. All discharge education provided to daughter.

## 2022-11-03 NOTE — Progress Notes (Signed)
PHARMACY CONSULT NOTE - FOLLOW UP  Pharmacy Consult for Electrolyte Monitoring and Replacement   Recent Labs: Potassium (mmol/L)  Date Value  11/03/2022 4.0   Magnesium (mg/dL)  Date Value  82/95/6213 1.8   Calcium (mg/dL)  Date Value  08/65/7846 8.2 (L)   Albumin (g/dL)  Date Value  96/29/5284 2.6 (L)   Phosphorus (mg/dL)  Date Value  13/24/4010 2.5   Sodium (mmol/L)  Date Value  11/03/2022 140   Assessment: 82 y/o female with PMH of HTN, afib (no AC), HLD, CKD III, RA, recent stroke (09/2022) and cognitive impairment admitted for fall at rehab and currently being treated for recent paroxysmal SVT. Pharmacy has been consulted to replace electrolytes.  Goal of Therapy:  Potassium ? 3.5 Magnesium ? 1.9 Phosphate ? 2.4 Corrected Calcium ? 8.0  Plan:  No replacement warranted Will follow up AM labs and replace as needed    Elliot Gurney, PharmD, BCPS Clinical Pharmacist  11/03/2022 10:17 AM

## 2022-11-03 NOTE — Discharge Summary (Signed)
Physician Discharge Summary   Patient: Carol Kent MRN: [id number] DOB: 1940/06/06  Admit date:     10/31/2022  Discharge date: 11/03/22  Discharge Physician: Delfino Lovett   PCP: [name], MD   Recommendations at discharge:    Hospice at Home  Discharge Diagnoses: Principal Problem:   Atrial fibrillation with rapid ventricular response [facility]) Active Problems:   Acute respiratory failure with hypoxia (HCC)   Hypotension   Ischemic stroke 10/24/22 s/p tPA and mechanical thrombectomy   Anemia   Chronic diastolic CHF (congestive heart failure) (HCC)   Multiple bruises   Facial contusion, initial encounter   Fall at home, initial encounter   Stage 3a chronic kidney disease (HCC)   Rheumatoid arthritis (HCC)   Vascular dementia, unspecified severity, with anxiety (HCC)   Hypertension   Hospice care  Hospital Course:  Assessment and Plan:  82 y.o. 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  Continue low-dose metoprolol 12.5 mg twice daily Not a candidate for systemic anticoagulation at this time due to recent CVA treated with tPA Patient had echocardiogram during recent hospitalization at [facility] from 7/26 to 8/1   Hypotension History of essential hypertension Hold home  antihypertensives    Acute respiratory failure with hypoxia (HCC) 2 liter O2 at DC   Ischemic stroke 10/24/22 s/p tPA and mechanical thrombectomy Currently on aspirin    Anemia stable   Facial contusion, initial encounter Multiple bruises and skin tears Possible parotid gland injury on CT Fall at rehab CT showing possible parotid injury-soft tissue stranding around right parotid gland CT neck nonacute Cool compresses and pain control   Chronic diastolic CHF (congestive heart failure) (HCC) Clinically euvolemic   Stage 3a chronic kidney disease (HCC) Renal function at baseline around 1.53   Rheumatoid arthritis (HCC) on Plaquenil and prednisone   Vascular dementia, unspecified severity, with anxiety (HCC) Continue mirtazapine and Namenda      Consultants: Palliative care Disposition: Hospice care Diet recommendation:  Discharge Diet Orders (From admission, onward)     Start     Ordered   11/03/22 0000  Diet - low sodium heart healthy        11/03/22 1059           Carb modified diet DISCHARGE MEDICATION: Allergies as of 11/03/2022       Reactions   Codeine Nausea Only   Other reaction(s): Nausea   Erythromycin Nausea Only   Fentanyl    Other reaction(s): Hallucination   Morphine And Codeine    Other reaction(s): Other (See Comments)   Orencia [abatacept]    Propoxyphene Nausea Only   Other reaction(s): Nausea   Sulfa Antibiotics    Other reaction(s): Nausea   Other Rash   Pain med - can not remember name   Penicillins Rash   Other reaction(s): Hives  Medication List     STOP taking these medications    atorvastatin 40 MG tablet Commonly known as: LIPITOR   cyanocobalamin 1000 MCG tablet   hydroxychloroquine 200 MG tablet Commonly known as: PLAQUENIL   magnesium hydroxide 400 MG/5ML suspension Commonly known as: MILK OF MAGNESIA   potassium chloride 10 MEQ tablet Commonly known as: KLOR-CON   vitamin C 1000 MG tablet        TAKE these medications    aspirin 81 MG chewable tablet Chew 81 mg by mouth daily.   barrier cream Crea Commonly known as: non-specified Apply 1 Application topically 3 (three) times daily.   bisacodyl 10 MG suppository Commonly known as: DULCOLAX Place 10 mg rectally daily as needed for moderate constipation. What changed: Another medication with the same name was removed. Continue taking this medication, and follow the directions you see here.   EPINEPHrine 0.3 mg/0.3 mL Soaj injection Commonly known as: EPI-PEN Inject 0.3 mg into the skin as directed.   ferrous sulfate 325 (65 FE) MG EC tablet Take 325 mg by mouth in the morning.   furosemide 40 MG tablet Commonly known as: Lasix Take 1 tablet by mouth 3 times a week. What changed:  how much to take how to take this when to take this additional instructions   haloperidol 5 MG tablet Commonly known as: HALDOL Take 1 tablet (5 mg total) by mouth every 4 (four) hours as needed for agitation. May crush, mix with water and give sublingually if needed.   hyoscyamine 0.125 MG SL tablet Commonly known as: LEVSIN SL Place 1 tablet (0.125 mg total) under the tongue every 4 (four) hours as needed (excess oral secretions).   ibuprofen 400 MG tablet Commonly known as: ADVIL Take 1 tablet (400 mg total) by mouth every 8 (eight) hours as needed for moderate pain, mild pain, headache, fever or cramping.   LORazepam 0.5 MG tablet Commonly known as: ATIVAN Take 1 tablet (0.5 mg total) by mouth every 4 (four) hours as needed for anxiety. May crush, mix with water and give sublingually if needed.   memantine 10 MG tablet Commonly known as: NAMENDA Take 1 tablet (10 mg total) by mouth daily. What changed: when to take this   metoprolol tartrate 25 MG tablet Commonly known as: LOPRESSOR Take 25 mg by mouth 4 (four) times daily as needed (palpitations).   mirtazapine 7.5 MG tablet Commonly known as: REMERON Take 7.5 mg by  mouth at bedtime.   predniSONE 1 MG tablet Commonly known as: DELTASONE Take 6 mg by mouth daily with breakfast.   risperiDONE 0.5 MG disintegrating tablet Commonly known as: RISPERDAL M-TABS Take 1 tablet (0.5 mg total) by mouth 2 (two) times daily.   senna 8.6 MG tablet Commonly known as: Senokot Take 2 tablets (17.2 mg total) by mouth 2 (two) times daily. May crush, mix with water and give sublingually if needed.   Xeroform Petrolat Patch 4"x4" Pads Apply 1 patch topically daily.               Durable Medical Equipment  (From admission, onward)           Start     Ordered   11/02/22 1200  For home use only DME Specialty bed  Once       Comments: Hospice bed   11/02/22 1159              Discharge Care Instructions  (From admission, onward)  Start     Ordered   11/03/22 0000  Discharge wound care:       Comments: As above   11/03/22 1059            Discharge Exam: Filed Weights   10/31/22 1802 11/01/22 0000  Weight: 75.5 kg 69.7 kg   Constitutional:      Appearance: She is ill-appearing.  HENT:     Head: Normocephalic.     Nose: Nose normal.     Mouth/Throat:     Mouth: Mucous membranes are moist.  Eyes:     Extraocular Movements: Extraocular movements intact.     Conjunctiva/sclera: Conjunctivae normal.     Pupils: Pupils are equal, round, and reactive to light.  Cardiovascular:     Rate and Rhythm: Tachycardia present. Rhythm irregular.  Pulmonary:     Effort: Pulmonary effort is normal.     Breath sounds: Normal breath sounds.  Abdominal:     General: Bowel sounds are normal.     Palpations: Abdomen is soft.  Musculoskeletal:        General: Normal range of motion.     Cervical back: Normal range of motion.  Skin:    Findings: Bruising present.     Comments:Multiple bruises of different stages on all limbs, upper chest and right side of face  Neurological:     General: No focal deficit present.     Mental  Status: She is alert. Mental status is at baseline.  Psychiatric:        Mood and Affect: Mood normal.     Condition at discharge: poor  The results of significant diagnostics from this hospitalization (including imaging, microbiology, ancillary and laboratory) are listed below for reference.   Imaging Studies: DG Chest Port 1 View  Result Date: 11/02/2022 CLINICAL DATA:  Short of breath. EXAM: PORTABLE CHEST 1 VIEW COMPARISON:  None Available. FINDINGS: Wireless device overlies the left chest. Stable cardiomediastinal contours. Aortic atherosclerosis. No change in bilateral pleural effusions. Similar appearance of biapical pleuroparenchymal scarring with calcifications. Increased reticular interstitial markings are noted throughout both lungs. Allowing for differences in technique the appearance is overall unchanged. IMPRESSION: No change in bilateral pleural effusions. Diffuse increased reticular interstitial opacities and biapical scarring appears stable. Electronically Signed   By: Signa Kell M.D.   On: 11/02/2022 09:22   [place] EKG SITE RITE  Result Date: 11/02/2022 If Site Rite image not attached, placement could not be confirmed due to current cardiac rhythm.  DG Forearm Left  Result Date: 11/01/2022 CLINICAL DATA:  Trauma, fall, pain and swelling EXAM: LEFT FOREARM - 2 VIEW COMPARISON:  None Available. FINDINGS: No displaced fracture or dislocation is seen. There is subcutaneous stranding. There is soft tissue swelling along the dorsal aspect. IMPRESSION: No fracture or dislocation is seen in left forearm. Electronically Signed   By: Ernie Avena M.D.   On: 11/01/2022 18:24   DG Chest Portable 1 View  Result Date: 10/31/2022 CLINICAL DATA:  hypoxia EXAM: PORTABLE CHEST 1 VIEW COMPARISON:  Chest x-ray 10/05/2022 FINDINGS: Wireless device overlies left chest. Slightly more prominent cardiac silhouette. Wise the heart and mediastinal contours are unchanged. Aortic calcification. No  focal consolidation. Chronic coarsened interstitial markings with no overt pulmonary edema. Likely trace bilateral pleural effusion. No pneumothorax. No acute osseous abnormality. IMPRESSION: 1. Slightly more prominent cardiac silhouette with underlying pericardial effusion not excluded. 2. Likely trace bilateral pleural effusion. 3. Aortic Atherosclerosis (ICD10-I70.0). Electronically Signed   By: Normajean Glasgow.D.  On: 10/31/2022 20:46   DG Shoulder Right  Result Date: 10/31/2022 CLINICAL DATA:  Unwitnessed fall, right shoulder injury EXAM: RIGHT SHOULDER - 2+ VIEW COMPARISON:  None Available. FINDINGS: There is no evidence of fracture or dislocation. There is no evidence of arthropathy or other focal bone abnormality. Soft tissues are unremarkable. IMPRESSION: Negative. Electronically Signed   By: Helyn Numbers M.D.   On: 10/31/2022 19:04   CT Head Wo Contrast  Result Date: 10/31/2022 CLINICAL DATA:  fall EXAM: CT HEAD WITHOUT CONTRAST CT CERVICAL SPINE WITHOUT CONTRAST TECHNIQUE: Multidetector CT imaging of the head and cervical spine was performed following the standard protocol without intravenous contrast. Multiplanar CT image reconstructions of the cervical spine were also generated. RADIATION DOSE REDUCTION: This exam was performed according to the departmental dose-optimization program which includes automated exposure control, adjustment of the mA and/or kV according to patient size and/or use of iterative reconstruction technique. COMPARISON:  CT head and C Spine 10/05/22 FINDINGS: CT HEAD FINDINGS Brain: Encephalomalacia in the right anterior temporal lobe, likely postsurgical. No hemorrhage. No hydrocephalus. No extra-axial fluid collection. No CT evidence of an acute cortical infarct. Vascular: No hyperdense vessel or unexpected calcification. Skull: There are postsurgical changes from a right pterional craniotomy. Sinuses/Orbits: No middle ear or mastoid effusion. Paranasal sinuses are  clear. Bilateral lens replacement. Orbits are otherwise unremarkable. Other: None. CT CERVICAL SPINE FINDINGS Alignment: Normal. Skull base and vertebrae: No acute fracture. No primary bone lesion or focal pathologic process. Unchanged appearance of the superior endplate of C7, T2, and T3. Soft tissues and spinal canal: No prevertebral fluid or swelling. No visible canal hematoma. Disc levels:  No evidence of high-grade spinal canal stenosis. Upper chest: Small right pleural effusion, increased prior exam. Other: Asymmetric soft tissue stranding surrounding the right parotid gland with an overlying skin defect. Findings could be seen in the setting of a parotid injury. IMPRESSION: 1. No acute intracranial abnormality. 2. No acute cervical spine fracture or traumatic listhesis. 3. Asymmetric soft tissue stranding surrounding the right parotid gland with an overlying skin defect. Findings could be seen in the setting of a parotid injury. 4. Small right pleural effusion, increased from prior exam. Electronically Signed   By: Lorenza Cambridge M.D.   On: 10/31/2022 18:57   CT Cervical Spine Wo Contrast  Result Date: 10/31/2022 CLINICAL DATA:  fall EXAM: CT HEAD WITHOUT CONTRAST CT CERVICAL SPINE WITHOUT CONTRAST TECHNIQUE: Multidetector CT imaging of the head and cervical spine was performed following the standard protocol without intravenous contrast. Multiplanar CT image reconstructions of the cervical spine were also generated. RADIATION DOSE REDUCTION: This exam was performed according to the departmental dose-optimization program which includes automated exposure control, adjustment of the mA and/or kV according to patient size and/or use of iterative reconstruction technique. COMPARISON:  CT head and C Spine 10/05/22 FINDINGS: CT HEAD FINDINGS Brain: Encephalomalacia in the right anterior temporal lobe, likely postsurgical. No hemorrhage. No hydrocephalus. No extra-axial fluid collection. No CT evidence of an acute  cortical infarct. Vascular: No hyperdense vessel or unexpected calcification. Skull: There are postsurgical changes from a right pterional craniotomy. Sinuses/Orbits: No middle ear or mastoid effusion. Paranasal sinuses are clear. Bilateral lens replacement. Orbits are otherwise unremarkable. Other: None. CT CERVICAL SPINE FINDINGS Alignment: Normal. Skull base and vertebrae: No acute fracture. No primary bone lesion or focal pathologic process. Unchanged appearance of the superior endplate of C7, T2, and T3. Soft tissues and spinal canal: No prevertebral fluid or swelling. No visible canal hematoma.  Disc levels:  No evidence of high-grade spinal canal stenosis. Upper chest: Small right pleural effusion, increased prior exam. Other: Asymmetric soft tissue stranding surrounding the right parotid gland with an overlying skin defect. Findings could be seen in the setting of a parotid injury. IMPRESSION: 1. No acute intracranial abnormality. 2. No acute cervical spine fracture or traumatic listhesis. 3. Asymmetric soft tissue stranding surrounding the right parotid gland with an overlying skin defect. Findings could be seen in the setting of a parotid injury. 4. Small right pleural effusion, increased from prior exam. Electronically Signed   By: Lorenza Cambridge M.D.   On: 10/31/2022 18:57   MR BRAIN WO CONTRAST  Result Date: 10/07/2022 CLINICAL DATA:  Encephalopathy, dysarthria. EXAM: MRI HEAD WITHOUT CONTRAST TECHNIQUE: Multiplanar, multiecho pulse sequences of the brain and surrounding structures were obtained without intravenous contrast. COMPARISON:  No prior MRI available, correlation is made with 10/05/2022 CT head FINDINGS: Evaluation is somewhat limited by motion artifact. Brain: No restricted diffusion to suggest acute or subacute infarct. No acute hemorrhage, mass, mass effect, or midline shift. No hydrocephalus or extra-axial collection. Normal craniocervical junction. Encephalomalacia in the right anterior  temporal lobe and right frontal operculum. Confluent T2 hyperintense signal in the periventricular white matter, likely the sequela of moderate chronic small vessel ischemic disease. Age related cerebral atrophy. 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 Per a CTA head and neck report in Care Everywhere, this was present in 2017. Otherwise normal arterial flow voids. Skull and upper cervical spine: Prior right pterional craniotomy. Otherwise normal marrow signal. Sinuses/Orbits: No acute finding. Other: Trace fluid in the mastoid air cells. IMPRESSION: Evaluation is somewhat limited by motion. Within this limitation, no acute intracranial process. No evidence of acute or subacute infarct. Electronically Signed   By: Wiliam Ke M.D.   On: 10/07/2022 03:29   DG Knee 1-2 Views Right  Result Date: 10/06/2022 CLINICAL DATA:  Fall.  Right knee pain. EXAM: RIGHT KNEE - 1-2 VIEW COMPARISON:  Right tibia and fibula radiographs 06/22/2022 FINDINGS: Mild medial compartment joint space narrowing. Mild chronic enthesopathic change at the quadriceps insertion on the patella, similar to prior. No joint effusion. There is patient's clothing causes overlying artifact, including over the posterior proximal aspect of the tibia on lateral view. Density in this region is favored to be secondary to the patient's clothing. No definitive acute fracture is seen, although evaluation of the proximal posterior tibia is limited on this lateral view. Mild-to-moderate atherosclerotic calcifications. IMPRESSION: 1. Mild medial compartment joint space narrowing. 2. No definitive acute fracture is seen, although evaluation of the proximal posterior tibia is limited on lateral view due to overlying density that is favored to represent the patient's clothing. Electronically Signed   By: Neita Garnet M.D.   On: 10/06/2022 17:18   US Venous Img Lower Bilateral (DVT)  Result Date: 10/05/2022 CLINICAL DATA:  [id number] Lower extremity edema  [id number] EXAM: BILATERAL LOWER EXTREMITY VENOUS DOPPLER ULTRASOUND TECHNIQUE: Gray-scale sonography with graded compression, as well as color Doppler and duplex ultrasound were performed to evaluate the lower extremity deep venous systems from the level of the common femoral vein and including the common femoral, femoral, profunda femoral, popliteal and calf veins including the posterior tibial, peroneal and gastrocnemius veins when visible. The superficial great saphenous vein was also interrogated. Spectral Doppler was utilized to evaluate flow at rest and with distal augmentation maneuvers in the common femoral, femoral and popliteal veins. COMPARISON:  LEFT lower extremity venous duplex, 05/07/2022.  CT AP, 02/17/2021. FINDINGS: RIGHT LOWER EXTREMITY VENOUS Normal compressibility of the RIGHT common femoral, superficial femoral, and popliteal veins, as well as the visualized calf veins. Visualized portions of profunda femoral vein and great saphenous vein unremarkable. No filling defects to suggest DVT on grayscale or color Doppler imaging. Doppler waveforms show normal direction of venous flow, normal respiratory plasticity and response to augmentation. OTHER No evidence of superficial thrombophlebitis or abnormal fluid collection. Limitations: none LEFT LOWER EXTREMITY VENOUS Normal compressibility of the LEFT common femoral, superficial femoral, and popliteal veins, as well as the visualized calf veins. Visualized portions of profunda femoral vein and great saphenous vein unremarkable. No filling defects to suggest DVT on grayscale or color Doppler imaging. Doppler waveforms show normal direction of venous flow, normal respiratory plasticity and response to augmentation. OTHER No evidence of superficial thrombophlebitis or abnormal fluid collection. Limitations: none IMPRESSION: No evidence of femoropopliteal DVT or superficial thrombophlebitis within either lower extremity. Roanna Banning, MD [facility] Electronically Signed   By: Roanna Banning M.D.   On: 10/05/2022 14:04   DG Chest Portable 1 View  Result Date: 10/05/2022 CLINICAL DATA:  Fall.  Dementia EXAM: PORTABLE CHEST 1 VIEW COMPARISON:  06/24/2022 FINDINGS: Normal heart size for rotation and portable technique. Generalized interstitial opacity. No visible effusion or pneumothorax. Normal heart size and mediastinal contours. There is biapical pleural based scarring with calcification. IMPRESSION: Generalized interstitial opacity similar to 06/24/2022, either chronic lung disease or interstitial edema. Electronically Signed   By: Tiburcio Pea M.D.   On: 10/05/2022 05:41   DG Humerus Right  Result Date: 10/05/2022 CLINICAL DATA:  Fall with pain. EXAM: RIGHT HUMERUS - 2 VIEW COMPARISON:  None Available. FINDINGS: There is no evidence of fracture or other focal bone lesions. Subjective osteopenia. Partial coverage of the chest reported separately. IMPRESSION: Negative for fracture or dislocation of the humerus. Electronically Signed   By: Tiburcio Pea M.D.   On: 10/05/2022 05:39   CT HEAD WO CONTRAST ( )  Result Date: 10/05/2022 CLINICAL DATA:  Dementia with possible fall, found down outside. EXAM: CT HEAD WITHOUT CONTRAST CT CERVICAL SPINE WITHOUT CONTRAST TECHNIQUE: Multidetector CT imaging of the head and cervical spine was performed following the standard protocol without intravenous contrast. Multiplanar CT image reconstructions of the cervical spine were also generated. RADIATION DOSE REDUCTION: This exam was performed according to the departmental dose-optimization program which includes automated exposure control, adjustment of the mA and/or kV according to patient size and/or use of iterative reconstruction technique. COMPARISON:  06/22/2022 FINDINGS: CT HEAD FINDINGS Brain: No evidence of acute infarction, hemorrhage, hydrocephalus, extra-axial collection or mass lesion/mass  effect. Generalized brain atrophy. Encephalomalacia in the anterior right temporal lobe and frontal operculum beneath the craniotomy flap. Vascular: No hyperdense vessel or unexpected calcification. Skull: Remote right pterional craniotomy. Sinuses/Orbits: No evidence of injury CT CERVICAL SPINE FINDINGS Alignment: Normal. Skull base and vertebrae: Chronic T2 and T3 compression fractures with healed appearance. No evidence of acute fracture or aggressive bone lesion. Soft tissues and spinal canal: No prevertebral fluid or swelling. No visible canal hematoma. Disc levels:  Ordinary degenerative endplate spurring. Upper chest: Biapical reticulation, there is pending chest radiograph. IMPRESSION: No evidence of acute intracranial or cervical spine injury. Electronically Signed   By: Tiburcio Pea M.D.   On: 10/05/2022 05:22   CT Cervical Spine Wo Contrast  Result Date: 10/05/2022 CLINICAL DATA:  Dementia with possible fall, found down outside. EXAM: CT HEAD WITHOUT CONTRAST  CT CERVICAL SPINE WITHOUT CONTRAST TECHNIQUE: Multidetector CT imaging of the head and cervical spine was performed following the standard protocol without intravenous contrast. Multiplanar CT image reconstructions of the cervical spine were also generated. RADIATION DOSE REDUCTION: This exam was performed according to the departmental dose-optimization program which includes automated exposure control, adjustment of the mA and/or kV according to patient size and/or use of iterative reconstruction technique. COMPARISON:  06/22/2022 FINDINGS: CT HEAD FINDINGS Brain: No evidence of acute infarction, hemorrhage, hydrocephalus, extra-axial collection or mass lesion/mass effect. Generalized brain atrophy. Encephalomalacia in the anterior right temporal lobe and frontal operculum beneath the craniotomy flap. Vascular: No hyperdense vessel or unexpected calcification. Skull: Remote right pterional craniotomy. Sinuses/Orbits: No evidence of injury CT  CERVICAL SPINE FINDINGS Alignment: Normal. Skull base and vertebrae: Chronic T2 and T3 compression fractures with healed appearance. No evidence of acute fracture or aggressive bone lesion. Soft tissues and spinal canal: No prevertebral fluid or swelling. No visible canal hematoma. Disc levels:  Ordinary degenerative endplate spurring. Upper chest: Biapical reticulation, there is pending chest radiograph. IMPRESSION: No evidence of acute intracranial or cervical spine injury. Electronically Signed   By: Tiburcio Pea M.D.   On: 10/05/2022 05:22    Microbiology: Results for orders placed or performed during the hospital encounter of 10/31/22  SARS Coronavirus 2 by RT PCR (hospital order, performed in [facility] hospital lab) *cepheid single result test* Anterior Nasal Swab     Status: None   Collection Time: 10/31/22  9:00 PM   Specimen: Anterior Nasal Swab  Result Value Ref Range Status   SARS Coronavirus 2 by RT PCR NEGATIVE NEGATIVE Final    Comment: (NOTE) SARS-CoV-2 target nucleic acids are NOT DETECTED.  The SARS-CoV-2 RNA is generally detectable in upper and lower respiratory specimens during the acute phase of infection. The lowest concentration of SARS-CoV-2 viral copies this assay can detect is 250 copies / mL. A negative result does not preclude SARS-CoV-2 infection and should not be used as the sole basis for treatment or other patient management decisions.  A negative result may occur with improper specimen collection / handling, submission of specimen other than nasopharyngeal swab, presence of viral mutation(s) within the areas targeted by this assay, and inadequate number of viral copies (<250 copies / mL). A negative result must be combined with clinical observations, patient history, and epidemiological information.  Fact Sheet for Patients:   RoadLapTop.co.za  Fact Sheet for Healthcare  Providers: http://kim-miller.com/  This test is not yet approved or  cleared by the [place] FDA and has been authorized for detection and/or diagnosis of SARS-CoV-2 by FDA under an Emergency Use Authorization (EUA).  This EUA will remain in effect (meaning this test can be used) for the duration of the COVID-19 declaration under Section 564(b)(1) of the Act, 21 U.S.C. section 360bbb-3(b)(1), unless the authorization is terminated or revoked sooner.  Performed at [facility], [address] [place], [place] [address]   MRSA Next Gen by PCR, Nasal     Status: None   Collection Time: 11/01/22 12:25 AM   Specimen: Nasal Mucosa; Nasal Swab  Result Value Ref Range Status   MRSA by PCR Next Gen NOT DETECTED NOT DETECTED Final    Comment: (NOTE) The GeneXpert MRSA Assay (FDA approved for NASAL specimens only), is one component of a comprehensive MRSA colonization surveillance program. It is not intended to diagnose MRSA infection nor to guide or monitor treatment for MRSA infections. Test performance is not FDA  approved in patients less than 50 years old. Performed at [facility], [address] [place], [place] [address]     Labs: CBC: Recent Labs  Lab 10/31/22 1817  WBC 9.4  NEUTROABS 7.6  HGB 7.9*  HCT 23.9*  MCV 89.8  PLT 351   Basic Metabolic Panel: Recent Labs  Lab 10/31/22 1817 11/01/22 1335 11/02/22 0618 11/03/22 0900  NA 138  --  139 140  K 3.6  --  3.1* 4.0  CL 107  --  110 112*  CO2 20*  --  20* 16*  GLUCOSE 112*  --  90 90  BUN 26*  --  25* 31*  CREATININE 1.53*  --  1.35* 1.56*  CALCIUM 8.1*  --  7.6* 8.2*  MG  --  1.8  --   --   PHOS  --   --  2.5  --    Liver Function Tests: No results for input(s): "AST", "ALT", "ALKPHOS", "BILITOT", "PROT", "ALBUMIN" in the last 168 hours. CBG: Recent Labs  Lab 10/31/22 2345  GLUCAP 95    Discharge time spent: greater than 30 minutes.  Signed: Delfino Lovett,  MD [facility] 11/03/2022

## 2022-11-30 DEATH — deceased
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
## 2022-12-02 ENCOUNTER — Ambulatory Visit: Payer: Medicare Other | Admitting: Cardiovascular Disease

## 2022-12-02 DIAGNOSIS — I739 Peripheral vascular disease, unspecified: Secondary | ICD-10-CM

## 2022-12-02 DIAGNOSIS — R0602 Shortness of breath: Secondary | ICD-10-CM

## 2022-12-02 DIAGNOSIS — R000 Tachycardia, unspecified: Secondary | ICD-10-CM

## 2022-12-02 DIAGNOSIS — I5032 Chronic diastolic (congestive) heart failure: Secondary | ICD-10-CM

## 2022-12-02 DIAGNOSIS — N184 Chronic kidney disease, stage 4 (severe): Secondary | ICD-10-CM

## 2022-12-02 DIAGNOSIS — I10 Essential (primary) hypertension: Secondary | ICD-10-CM

## 2022-12-02 DIAGNOSIS — I6521 Occlusion and stenosis of right carotid artery: Secondary | ICD-10-CM

## 2022-12-02 DIAGNOSIS — R600 Localized edema: Secondary | ICD-10-CM
